# Patient Record
Sex: Male | Born: 1935 | Race: White | Hispanic: No | Smoking: Former smoker
Health system: Southern US, Community
[De-identification: ages and names within clinical notes are randomized; demographics above are authoritative.]

## PROBLEM LIST (undated history)

## (undated) DIAGNOSIS — E782 Mixed hyperlipidemia: Secondary | ICD-10-CM

## (undated) DIAGNOSIS — I10 Essential (primary) hypertension: Secondary | ICD-10-CM

## (undated) DIAGNOSIS — E119 Type 2 diabetes mellitus without complications: Secondary | ICD-10-CM

## (undated) DIAGNOSIS — N184 Chronic kidney disease, stage 4 (severe): Secondary | ICD-10-CM

## (undated) DIAGNOSIS — I350 Nonrheumatic aortic (valve) stenosis: Secondary | ICD-10-CM

## (undated) HISTORY — PX: NO PAST SURGERIES: SHX2092

---

## 2013-01-17 ENCOUNTER — Telehealth: Payer: Self-pay | Admitting: Endocrinology

## 2013-01-17 ENCOUNTER — Other Ambulatory Visit: Payer: Self-pay | Admitting: *Deleted

## 2013-01-17 MED ORDER — LORAZEPAM 1 MG PO TABS: ORAL_TABLET | ORAL | Status: DC

## 2013-01-17 NOTE — Telephone Encounter (Signed)
Dr. Dwyane Dee, okay to fill?

## 2013-01-17 NOTE — Telephone Encounter (Signed)
[facility] pt. Has been out of Lorazepam for several weeks. Wants refilled ASAP, really needs med. Takes 1mg  4 times per day. Uses [facility]. / Venida Jarvis

## 2013-01-17 NOTE — Telephone Encounter (Signed)
We can only give him a 30 day supply with a note to have followup visit since he has not been seen since 2/14

## 2013-01-23 ENCOUNTER — Other Ambulatory Visit: Payer: Self-pay | Admitting: Endocrinology

## 2013-07-07 ENCOUNTER — Ambulatory Visit: Payer: Self-pay | Admitting: Endocrinology

## 2013-07-22 ENCOUNTER — Ambulatory Visit (INDEPENDENT_AMBULATORY_CARE_PROVIDER_SITE_OTHER): Payer: 59 | Admitting: Endocrinology

## 2013-07-22 ENCOUNTER — Encounter: Payer: Self-pay | Admitting: Endocrinology

## 2013-07-22 VITALS — BP 168/66 | HR 65 | Temp 98.6°F | Resp 16 | Ht 71.0 in | Wt 261.8 lb

## 2013-07-22 DIAGNOSIS — E119 Type 2 diabetes mellitus without complications: Secondary | ICD-10-CM

## 2013-07-22 DIAGNOSIS — F332 Major depressive disorder, recurrent severe without psychotic features: Secondary | ICD-10-CM

## 2013-07-22 DIAGNOSIS — E785 Hyperlipidemia, unspecified: Secondary | ICD-10-CM

## 2013-07-22 DIAGNOSIS — N3943 Post-void dribbling: Secondary | ICD-10-CM

## 2013-07-22 DIAGNOSIS — E042 Nontoxic multinodular goiter: Secondary | ICD-10-CM

## 2013-07-22 DIAGNOSIS — I10 Essential (primary) hypertension: Secondary | ICD-10-CM

## 2013-07-22 DIAGNOSIS — F329 Major depressive disorder, single episode, unspecified: Secondary | ICD-10-CM

## 2013-07-22 LAB — URINALYSIS, ROUTINE W REFLEX MICROSCOPIC
Bilirubin Urine: NEGATIVE
KETONES UR: NEGATIVE
Leukocytes, UA: NEGATIVE
Nitrite: NEGATIVE
PH: 5.5 (ref 5.0–8.0)
Specific Gravity, Urine: >=1.03 — AB (ref 1.000–1.030)
TOTAL PROTEIN, URINE-UPE24: 100 — AB
Urine Glucose: NEGATIVE
Urobilinogen, UA: 0.2 (ref 0.0–1.0)
WBC, UA: NONE SEEN (ref 0–?)

## 2013-07-22 LAB — MICROALBUMIN / CREATININE URINE RATIO
Creatinine,U: 77.3 mg/dL
Microalb Creat Ratio: 227.1 mg/g — ABNORMAL HIGH (ref 0.0–30.0)
Microalb, Ur: 175.6 mg/dL — ABNORMAL HIGH (ref 0.0–1.9)

## 2013-07-22 MED ORDER — ESCITALOPRAM OXALATE 10 MG PO TABS: 10.0000 mg | ORAL_TABLET | Freq: Every day | ORAL | Status: DC

## 2013-07-22 MED ORDER — DOXAZOSIN MESYLATE 2 MG PO TABS: 2.0000 mg | ORAL_TABLET | Freq: Every day | ORAL | Status: DC

## 2013-07-22 NOTE — Progress Notes (Signed)
Patient ID: Jacorion Suma, male   DOB: 1935-01-02, 79 y.o.   MRN: [id number]   Reason for Appointment: Diabetes follow-up   History of Present Illness   Diagnosis: Type 2 DIABETES MELITUS, date of diagnosis: 1980      Previous history: In the past he has been on various regimens of oral hypoglycemic drugs including Amaryl, Januvia and Glucophage. Had stopped Januvia because of cost. His blood sugar control has usually been good with A1c usually below 6.5, detailed records are not available  Recent history: Not clear when he was switched from Amaryl to glipizide. Records from last year are not available except from PCP who has seen him in 12/14. He is now returning here for further management He is supposed to be taking metformin twice a day but is taking it only once a day in the morning Does not keep a record of his blood sugars but he thinks they are quite normal without symptoms of hypoglycemia.     Oral hypoglycemic drugs: Glipizide 5 mg in a.m., metformin 500 mg daily         Side effects from medications: None                Monitors blood glucose: Once a day.    Glucometer:  Accu-Chek           Blood Glucose readings from meter download: readings before breakfast: Mostly 70-110, did have one reading in the 60s without symptoms Hypoglycemia: Rare         Meals: 3 meals per day. Eating small portions           Physical activity: exercise: Occasional walking, limited by knee joint pain           Dietician visit: Most recent: Several years ago   Weight control: His weight in 2012 was 297  Wt Readings from Last 3 Encounters:  07/22/13 261 lb 12.8 oz (118.752 kg)         Complications: Neuropathy, proteinuria  Diabetes labs:  No results found for this basename: HGBA1C   No results found for this basename: GLUF,  MICROALBUR,  LDLCALC,  CREATININE   PROBLEM 2: Dribbling of urine     Does have more regular symptoms of dribbling of urine after emptying his bladder at all times,  probably has been going on for several months. Has not mentioned this previously and has not discussed with his PCP  Urinary stream: normal.  Nocturia: 2-3 times.  No hesitancy, frequency during the day or urgency.  Previously had been taking doxazosin 4 mg daily but not clear when he stopped it. Also in 2012 he was prescribed Proscar, not clear if he took it for any length of time  PROBLEM 3: Anxiety. He has had chronic anxiety which has been treated with BuSpar and subsequently lorazepam. He continues to take lorazepam 1 mg 4 times a day but still may get anxious. His sister says that he is significantly depressed. He says he does have low motivation and depressed mood but is sleeping fairly well No suicidal ideation. Previously had taken Lexapro      Medication List       This list is accurate as of: 07/22/13 11:07 AM.  Always use your most recent med list.               amLODipine 10 MG tablet  Commonly known as:  NORVASC     atorvastatin 10 MG tablet  Commonly known as:  LIPITOR  bisoprolol-hydrochlorothiazide 5-6.25 MG per tablet  Commonly known as:  ZIAC     Fish Oil 1000 MG Caps  Take by mouth.     Garlic [id number] MG Tabs  Take by mouth.     glipiZIDE 5 MG tablet  Commonly known as:  GLUCOTROL     lisinopril 40 MG tablet  Commonly known as:  PRINIVIL,ZESTRIL     LORazepam 1 MG tablet  Commonly known as:  ATIVAN  TAKE 1 TABLET BY MOUTH 4 TIMES DAILY.     metFORMIN 500 MG tablet  Commonly known as:  GLUCOPHAGE  Take 500 mg by mouth 2 (two) times daily with a meal.     multivitamin with minerals Tabs tablet  Take 1 tablet by mouth daily.        Allergies: No Known Allergies  No past medical history on file.  No past surgical history on file.  No family history on file.  Social History:  reports that he has never smoked. His smokeless tobacco use includes Chew. His alcohol and drug histories are not on file.  Review of Systems:     Eyes: No visual  problems. Generally has regular exams     ENT: no problems.  Skin: Over the last 2 weeks has had some itching in his lower legs and some in his lower back. Asking about medications causing this. Itching is getting better     Toxic multinodular goiter treated with I131 in 1995.  Goiter has persisted but smaller.  TSH persistently mildly low with previously normal free T4 levels.  No difficulty swallowing or choking     No chest pain on exertion.                 No palpitations.     No swelling of feet.     No recent shortness of breath on exertion. Previous chest x-rays have shown right pleural thickening     Bowel habits: No change         Erectile dysfunction since about 1999.  Currently single and not active.      Has had knee joint pains, less prominent now. Previously had taken hydrocodone for this      No numbness or tingling in hands; occasionally mild tingling in feet; no weakness in legs.       Lipids: LDL is 152 with PCP and 12/14, apparently not taking his atorvastatin at that time     LABS:  No results found for any previous visit.   Examination:  Physical Exam  Vitals reviewed. Constitutional: He appears well-developed and well-nourished.  Neck: Thyromegaly present.  Left lateral lobe is enlarged about 3 times normal, relatively soft and smooth. Right lobe and isthmus about 2 times normal, slightly nodular. Trachea not deviated  Cardiovascular: Regular rhythm.  Exam reveals no gallop.   Murmur heard.  Systolic murmur is present with a grade of 3/6  Murmur present on the upper sternal area bilaterally  Pulmonary/Chest: Breath sounds normal. He has no wheezes. He has no rales.  Abdominal: Soft. Bowel sounds are normal. There is no splenomegaly or hepatomegaly.  Genitourinary: Rectum normal. Prostate is enlarged.  Prostate 2+, smooth, no nodule present  Neurological:  Reflex Scores:      Achilles reflexes are 0 on the right side and 0 on the left  side. Psychiatric: His speech is normal and behavior is normal.  Has a flat affect     BP 128/72  Pulse 65  Temp(Src) 98.6 F (  37 C)  Resp 16  Ht 5\' 11"  (1.803 m)  Wt 261 lb 12.8 oz (118.752 kg)  BMI 36.53 kg/m2  SpO2 97%  Body mass index is 36.53 kg/(m^2).    ASSESSMENT/ PLAN::   1.  Diabetes type 2 with recently good control on glipizide and Glucophage. A1 C. recently upper normal at 5.8. However because of reported relatively low sugars at home and his age we will stop glipizide. He had not been compliant with metformin twice a day and advised him to do so. Also stressed balanced meals and walking as much as possible. Also reminded him to check more readings after meals and bring his monitor  or blood sugar diary for review 2.  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 Not suicidal. He will start back on Lexapro 10 mg daily which apparently had helped him previously 3.  History of peripheral neuropathy, no symptoms currently 4.  Hypertension with history of white coat increase in the office. However since his blood pressure is higher than usual will restart his doxazosin at 2 mg daily. This should help his symptoms of prostatism also.  Advised him to check blood pressure periodically at home, his sister will help him do that. 5.  Long-standing anxiety. Currently taking large doses of lorazepam for this. 6.  Multinodular goiter with low TSH which is reflecting an autonomous thyroid previously had toxic nodular goiter; hyperthyroidism treated  with radioactive iodine. will check free T4 and free T3 levels on his next visit. Currently asymptomatic. Still has significant goiter on the left side but smaller than before  7.  Hypercholesterolemia with recent high LDL from noncompliance with his atorvastatin. He did this regularly and emphasized the importance of doing this. 8.  Osteoarthritis knees with chronic pain, he is reasonably  comfortable now and not requiring narcotic analgesics  9. Increasing symptoms of benign prostatic hypertrophy. His postvoid urinary incontinence may be symptom of BPH and will see if he benefits from doxazosin. Consider neurology consultation if not better 10. Hypoproteinemia. Not clear if he may have some degree of nephrotic syndrome or whether his low albumin is nutritional. Advised him to start increasing protein at meals 11. Diabetic nephropathy, mild with history of high urine microalbumin, to check again today. Also needs more optimal blood pressure control 12. Pruritus on legs, etiology unclear. Since symptomatically he is doing better will not evaluate further. Consider dermatology consultation he has a rash 13. Obesity, especially abdominal.  Has had metabolic syndrome with low HDL and hypertension, has lost weight over the last year. 14. History of  first degree AV block   Yesli Vanderhoff 07/22/2013, 11:07 AM

## 2013-07-22 NOTE — Patient Instructions (Addendum)
Increase protein in diet, have a low fat protein from meter or low fat dairy product at each meal  Please check blood sugars at least half the time about 2 hours after any meal and as directed on waking up. Please bring blood sugar monitor to each visit  Stop glipizide Increase metformin to 500 mg twice a day  Start doxazosin 2 mg in the evening daily for blood pressure and prostate Baby aspirin 81 mg daily  Start Lexapro 10 mg daily for depression

## 2013-07-23 DIAGNOSIS — E785 Hyperlipidemia, unspecified: Secondary | ICD-10-CM | POA: Insufficient documentation

## 2013-07-23 DIAGNOSIS — E119 Type 2 diabetes mellitus without complications: Secondary | ICD-10-CM | POA: Insufficient documentation

## 2013-07-23 DIAGNOSIS — F332 Major depressive disorder, recurrent severe without psychotic features: Secondary | ICD-10-CM | POA: Insufficient documentation

## 2013-07-23 DIAGNOSIS — N3943 Post-void dribbling: Secondary | ICD-10-CM | POA: Insufficient documentation

## 2013-07-23 DIAGNOSIS — I10 Essential (primary) hypertension: Secondary | ICD-10-CM | POA: Insufficient documentation

## 2013-07-23 DIAGNOSIS — E042 Nontoxic multinodular goiter: Secondary | ICD-10-CM | POA: Insufficient documentation

## 2013-08-20 ENCOUNTER — Other Ambulatory Visit: Payer: 59

## 2013-08-20 ENCOUNTER — Ambulatory Visit: Payer: 59 | Admitting: Endocrinology

## 2013-09-09 ENCOUNTER — Encounter: Payer: Self-pay | Admitting: Endocrinology

## 2013-09-09 ENCOUNTER — Ambulatory Visit (INDEPENDENT_AMBULATORY_CARE_PROVIDER_SITE_OTHER): Payer: 59 | Admitting: Endocrinology

## 2013-09-09 ENCOUNTER — Other Ambulatory Visit: Payer: Self-pay | Admitting: Endocrinology

## 2013-09-09 ENCOUNTER — Other Ambulatory Visit: Payer: 59

## 2013-09-09 VITALS — BP 142/68 | HR 51 | Temp 97.9°F | Resp 16 | Ht 71.0 in | Wt 263.2 lb

## 2013-09-09 DIAGNOSIS — I10 Essential (primary) hypertension: Secondary | ICD-10-CM

## 2013-09-09 DIAGNOSIS — E1165 Type 2 diabetes mellitus with hyperglycemia: Secondary | ICD-10-CM

## 2013-09-09 DIAGNOSIS — IMO0001 Reserved for inherently not codable concepts without codable children: Secondary | ICD-10-CM

## 2013-09-09 DIAGNOSIS — F332 Major depressive disorder, recurrent severe without psychotic features: Secondary | ICD-10-CM

## 2013-09-09 DIAGNOSIS — F329 Major depressive disorder, single episode, unspecified: Secondary | ICD-10-CM

## 2013-09-09 DIAGNOSIS — N3943 Post-void dribbling: Secondary | ICD-10-CM

## 2013-09-09 LAB — COMPREHENSIVE METABOLIC PANEL
ALT: 13 U/L (ref 0–53)
AST: 18 U/L (ref 0–37)
Albumin: 3.2 g/dL — ABNORMAL LOW (ref 3.5–5.2)
Alkaline Phosphatase: 61 U/L (ref 39–117)
BILIRUBIN TOTAL: 0.4 mg/dL (ref 0.2–1.2)
BUN: 18 mg/dL (ref 6–23)
CO2: 29 mEq/L (ref 19–32)
CREATININE: 1.08 mg/dL (ref 0.50–1.35)
Calcium: 8.2 mg/dL — ABNORMAL LOW (ref 8.4–10.5)
Chloride: 105 mEq/L (ref 96–112)
GLUCOSE: 112 mg/dL — AB (ref 70–99)
Potassium: 4 mEq/L (ref 3.5–5.3)
Sodium: 140 mEq/L (ref 135–145)
Total Protein: 5.5 g/dL — ABNORMAL LOW (ref 6.0–8.3)

## 2013-09-09 LAB — LIPID PANEL
CHOLESTEROL: 160 mg/dL (ref 0–200)
HDL: 43 mg/dL (ref >39)
LDL Cholesterol: 95 mg/dL (ref 0–99)
TRIGLYCERIDES: 112 mg/dL (ref <150)
Total CHOL/HDL Ratio: 3.7 Ratio
VLDL: 22 mg/dL (ref 0–40)

## 2013-09-09 LAB — T4, FREE: Free T4: 1.17 ng/dL (ref 0.80–1.80)

## 2013-09-09 LAB — TSH: TSH: 0.111 u[IU]/mL — ABNORMAL LOW (ref 0.350–4.500)

## 2013-09-09 LAB — T3, FREE: T3, Free: 3.2 pg/mL (ref 2.3–4.2)

## 2013-09-09 NOTE — Patient Instructions (Signed)
Bladder training  Lexapro 20mg  daily  Walk daily

## 2013-09-09 NOTE — Progress Notes (Signed)
Patient ID: Billy [name], male   DOB: 1934-09-24, 79 y.o.   MRN: [id number]   Reason for Appointment: Diabetes follow-up   History of Present Illness   Diagnosis: Type 2 DIABETES MELITUS, date of diagnosis: 1980      Previous history: In the past he has been on various regimens of oral hypoglycemic drugs including Amaryl, Januvia and Glucophage. Had stopped Januvia because of cost. His blood sugar control has usually been good with A1c usually below 6.5, detailed records are not available He previously was weighing about 300 pounds and has lost weight over the last couple of years  Recent history: On his last visit his glipizide was stopped because of low normal blood sugars Also his metformin was increased to twice a 500 mg once a day this his blood sugars are reasonably well-controlled Occasionally will have higher readings in the afternoons which he thinks is from eating sweets like oatmeal cookies Currently not active     Oral hypoglycemic drugs: metformin 500 mg twice a day         Side effects from medications: None                Monitors blood glucose: Once a day.    Glucometer:  Accu-Chek           Blood Glucose readings from meter download:   PREMEAL Breakfast Lunch  afternoon   7 PM  Overall  Glucose range:  87-149   137-165   140-191   92, 134    Mean/median:    183    124    Hypoglycemia: None recently         Meals: 3 meals per day. Eating small portions           Physical activity: exercise: Occasional walking, limited by knee joint pain           Dietician visit: Most recent: Several years ago   Weight control: His weight in 2012 was 297  Wt Readings from Last 3 Encounters:  09/09/13 263 lb 3.2 oz (119.387 kg)  07/22/13 261 lb 12.8 oz (118.752 kg)         Complications: Neuropathy, proteinuria  Diabetes labs:  From PCP:A1 C. was upper normal at 5.8 in 12/14.   No results found for this basename: HGBA1C   No results found for this basename: GLUF,   MICROALBUR,  LDLCALC,  CREATININE    PROBLEM 2: Urinary difficulty    On his last visit he was complaining about  symptoms of dribbling of urine after emptying his bladder at all times, probably has been going on for several months. Has not mentioned this previously and has not discussed with his PCP  Urinary stream: normal.  Nocturia: 2-3 times.  No hesitancy, frequency during the day or urgency.  Does not think he is significantly better with restarting doxazosin which had been also taking Also in 2012 he was prescribed Proscar, not clear if he took it for any length of time   PROBLEM 3: Depression and Anxiety. He has had chronic anxiety which has been treated with BuSpar and subsequently lorazepam.  He continues to take lorazepam 1 mg 4 times a day but still may get anxious. Because of depression she was restarted on Lexapro which she had taken previously His sister thinks that he is slightly better but still gets depressed Has low level of motivation and also some late insomnia       Medication List  This list is accurate as of: 09/09/13  9:24 PM.  Always use your most recent med list.               amLODipine 10 MG tablet  Commonly known as:  NORVASC     atorvastatin 10 MG tablet  Commonly known as:  LIPITOR     bisoprolol-hydrochlorothiazide 5-6.25 MG per tablet  Commonly known as:  ZIAC     doxazosin 2 MG tablet  Commonly known as:  CARDURA  Take 1 tablet (2 mg total) by mouth at bedtime.     escitalopram 10 MG tablet  Commonly known as:  LEXAPRO  Take 1 tablet (10 mg total) by mouth daily.     lisinopril 40 MG tablet  Commonly known as:  PRINIVIL,ZESTRIL     LORazepam 1 MG tablet  Commonly known as:  ATIVAN  TAKE 1 TABLET BY MOUTH 4 TIMES DAILY.     metFORMIN 500 MG tablet  Commonly known as:  GLUCOPHAGE  Take 500 mg by mouth 2 (two) times daily with a meal.     multivitamin with minerals Tabs tablet  Take 1 tablet by mouth daily.         Allergies: No Known Allergies  No past medical history on file.  No past surgical history on file.  No family history on file.  Social History:  reports that he has never smoked. His smokeless tobacco use includes Chew. His alcohol and drug histories are not on file.  Review of Systems:  Hypertension: He had previously required multiple medications. On his last visit his blood pressure was high with regimen of lisinopril, amlodipine and Ziac he was given doxazosin 2 mg also He thinks blood pressure is somewhat better at home        Toxic multinodular goiter treated with I131 in 1995.  Goiter has persisted but smaller.  TSH persistently mildly low with previously normal free T4 levels.  No difficulty swallowing or choking      Has had knee joint pains, less prominent now. Previously had taken hydrocodone for this       Lipids: LDL is 152 with PCP in 12/14, apparently not taking his atorvastatin at that time     LABS:  No visits with results within 1 Week(s) from this visit. Latest known visit with results is:  Office Visit on 07/22/2013  Component Date Value Ref Range Status  . Microalb, Ur 07/22/2013 175.6 Verified by manual dilution.* 0.0 - 1.9 mg/dL Final  . Creatinine,U 07/22/2013 77.3   Final  . Microalb Creat Ratio 07/22/2013 227.1* 0.0 - 30.0 mg/g Final  . Color, Urine 07/22/2013 YELLOW  Yellow;Lt. Yellow Final  . APPearance 07/22/2013 CLEAR  Clear Final  . Specific Gravity, Urine 07/22/2013 >=1.030* 1.000 - 1.030 Final  . pH 07/22/2013 5.5  5.0 - 8.0 Final  . Total Protein, Urine 07/22/2013 100* Negative Final  . Urine Glucose 07/22/2013 NEGATIVE  Negative Final  . Ketones, ur 07/22/2013 NEGATIVE  Negative Final  . Bilirubin Urine 07/22/2013 NEGATIVE  Negative Final  . Hgb urine dipstick 07/22/2013 SMALL* Negative Final  . Urobilinogen, UA 07/22/2013 0.2  0.0 - 1.0 Final  . Leukocytes, UA 07/22/2013 NEGATIVE  Negative Final  . Nitrite 07/22/2013 NEGATIVE   Negative Final  . WBC, UA 07/22/2013 none seen  0-2/hpf Final  . RBC / HPF 07/22/2013 0-2/hpf  0-2/hpf Final  . Granular Casts, UA 07/22/2013 Presence of* None Final  . Hyaline Casts, UA 07/22/2013 Presence of*  None Final     Examination:  Physical Exam   BP 142/68  Pulse 51  Temp(Src) 97.9 F (36.6 C)  Resp 16  Ht 5\' 11"  (1.803 m)  Wt 263 lb 3.2 oz (119.387 kg)  BMI 36.73 kg/m2  SpO2 97%  Body mass index is 36.73 kg/(m^2).   No ankle edema seen  ASSESSMENT/ PLAN:   1.  Diabetes type 2 with fairly good control on metformin alone.  He is having a few high readings in the afternoons only which is related to inconsistent diet Otherwise his overall blood sugar averaging 124 Will check his A1c on his next visit Encouraged him to start walking more regularly  2.  Endogenous depression. He has less symptoms recently with starting Lexapro 10 mg daily  However his family thinks he still has some symptoms and because of late insomnia will try to increase it to 20 mg daily  3.  Hypertension requiring multiple medications. Today blood pressure is better with adding doxazosin at 2 mg daily.  Encouraged him to check blood pressure at home regularly  4.  He is still having post-void urinary incontinence  despite starting doxazosin. He does not want to try any other medication at this time or urology consultation. Does not have urinary obstructive symptoms. Given him brochure on urinary incontinence and encouraged him to start doing  pelvic  Exercises and regular voiding    5.  Long-standing anxiety. Currently taking  Lexapro and  lorazepam  with some improvement  6.  Multinodular goiter with low TSH which is reflecting an autonomous thyroid previously had toxic nodular goiter; hyperthyroidism treated  with radioactive iodine. Will check free T4 and free T3 levels on his next visit. Currently asymptomatic, no tachycardia.   7.  Hypercholesterolemia, he was told to restart his atorvastatin.  Will need followup levels   total visit time including counseling = 25 minutes   Vernestine Brodhead 09/09/2013, 9:24 PM

## 2013-10-22 ENCOUNTER — Telehealth: Payer: Self-pay | Admitting: Endocrinology

## 2013-10-22 ENCOUNTER — Other Ambulatory Visit: Payer: Self-pay | Admitting: *Deleted

## 2013-10-22 MED ORDER — GLUCOSE BLOOD VI STRP: ORAL_STRIP | Status: DC

## 2013-10-22 NOTE — Telephone Encounter (Signed)
rx sent

## 2013-10-22 NOTE — Telephone Encounter (Signed)
Call in rx for test strips he is completely out

## 2013-10-28 ENCOUNTER — Telehealth: Payer: Self-pay | Admitting: Endocrinology

## 2013-10-28 NOTE — Telephone Encounter (Signed)
Patients sister called per our conversation Suanne Marker  Regarding pharmacy to fax over approval from a mail order pharmacy for free meter and test strips  Please Advise patient   Call back: [phone] or [phone]  Thank you :)

## 2013-11-17 ENCOUNTER — Other Ambulatory Visit: Payer: Self-pay | Admitting: Endocrinology

## 2013-11-24 ENCOUNTER — Other Ambulatory Visit: Payer: Self-pay | Admitting: *Deleted

## 2013-11-24 ENCOUNTER — Other Ambulatory Visit: Payer: Self-pay | Admitting: Endocrinology

## 2013-11-24 MED ORDER — ATORVASTATIN CALCIUM 10 MG PO TABS: 10.0000 mg | ORAL_TABLET | Freq: Every day | ORAL | Status: DC

## 2013-11-24 MED ORDER — LISINOPRIL 40 MG PO TABS: 40.0000 mg | ORAL_TABLET | Freq: Every day | ORAL | Status: DC

## 2013-11-24 MED ORDER — BISOPROLOL-HYDROCHLOROTHIAZIDE 5-6.25 MG PO TABS: 1.0000 | ORAL_TABLET | Freq: Every day | ORAL | Status: DC

## 2013-11-24 MED ORDER — LORAZEPAM 1 MG PO TABS: ORAL_TABLET | ORAL | Status: DC

## 2013-11-28 ENCOUNTER — Other Ambulatory Visit: Payer: Self-pay | Admitting: Endocrinology

## 2013-12-05 ENCOUNTER — Other Ambulatory Visit: Payer: Self-pay | Admitting: *Deleted

## 2013-12-05 MED ORDER — AMLODIPINE BESYLATE 10 MG PO TABS: 10.0000 mg | ORAL_TABLET | Freq: Every day | ORAL | Status: DC

## 2013-12-11 ENCOUNTER — Encounter: Payer: Self-pay | Admitting: Endocrinology

## 2013-12-11 ENCOUNTER — Ambulatory Visit (INDEPENDENT_AMBULATORY_CARE_PROVIDER_SITE_OTHER): Payer: 59 | Admitting: Endocrinology

## 2013-12-11 ENCOUNTER — Other Ambulatory Visit: Payer: Self-pay | Admitting: *Deleted

## 2013-12-11 VITALS — BP 122/68 | HR 62 | Temp 98.1°F | Resp 16 | Ht 71.0 in | Wt 276.6 lb

## 2013-12-11 DIAGNOSIS — IMO0002 Reserved for concepts with insufficient information to code with codable children: Secondary | ICD-10-CM

## 2013-12-11 DIAGNOSIS — R609 Edema, unspecified: Secondary | ICD-10-CM

## 2013-12-11 DIAGNOSIS — E785 Hyperlipidemia, unspecified: Secondary | ICD-10-CM

## 2013-12-11 DIAGNOSIS — M170 Bilateral primary osteoarthritis of knee: Secondary | ICD-10-CM

## 2013-12-11 DIAGNOSIS — M1710 Unilateral primary osteoarthritis, unspecified knee: Secondary | ICD-10-CM

## 2013-12-11 DIAGNOSIS — E1165 Type 2 diabetes mellitus with hyperglycemia: Secondary | ICD-10-CM

## 2013-12-11 DIAGNOSIS — E042 Nontoxic multinodular goiter: Secondary | ICD-10-CM

## 2013-12-11 DIAGNOSIS — I10 Essential (primary) hypertension: Secondary | ICD-10-CM

## 2013-12-11 DIAGNOSIS — M1712 Unilateral primary osteoarthritis, left knee: Secondary | ICD-10-CM

## 2013-12-11 DIAGNOSIS — E119 Type 2 diabetes mellitus without complications: Secondary | ICD-10-CM

## 2013-12-11 DIAGNOSIS — IMO0001 Reserved for inherently not codable concepts without codable children: Secondary | ICD-10-CM

## 2013-12-11 DIAGNOSIS — M1711 Unilateral primary osteoarthritis, right knee: Secondary | ICD-10-CM

## 2013-12-11 LAB — T4, FREE: FREE T4: 0.98 ng/dL (ref 0.60–1.60)

## 2013-12-11 LAB — COMPREHENSIVE METABOLIC PANEL
ALBUMIN: 3.1 g/dL — AB (ref 3.5–5.2)
ALT: 14 U/L (ref 0–53)
AST: 23 U/L (ref 0–37)
Alkaline Phosphatase: 49 U/L (ref 39–117)
BUN: 20 mg/dL (ref 6–23)
CO2: 29 meq/L (ref 19–32)
Calcium: 8.4 mg/dL (ref 8.4–10.5)
Chloride: 105 mEq/L (ref 96–112)
Creatinine, Ser: 1.2 mg/dL (ref 0.4–1.5)
GFR: 64.24 mL/min (ref >60.00)
GLUCOSE: 127 mg/dL — AB (ref 70–99)
POTASSIUM: 3.9 meq/L (ref 3.5–5.1)
Sodium: 140 mEq/L (ref 135–145)
TOTAL PROTEIN: 5.8 g/dL — AB (ref 6.0–8.3)
Total Bilirubin: 0.5 mg/dL (ref 0.2–1.2)

## 2013-12-11 LAB — HEMOGLOBIN A1C: Hgb A1c MFr Bld: 6.5 % (ref 4.6–6.5)

## 2013-12-11 LAB — LIPID PANEL
CHOLESTEROL: 168 mg/dL (ref 0–200)
HDL: 34.6 mg/dL — ABNORMAL LOW (ref >39.00)
LDL Cholesterol: 102 mg/dL — ABNORMAL HIGH (ref 0–99)
NonHDL: 133.4
TRIGLYCERIDES: 155 mg/dL — AB (ref 0.0–149.0)
Total CHOL/HDL Ratio: 5
VLDL: 31 mg/dL (ref 0.0–40.0)

## 2013-12-11 LAB — TSH: TSH: 0.05 u[IU]/mL — AB (ref 0.35–4.50)

## 2013-12-11 LAB — T3, FREE: T3 FREE: 2.4 pg/mL (ref 2.3–4.2)

## 2013-12-11 MED ORDER — GLUCOSE BLOOD VI STRP: ORAL_STRIP | Status: DC

## 2013-12-11 MED ORDER — LISINOPRIL 40 MG PO TABS: 40.0000 mg | ORAL_TABLET | Freq: Every day | ORAL | Status: DC

## 2013-12-11 MED ORDER — AMLODIPINE BESYLATE 5 MG PO TABS: 5.0000 mg | ORAL_TABLET | Freq: Every day | ORAL | Status: DC

## 2013-12-11 MED ORDER — ATORVASTATIN CALCIUM 10 MG PO TABS: 10.0000 mg | ORAL_TABLET | Freq: Every day | ORAL | Status: DC

## 2013-12-11 MED ORDER — LORAZEPAM 1 MG PO TABS: ORAL_TABLET | ORAL | Status: DC

## 2013-12-11 MED ORDER — ESCITALOPRAM OXALATE 10 MG PO TABS: ORAL_TABLET | ORAL | Status: DC

## 2013-12-11 MED ORDER — DOXAZOSIN MESYLATE 2 MG PO TABS: ORAL_TABLET | ORAL | Status: DC

## 2013-12-11 MED ORDER — METFORMIN HCL 500 MG PO TABS: 500.0000 mg | ORAL_TABLET | Freq: Two times a day (BID) | ORAL | Status: DC

## 2013-12-11 MED ORDER — BUPROPION HCL ER (XL) 150 MG PO TB24: 150.0000 mg | ORAL_TABLET | Freq: Every day | ORAL | Status: DC

## 2013-12-11 MED ORDER — BISOPROLOL-HYDROCHLOROTHIAZIDE 5-6.25 MG PO TABS: 1.0000 | ORAL_TABLET | Freq: Every day | ORAL | Status: DC

## 2013-12-11 NOTE — Patient Instructions (Addendum)
Reduce amlodipine to 1/2 daily  Wals daily

## 2013-12-11 NOTE — Progress Notes (Addendum)
Patient ID: Billy [name], male   DOB: Jan 04, 1935, 79 y.o.   MRN: [id number]   Reason for Appointment: Diabetes follow-up   History of Present Illness   Diagnosis: Type 2 DIABETES MELITUS, date of diagnosis: 1980      Previous history: In the past he has been on various regimens of oral hypoglycemic drugs including Amaryl, Januvia and Glucophage. Had stopped Januvia because of cost. His blood sugar control has usually been good with A1c usually below 6.5, detailed records are not available He previously was weighing about 300 pounds and has lost weight over the last couple of years  Recent history: He was previously on glipizide which was stopped because of low normal blood sugars Also his metformin was increased to twice a 500 mg once a day this his blood sugars are reasonably well-controlled Occasionally will have higher readings after meals which he thinks is from eating sweets like oatmeal cookies Currently not active, no motivation     Oral hypoglycemic drugs: metformin 500 mg twice a day         Side effects from medications: None                Monitors blood glucose: Once a day.    Glucometer:  Accu-Chek           Blood Glucose readings from meter download:   Overall average 137 Fasting readings 89-157 and nonfasting 104-186  Hypoglycemia: None recently         Meals: 3 meals per day. Eating small portions           Physical activity: exercise: Occasional walking, limited by knee joint pain           Dietician visit: Most recent: Several years ago   Weight control: His weight in 2012 was 297  Wt Readings from Last 3 Encounters:  12/11/13 276 lb 9.6 oz (125.465 kg)  09/09/13 263 lb 3.2 oz (119.387 kg)  07/22/13 261 lb 12.8 oz (118.752 kg)         Complications: Neuropathy, proteinuria  Diabetes labs:  From PCP:A1 C. was upper normal at 5.8 in 12/14.   No results found for this basename: HGBA1C   No results found for this basename: GLUF,  MICROALBUR,  LDLCALC,   CREATININE     PROBLEM #2: Depression and Anxiety. He has had chronic anxiety which has been treated with BuSpar and subsequently lorazepam.  He continues to take lorazepam 1 mg 4 times a day  Because of depression he has been on Lexapro for several months His sister thinks that he is slightly better but still gets depressed and does not do anything during the day, usually not getting out at all even though he can walk Has low level of motivation and also some late insomnia       Medication List       This list is accurate as of: 12/11/13  8:44 AM.  Always use your most recent med list.               amLODipine 10 MG tablet  Commonly known as:  NORVASC  Take 1 tablet (10 mg total) by mouth daily.     atorvastatin 10 MG tablet  Commonly known as:  LIPITOR  Take 1 tablet (10 mg total) by mouth daily.     bisoprolol-hydrochlorothiazide 5-6.25 MG per tablet  Commonly known as:  ZIAC  Take 1 tablet by mouth daily.     doxazosin 2 MG tablet  Commonly known as:  CARDURA  TAKE ONE TABLET BY MOUTH AT BEDTIME     escitalopram 10 MG tablet  Commonly known as:  LEXAPRO  TAKE ONE TABLET BY MOUTH ONCE DAILY     glucose blood test strip  Commonly known as:  ACCU-CHEK AVIVA  Use as instructed to check blood sugar 1 time per day dx code 250.00     lisinopril 40 MG tablet  Commonly known as:  PRINIVIL,ZESTRIL  Take 1 tablet (40 mg total) by mouth daily.     LORazepam 1 MG tablet  Commonly known as:  ATIVAN  TAKE 1 TABLET BY MOUTH 4 TIMES DAILY.     metFORMIN 500 MG tablet  Commonly known as:  GLUCOPHAGE  Take 500 mg by mouth 2 (two) times daily with a meal.     multivitamin with minerals Tabs tablet  Take 1 tablet by mouth daily.        Allergies: No Known Allergies  No past medical history on file.  No past surgical history on file.  No family history on file.  Social History:  reports that he has never smoked. His smokeless tobacco use includes Chew. His  alcohol and drug histories are not on file.  Review of Systems:  Hypertension: He had previously required multiple medications. On a previous visit  his blood pressure was high with regimen of lisinopril, amlodipine and Ziac he was given doxazosin 2 mg also He has not monitored his blood pressure at home although his sister can do this. No lightheadedness  He has gained some weight        Toxic multinodular goiter treated with I131 in 1995.  Goiter has persisted but smaller.  TSH persistently mildly low with previously normal free T4 levels.  No difficulty swallowing or choking      Has had knee joint pains, still asking about the pain. Previously had taken hydrocodone for this       Lipids: This is being treated with Lipitor     LABS:  No visits with results within 1 Week(s) from this visit. Latest known visit with results is:  Orders Only on 09/09/2013  Component Date Value Ref Range Status  . Sodium 09/09/2013 140  135 - 145 mEq/L Final  . Potassium 09/09/2013 4.0  3.5 - 5.3 mEq/L Final  . Chloride 09/09/2013 105  96 - 112 mEq/L Final  . CO2 09/09/2013 29  19 - 32 mEq/L Final  . Glucose, Bld 09/09/2013 112* 70 - 99 mg/dL Final  . BUN 09/09/2013 18  6 - 23 mg/dL Final  . Creat 09/09/2013 1.08  0.50 - 1.35 mg/dL Final  . Total Bilirubin 09/09/2013 0.4  0.2 - 1.2 mg/dL Final  . Alkaline Phosphatase 09/09/2013 61  39 - 117 U/L Final  . AST 09/09/2013 18  0 - 37 U/L Final  . ALT 09/09/2013 13  0 - 53 U/L Final  . Total Protein 09/09/2013 5.5* 6.0 - 8.3 g/dL Final  . Albumin 09/09/2013 3.2* 3.5 - 5.2 g/dL Final  . Calcium 09/09/2013 8.2* 8.4 - 10.5 mg/dL Final  . Cholesterol 09/09/2013 160  0 - 200 mg/dL Final   Comment: ATP III Classification:                                < 200        mg/dL        Desirable  200 - 239     mg/dL        Borderline High                               >= 240        mg/dL        High                             .  Triglycerides 09/09/2013 112  <150 mg/dL Final  . HDL 09/09/2013 43  >39 mg/dL Final  . Total CHOL/HDL Ratio 09/09/2013 3.7   Final  . VLDL 09/09/2013 22  0 - 40 mg/dL Final  . LDL Cholesterol 09/09/2013 95  0 - 99 mg/dL Final   Comment:                            Total Cholesterol/HDL Ratio:CHD Risk                                                 Coronary Heart Disease Risk Table                                                                 Men       Women                                   1/2 Average Risk              3.4        3.3                                       Average Risk              5.0        4.4                                    2X Average Risk              9.6        7.1                                    3X Average Risk             23.4       11.0                          Use the calculated Patient Ratio above and the CHD Risk table  to determine the patient's CHD Risk.                          ATP III Classification (LDL):                                < 100        mg/dL         Optimal                               100 - 129     mg/dL         Near or Above Optimal                               130 - 159     mg/dL         Borderline High                               160 - 189     mg/dL         High                                > 190        mg/dL         Very High                             . TSH 09/09/2013 0.111* 0.350 - 4.500 uIU/mL Final  . Free T4 09/09/2013 1.17  0.80 - 1.80 ng/dL Final  . T3, Free 09/09/2013 3.2  2.3 - 4.2 pg/mL Final     Examination:  Physical Exam   BP 122/68  Pulse 62  Temp(Src) 98.1 F (36.7 C)  Resp 16  Ht 5\' 11"  (1.803 m)  Wt 276 lb 9.6 oz (125.465 kg)  BMI 38.60 kg/m2  SpO2 95%  Body mass index is 38.6 kg/(m^2).   He has 2+ ankle edema He has a flat affect  ASSESSMENT/ PLAN:   1.  Diabetes type 2 with fairly good control on metformin alone.  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 Will start with 150 mg daily and continue Lexapro unchanged  3.  Hypertension requiring multiple medications. Overall blood pressure is better with adding doxazosin  However it may be relatively low for him and also he seems to be getting edema, likely to be from amlodipine 10 mg dose His sister will try to check his blood pressure periodically Meanwhile will reduce his amlodipine to half a  tablet  4.   Hypercholesterolemia: We'll need to check his lipid levels and also will make sure he is compliant with his Lipitor  5. Osteoarthritis of knees and pain. Not clear if he is not vomiting because of the pain or lack of motivation. His sister is requesting physical therapy consultation and will arrange this    total visit time including counseling = 25 minutes   KUMAR,AJAY 12/11/2013, 8:44 AM

## 2013-12-22 ENCOUNTER — Telehealth: Payer: Self-pay | Admitting: *Deleted

## 2013-12-22 NOTE — Telephone Encounter (Signed)
Patients sister called, she said you reduced his amlodipine at last visit and his blood pressure has been running in the 200's, sister says she started him back on the 10 mg and wants to know if that's ok?

## 2013-12-22 NOTE — Telephone Encounter (Signed)
Ok, need to see him back for BP in about 3 weeks

## 2013-12-29 ENCOUNTER — Ambulatory Visit: Payer: Medicare Other | Attending: Endocrinology | Admitting: Physical Therapy

## 2013-12-29 DIAGNOSIS — R269 Unspecified abnormalities of gait and mobility: Secondary | ICD-10-CM | POA: Diagnosis not present

## 2013-12-29 DIAGNOSIS — M1710 Unilateral primary osteoarthritis, unspecified knee: Secondary | ICD-10-CM | POA: Diagnosis not present

## 2013-12-29 DIAGNOSIS — Z5189 Encounter for other specified aftercare: Secondary | ICD-10-CM | POA: Insufficient documentation

## 2014-01-08 ENCOUNTER — Other Ambulatory Visit: Payer: Self-pay | Admitting: *Deleted

## 2014-01-08 ENCOUNTER — Telehealth: Payer: Self-pay | Admitting: Endocrinology

## 2014-01-08 MED ORDER — ESCITALOPRAM OXALATE 10 MG PO TABS
ORAL_TABLET | ORAL | Status: DC
Start: 2014-01-08 — End: 2018-10-17

## 2014-01-08 MED ORDER — LISINOPRIL 40 MG PO TABS: 40.0000 mg | ORAL_TABLET | Freq: Every day | ORAL | Status: DC

## 2014-01-08 MED ORDER — AMLODIPINE BESYLATE 10 MG PO TABS: 10.0000 mg | ORAL_TABLET | Freq: Every day | ORAL | Status: DC

## 2014-01-08 MED ORDER — ATORVASTATIN CALCIUM 10 MG PO TABS: 10.0000 mg | ORAL_TABLET | Freq: Every day | ORAL | Status: DC

## 2014-01-08 MED ORDER — METFORMIN HCL 500 MG PO TABS: 500.0000 mg | ORAL_TABLET | Freq: Two times a day (BID) | ORAL | Status: DC

## 2014-01-08 MED ORDER — GLUCOSE BLOOD VI STRP: ORAL_STRIP | Status: DC

## 2014-01-08 MED ORDER — BUPROPION HCL ER (XL) 150 MG PO TB24: 150.0000 mg | ORAL_TABLET | Freq: Every day | ORAL | Status: DC

## 2014-01-08 MED ORDER — BISOPROLOL-HYDROCHLOROTHIAZIDE 5-6.25 MG PO TABS: 1.0000 | ORAL_TABLET | Freq: Every day | ORAL | Status: DC

## 2014-01-08 MED ORDER — LORAZEPAM 1 MG PO TABS: ORAL_TABLET | ORAL | Status: DC

## 2014-01-08 MED ORDER — DOXAZOSIN MESYLATE 2 MG PO TABS: ORAL_TABLET | ORAL | Status: DC

## 2014-01-08 NOTE — Telephone Encounter (Signed)
rx's sent for 90 day supply

## 2014-01-08 NOTE — Telephone Encounter (Signed)
Pt needs 90 day supply on ALL RXs  Amlodipine needs rx for 10 mg and the Atorvastation needs 10 mg [facility] is giving a hard time

## 2014-01-09 ENCOUNTER — Ambulatory Visit: Payer: Medicare Other | Admitting: Physical Therapy

## 2014-01-09 DIAGNOSIS — Z5189 Encounter for other specified aftercare: Secondary | ICD-10-CM | POA: Diagnosis not present

## 2014-01-12 ENCOUNTER — Other Ambulatory Visit: Payer: Self-pay | Admitting: *Deleted

## 2014-01-12 ENCOUNTER — Telehealth: Payer: Self-pay | Admitting: Endocrinology

## 2014-01-12 MED ORDER — ATORVASTATIN CALCIUM 20 MG PO TABS: 20.0000 mg | ORAL_TABLET | Freq: Every day | ORAL | Status: DC

## 2014-01-12 NOTE — Telephone Encounter (Signed)
Please advise, I did not find in your notes that you had increased it.

## 2014-01-12 NOTE — Telephone Encounter (Signed)
Patients sister called stating that the pharmacy told her that his Cholesterol medication atorvastatin 10mg  can not be filled till 01/14/14 Dr. Dwyane Dee increased to 2x daily   Please advise patient  Thank You

## 2014-01-12 NOTE — Telephone Encounter (Signed)
Change to 20 mg

## 2014-01-12 NOTE — Telephone Encounter (Signed)
rx sent

## 2014-01-15 ENCOUNTER — Ambulatory Visit: Payer: Medicare Other | Admitting: Physical Therapy

## 2014-01-15 DIAGNOSIS — Z5189 Encounter for other specified aftercare: Secondary | ICD-10-CM | POA: Diagnosis not present

## 2014-01-30 ENCOUNTER — Ambulatory Visit (INDEPENDENT_AMBULATORY_CARE_PROVIDER_SITE_OTHER): Payer: 59 | Admitting: Endocrinology

## 2014-01-30 ENCOUNTER — Encounter: Payer: Self-pay | Admitting: Endocrinology

## 2014-01-30 VITALS — BP 162/78 | HR 69 | Temp 98.5°F | Resp 16 | Ht 71.0 in | Wt 278.8 lb

## 2014-01-30 DIAGNOSIS — I10 Essential (primary) hypertension: Secondary | ICD-10-CM

## 2014-01-30 DIAGNOSIS — E785 Hyperlipidemia, unspecified: Secondary | ICD-10-CM

## 2014-01-30 DIAGNOSIS — E119 Type 2 diabetes mellitus without complications: Secondary | ICD-10-CM

## 2014-01-30 LAB — BASIC METABOLIC PANEL
BUN: 19 mg/dL (ref 6–23)
CALCIUM: 8.4 mg/dL (ref 8.4–10.5)
CO2: 28 mEq/L (ref 19–32)
Chloride: 103 mEq/L (ref 96–112)
Creatinine, Ser: 1.8 mg/dL — ABNORMAL HIGH (ref 0.4–1.5)
GFR: 40.35 mL/min — ABNORMAL LOW (ref >60.00)
Glucose, Bld: 145 mg/dL — ABNORMAL HIGH (ref 70–99)
Potassium: 4.1 mEq/L (ref 3.5–5.1)
Sodium: 138 mEq/L (ref 135–145)

## 2014-01-30 LAB — LDL CHOLESTEROL, DIRECT: LDL DIRECT: 97.2 mg/dL

## 2014-01-30 NOTE — Patient Instructions (Signed)
Call BP reading in 2 weeks if over 150

## 2014-01-30 NOTE — Progress Notes (Signed)
Patient ID: Billy [name], male   DOB: 06-Aug-1934, 78 y.o.   MRN: [id number]   Reason for Appointment: Diabetes follow-up   History of Present Illness   Diagnosis: Type 2 DIABETES MELITUS, date of diagnosis: 1980      Previous history: In the past he has been on various regimens of oral hypoglycemic drugs including Amaryl, Januvia and Glucophage. Had stopped Januvia because of cost. His blood sugar control has usually been good with A1c usually below 6.5, detailed records are not available He previously was weighing about 300 pounds and has lost weight over the last couple of years  Recent history: He was previously on glipizide which was stopped because of low normal blood sugars Also his metformin was increased to 500 mg 2 times a day when his blood sugars are relatively higher. With this his blood sugars are reasonably well-controlled considering his age and duration of diabetes He will have higher readings after meals which he thinks is from eating sweets like oatmeal cookies , has only 2 readings in the evening recently on his download Currently not active, has no motivation     Oral hypoglycemic drugs: metformin 500 mg twice a day         Side effects from medications: None                Monitors blood glucose: Once a day.    Glucometer:  Accu-Chek           Blood Glucose readings from meter download:   Fasting readings 109-125, evening 188, 168. Has only 4 readings in the morning and 2 in the evening  Hypoglycemia: None          Meals: 3 meals per day.            Physical activity: exercise: Occasional walking, limited by knee joint pain           Dietician visit: Most recent: Several years ago    Weight control: His weight in 2012 was 297  Wt Readings from Last 3 Encounters:  01/30/14 278 lb 12.8 oz (126.463 kg)  12/11/13 276 lb 9.6 oz (125.465 kg)  09/09/13 263 lb 3.2 oz (119.387 kg)         Complications: Neuropathy, proteinuria  Diabetes labs:  From PCP:A1 C. was  upper normal at 5.8 in 12/14.   Lab Results  Component Value Date   HGBA1C 6.5 12/11/2013   No results found for this basename: GLUF,  MICROALBUR,  LDLCALC,  CREATININE     PROBLEM #2: Depression and Anxiety. He has had chronic anxiety which has been treated with BuSpar and subsequently lorazepam.  He continues to take lorazepam 1 mg 4 times a day  Because of depression he has been on Lexapro with relatively good control Has low level of motivation and also some negative thinking      Medication List       This list is accurate as of: 01/30/14  3:52 PM.  Always use your most recent med list.               amLODipine 10 MG tablet  Commonly known as:  NORVASC  Take 1 tablet (10 mg total) by mouth daily.     atorvastatin 20 MG tablet  Commonly known as:  LIPITOR  Take 1 tablet (20 mg total) by mouth daily.     bisoprolol-hydrochlorothiazide 5-6.25 MG per tablet  Commonly known as:  ZIAC  Take 1 tablet by mouth daily.  buPROPion 150 MG 24 hr tablet  Commonly known as:  WELLBUTRIN XL  Take 1 tablet (150 mg total) by mouth daily.     doxazosin 2 MG tablet  Commonly known as:  CARDURA  TAKE ONE TABLET BY MOUTH AT BEDTIME     escitalopram 10 MG tablet  Commonly known as:  LEXAPRO  TAKE ONE TABLET BY MOUTH ONCE DAILY     glucose blood test strip  Commonly known as:  ACCU-CHEK AVIVA  Use as instructed to check blood sugar 1 time per day dx code 250.00     lisinopril 40 MG tablet  Commonly known as:  PRINIVIL,ZESTRIL  Take 1 tablet (40 mg total) by mouth daily.     LORazepam 1 MG tablet  Commonly known as:  ATIVAN  TAKE 1 TABLET BY MOUTH 4 TIMES DAILY.     metFORMIN 500 MG tablet  Commonly known as:  GLUCOPHAGE  Take 1 tablet (500 mg total) by mouth 2 (two) times daily with a meal.     multivitamin with minerals Tabs tablet  Take 1 tablet by mouth daily.        Allergies: No Known Allergies  No past medical history on file.  No past surgical history  on file.  No family history on file.  Social History:  reports that he has never smoked. His smokeless tobacco use includes Chew. His alcohol and drug histories are not on file.  Review of Systems:  Hypertension: He had previously required multiple medications. On a previous visit  his blood pressure was high with regimen of lisinopril, amlodipine and Ziac he was given doxazosin 2 mg also He has not monitored his blood pressure at home although his sister can do this. No lightheadedness  He has gained some weight        Toxic multinodular goiter treated with I131 in 1995.  Goiter has persisted but smaller.  TSH persistently mildly low with previously normal free T4 levels.  No difficulty swallowing or choking      Has had knee joint pains, still asking about the pain. Previously had taken hydrocodone for this       Lipids: This is being treated with Lipitor     LABS:  No visits with results within 1 Week(s) from this visit. Latest known visit with results is:  Office Visit on 12/11/2013  Component Date Value Ref Range Status  . Sodium 12/11/2013 140  135 - 145 mEq/L Final  . Potassium 12/11/2013 3.9  3.5 - 5.1 mEq/L Final  . Chloride 12/11/2013 105  96 - 112 mEq/L Final  . CO2 12/11/2013 29  19 - 32 mEq/L Final  . Glucose, Bld 12/11/2013 127* 70 - 99 mg/dL Final  . BUN 12/11/2013 20  6 - 23 mg/dL Final  . Creatinine, Ser 12/11/2013 1.2  0.4 - 1.5 mg/dL Final  . Total Bilirubin 12/11/2013 0.5  0.2 - 1.2 mg/dL Final  . Alkaline Phosphatase 12/11/2013 49  39 - 117 U/L Final  . AST 12/11/2013 23  0 - 37 U/L Final  . ALT 12/11/2013 14  0 - 53 U/L Final  . Total Protein 12/11/2013 5.8* 6.0 - 8.3 g/dL Final  . Albumin 12/11/2013 3.1* 3.5 - 5.2 g/dL Final  . Calcium 12/11/2013 8.4  8.4 - 10.5 mg/dL Final  . GFR 12/11/2013 64.24  >60.00 mL/min Final  . Cholesterol 12/11/2013 168  0 - 200 mg/dL Final   ATP III Classification       Desirable:  <  200 mg/dL               Borderline  High:  200 - 239 mg/dL          High:  > = 240 mg/dL  . Triglycerides 12/11/2013 155.0* 0.0 - 149.0 mg/dL Final   Normal:  <150 mg/dLBorderline High:  150 - 199 mg/dL  . HDL 12/11/2013 34.60* >39.00 mg/dL Final  . VLDL 12/11/2013 31.0  0.0 - 40.0 mg/dL Final  . LDL Cholesterol 12/11/2013 102* 0 - 99 mg/dL Final  . Total CHOL/HDL Ratio 12/11/2013 5   Final                  Men          Women1/2 Average Risk     3.4          3.3Average Risk          5.0          4.42X Average Risk          9.6          7.13X Average Risk          15.0          11.0                      . NonHDL 12/11/2013 133.40   Final  . Free T4 12/11/2013 0.98  0.60 - 1.60 ng/dL Final  . T3, Free 12/11/2013 2.4  2.3 - 4.2 pg/mL Final  . TSH 12/11/2013 0.05* 0.35 - 4.50 uIU/mL Final  . Hemoglobin A1C 12/11/2013 6.5  4.6 - 6.5 % Final   Glycemic Control Guidelines for People with Diabetes:Non Diabetic:  <6%Goal of Therapy: <7%Additional Action Suggested:  >8%      Examination:  Physical Exam   BP 162/78  Pulse 69  Temp(Src) 98.5 F (36.9 C)  Resp 16  Ht 5\' 11"  (1.803 m)  Wt 278 lb 12.8 oz (126.463 kg)  BMI 38.90 kg/m2  SpO2 95%  Body mass index is 38.9 kg/(m^2).   Repeat blood pressure 160/70 No ankle edema present  ASSESSMENT/ PLAN:   1.  Diabetes type 2 with fairly good control on metformin alone.  He is having a few high readings in the evenings only which is related to inconsistent diet Will check his A1c in 2 months Encouraged him to start being more active He will continue on metformin alone as well as continue to check his blood glucose periodically including after meals and call if unusually high  2.  Endogenous depression. Better controlled with continuing Lexapro 10 mg daily and adding Wellbutrin He appears to be still somewhat irritable and negative today  3.  Hypertension requiring multiple medications. Overall blood pressure was better with adding doxazosin  Blood pressure is higher today  after her dosing his amlodipine the last visit However he may be anxious and will need to check his blood pressure at home His sister will call regarding his blood pressure readings if they are still high  4.   Hypercholesterolemia: We'll need to check his lipid levels again after increasing his Lipitor to 20 mg    Fitz Matsuo 01/30/2014, 3:52 PM   Addendum: Creatinine 1.8, will reduce his lisinopril to 10 mg and increase doxazosin to 4 mg Also to avoid any ibuprofen or Aleve. Needs to followup in 3 weeks for reassessment

## 2014-02-01 NOTE — Progress Notes (Signed)
Quick Note:  Kidney test is significantly worse, will reduce his lisinopril to 10 mg with new prescription and increase doxazosin to 4 mg Also to avoid any ibuprofen or Aleve. Needs to followup office visit with CMP in 3 weeks for reassessment ______

## 2014-02-05 ENCOUNTER — Encounter: Payer: Self-pay | Admitting: *Deleted

## 2014-02-05 ENCOUNTER — Other Ambulatory Visit: Payer: Self-pay | Admitting: *Deleted

## 2014-02-05 MED ORDER — DOXAZOSIN MESYLATE 4 MG PO TABS: 4.0000 mg | ORAL_TABLET | Freq: Every day | ORAL | Status: DC

## 2014-02-05 MED ORDER — LISINOPRIL 10 MG PO TABS: 10.0000 mg | ORAL_TABLET | Freq: Every day | ORAL | Status: DC

## 2014-03-09 ENCOUNTER — Ambulatory Visit: Payer: 59 | Admitting: Endocrinology

## 2014-04-14 ENCOUNTER — Other Ambulatory Visit: Payer: Self-pay | Admitting: Endocrinology

## 2014-04-24 ENCOUNTER — Other Ambulatory Visit: Payer: Self-pay | Admitting: Endocrinology

## 2014-05-04 ENCOUNTER — Ambulatory Visit: Payer: 59 | Admitting: Endocrinology

## 2014-08-06 ENCOUNTER — Other Ambulatory Visit: Payer: Self-pay | Admitting: Endocrinology

## 2014-08-13 ENCOUNTER — Other Ambulatory Visit: Payer: Self-pay | Admitting: Endocrinology

## 2014-08-20 ENCOUNTER — Other Ambulatory Visit: Payer: Self-pay | Admitting: Endocrinology

## 2018-06-25 DIAGNOSIS — R600 Localized edema: Secondary | ICD-10-CM

## 2018-06-25 DIAGNOSIS — I361 Nonrheumatic tricuspid (valve) insufficiency: Secondary | ICD-10-CM | POA: Diagnosis not present

## 2018-06-25 DIAGNOSIS — N179 Acute kidney failure, unspecified: Secondary | ICD-10-CM

## 2018-06-25 DIAGNOSIS — I160 Hypertensive urgency: Secondary | ICD-10-CM

## 2018-06-25 DIAGNOSIS — R7989 Other specified abnormal findings of blood chemistry: Secondary | ICD-10-CM

## 2018-06-25 DIAGNOSIS — E1169 Type 2 diabetes mellitus with other specified complication: Secondary | ICD-10-CM

## 2018-06-25 DIAGNOSIS — I350 Nonrheumatic aortic (valve) stenosis: Secondary | ICD-10-CM

## 2018-06-25 DIAGNOSIS — I340 Nonrheumatic mitral (valve) insufficiency: Secondary | ICD-10-CM

## 2018-06-26 DIAGNOSIS — N179 Acute kidney failure, unspecified: Secondary | ICD-10-CM | POA: Diagnosis not present

## 2018-06-26 DIAGNOSIS — E1169 Type 2 diabetes mellitus with other specified complication: Secondary | ICD-10-CM | POA: Diagnosis not present

## 2018-06-26 DIAGNOSIS — I110 Hypertensive heart disease with heart failure: Secondary | ICD-10-CM

## 2018-06-26 DIAGNOSIS — R600 Localized edema: Secondary | ICD-10-CM | POA: Diagnosis not present

## 2018-06-27 DIAGNOSIS — R600 Localized edema: Secondary | ICD-10-CM

## 2018-06-27 DIAGNOSIS — I110 Hypertensive heart disease with heart failure: Secondary | ICD-10-CM

## 2018-06-27 DIAGNOSIS — N179 Acute kidney failure, unspecified: Secondary | ICD-10-CM

## 2018-06-27 DIAGNOSIS — E1169 Type 2 diabetes mellitus with other specified complication: Secondary | ICD-10-CM

## 2018-06-27 DIAGNOSIS — I160 Hypertensive urgency: Secondary | ICD-10-CM

## 2018-09-09 DIAGNOSIS — I272 Pulmonary hypertension, unspecified: Secondary | ICD-10-CM

## 2018-09-09 DIAGNOSIS — R0602 Shortness of breath: Secondary | ICD-10-CM

## 2018-09-09 DIAGNOSIS — I5031 Acute diastolic (congestive) heart failure: Secondary | ICD-10-CM

## 2018-09-09 DIAGNOSIS — N179 Acute kidney failure, unspecified: Secondary | ICD-10-CM

## 2018-09-09 DIAGNOSIS — N184 Chronic kidney disease, stage 4 (severe): Secondary | ICD-10-CM

## 2018-09-09 DIAGNOSIS — I130 Hypertensive heart and chronic kidney disease with heart failure and stage 1 through stage 4 chronic kidney disease, or unspecified chronic kidney disease: Secondary | ICD-10-CM

## 2018-09-09 DIAGNOSIS — I509 Heart failure, unspecified: Secondary | ICD-10-CM | POA: Diagnosis not present

## 2018-09-09 DIAGNOSIS — I350 Nonrheumatic aortic (valve) stenosis: Secondary | ICD-10-CM

## 2018-09-09 DIAGNOSIS — E1122 Type 2 diabetes mellitus with diabetic chronic kidney disease: Secondary | ICD-10-CM

## 2018-09-10 DIAGNOSIS — N184 Chronic kidney disease, stage 4 (severe): Secondary | ICD-10-CM | POA: Diagnosis not present

## 2018-09-10 DIAGNOSIS — I5031 Acute diastolic (congestive) heart failure: Secondary | ICD-10-CM | POA: Diagnosis not present

## 2018-09-10 DIAGNOSIS — R0602 Shortness of breath: Secondary | ICD-10-CM | POA: Diagnosis not present

## 2018-09-10 DIAGNOSIS — I130 Hypertensive heart and chronic kidney disease with heart failure and stage 1 through stage 4 chronic kidney disease, or unspecified chronic kidney disease: Secondary | ICD-10-CM | POA: Diagnosis not present

## 2018-09-11 ENCOUNTER — Encounter (HOSPITAL_COMMUNITY): Payer: Self-pay | Admitting: General Practice

## 2018-09-11 ENCOUNTER — Other Ambulatory Visit: Payer: Self-pay

## 2018-09-11 ENCOUNTER — Inpatient Hospital Stay (HOSPITAL_COMMUNITY)
Admission: AD | Admit: 2018-09-11 | Discharge: 2018-09-17 | DRG: 306 | Disposition: A | Discharge status: Home Health | Payer: Medicare HMO | Source: Other Acute Inpatient Hospital | Attending: Cardiovascular Disease | Admitting: Cardiovascular Disease

## 2018-09-11 DIAGNOSIS — Z720 Tobacco use: Secondary | ICD-10-CM

## 2018-09-11 DIAGNOSIS — E876 Hypokalemia: Secondary | ICD-10-CM | POA: Diagnosis present

## 2018-09-11 DIAGNOSIS — F0390 Unspecified dementia without behavioral disturbance: Secondary | ICD-10-CM | POA: Diagnosis present

## 2018-09-11 DIAGNOSIS — Z515 Encounter for palliative care: Secondary | ICD-10-CM | POA: Diagnosis not present

## 2018-09-11 DIAGNOSIS — Z7189 Other specified counseling: Secondary | ICD-10-CM | POA: Diagnosis not present

## 2018-09-11 DIAGNOSIS — E782 Mixed hyperlipidemia: Secondary | ICD-10-CM | POA: Diagnosis present

## 2018-09-11 DIAGNOSIS — I350 Nonrheumatic aortic (valve) stenosis: Secondary | ICD-10-CM | POA: Diagnosis present

## 2018-09-11 DIAGNOSIS — N184 Chronic kidney disease, stage 4 (severe): Secondary | ICD-10-CM | POA: Diagnosis present

## 2018-09-11 DIAGNOSIS — R0902 Hypoxemia: Secondary | ICD-10-CM | POA: Diagnosis present

## 2018-09-11 DIAGNOSIS — I130 Hypertensive heart and chronic kidney disease with heart failure and stage 1 through stage 4 chronic kidney disease, or unspecified chronic kidney disease: Secondary | ICD-10-CM | POA: Diagnosis present

## 2018-09-11 DIAGNOSIS — B372 Candidiasis of skin and nail: Secondary | ICD-10-CM | POA: Diagnosis present

## 2018-09-11 DIAGNOSIS — N179 Acute kidney failure, unspecified: Secondary | ICD-10-CM | POA: Diagnosis present

## 2018-09-11 DIAGNOSIS — Z66 Do not resuscitate: Secondary | ICD-10-CM | POA: Diagnosis present

## 2018-09-11 DIAGNOSIS — D631 Anemia in chronic kidney disease: Secondary | ICD-10-CM | POA: Diagnosis present

## 2018-09-11 DIAGNOSIS — E1122 Type 2 diabetes mellitus with diabetic chronic kidney disease: Secondary | ICD-10-CM | POA: Diagnosis present

## 2018-09-11 DIAGNOSIS — Z79899 Other long term (current) drug therapy: Secondary | ICD-10-CM

## 2018-09-11 DIAGNOSIS — I5033 Acute on chronic diastolic (congestive) heart failure: Secondary | ICD-10-CM | POA: Diagnosis present

## 2018-09-11 DIAGNOSIS — E785 Hyperlipidemia, unspecified: Secondary | ICD-10-CM

## 2018-09-11 DIAGNOSIS — I5031 Acute diastolic (congestive) heart failure: Secondary | ICD-10-CM | POA: Diagnosis not present

## 2018-09-11 DIAGNOSIS — R0602 Shortness of breath: Secondary | ICD-10-CM | POA: Diagnosis not present

## 2018-09-11 DIAGNOSIS — I10 Essential (primary) hypertension: Secondary | ICD-10-CM

## 2018-09-11 DIAGNOSIS — E119 Type 2 diabetes mellitus without complications: Secondary | ICD-10-CM

## 2018-09-11 HISTORY — DX: Morbid (severe) obesity due to excess calories: E66.01

## 2018-09-11 HISTORY — DX: Mixed hyperlipidemia: E78.2

## 2018-09-11 HISTORY — DX: Essential (primary) hypertension: I10

## 2018-09-11 HISTORY — DX: Chronic kidney disease, stage 4 (severe): N18.4

## 2018-09-11 HISTORY — DX: Type 2 diabetes mellitus without complications: E11.9

## 2018-09-11 HISTORY — DX: Nonrheumatic aortic (valve) stenosis: I35.0

## 2018-09-11 LAB — CREATININE, SERUM
Creatinine, Ser: 2.95 mg/dL — ABNORMAL HIGH (ref 0.61–1.24)
GFR calc Af Amer: 22 mL/min — ABNORMAL LOW (ref >60)
GFR calc non Af Amer: 19 mL/min — ABNORMAL LOW (ref >60)

## 2018-09-11 LAB — CBC
HEMATOCRIT: 31.4 % — AB (ref 39.0–52.0)
Hemoglobin: 9 g/dL — ABNORMAL LOW (ref 13.0–17.0)
MCH: 25.4 pg — ABNORMAL LOW (ref 26.0–34.0)
MCHC: 28.7 g/dL — ABNORMAL LOW (ref 30.0–36.0)
MCV: 88.7 fL (ref 80.0–100.0)
Platelets: 187 10*3/uL (ref 150–400)
RBC: 3.54 MIL/uL — ABNORMAL LOW (ref 4.22–5.81)
RDW: 17.8 % — ABNORMAL HIGH (ref 11.5–15.5)
WBC: 9.5 10*3/uL (ref 4.0–10.5)
nRBC: 0 % (ref 0.0–0.2)

## 2018-09-11 LAB — GLUCOSE, CAPILLARY
Glucose-Capillary: 138 mg/dL — ABNORMAL HIGH (ref 70–99)
Glucose-Capillary: 148 mg/dL — ABNORMAL HIGH (ref 70–99)

## 2018-09-11 MED ORDER — HEPARIN SODIUM (PORCINE) 5000 UNIT/ML IJ SOLN
5000.0000 [IU] | Freq: Three times a day (TID) | INTRAMUSCULAR | Status: DC
  Administered 2018-09-11 – 2018-09-17 (×16): 5000 [IU] via SUBCUTANEOUS
  Filled 2018-09-11 (×17): qty 1

## 2018-09-11 MED ORDER — SODIUM CHLORIDE 0.9% FLUSH: 3.0000 mL | INTRAVENOUS | Status: DC | PRN

## 2018-09-11 MED ORDER — INSULIN ASPART 100 UNIT/ML ~~LOC~~ SOLN
0.0000 [IU] | Freq: Three times a day (TID) | SUBCUTANEOUS | Status: DC
  Administered 2018-09-12 – 2018-09-13 (×2): 3 [IU] via SUBCUTANEOUS
  Administered 2018-09-14 – 2018-09-17 (×2): 2 [IU] via SUBCUTANEOUS

## 2018-09-11 MED ORDER — SODIUM CHLORIDE 0.9% FLUSH
3.0000 mL | Freq: Two times a day (BID) | INTRAVENOUS | Status: DC
  Administered 2018-09-11 – 2018-09-17 (×12): 3 mL via INTRAVENOUS

## 2018-09-11 MED ORDER — NYSTATIN 100000 UNIT/GM EX CREA
TOPICAL_CREAM | Freq: Two times a day (BID) | CUTANEOUS | Status: DC
  Administered 2018-09-11 – 2018-09-13 (×5): via TOPICAL
  Administered 2018-09-14: 1 via TOPICAL
  Administered 2018-09-14 – 2018-09-17 (×6): via TOPICAL
  Filled 2018-09-11: qty 15

## 2018-09-11 MED ORDER — ONDANSETRON HCL 4 MG/2ML IJ SOLN: 4.0000 mg | Freq: Four times a day (QID) | INTRAMUSCULAR | Status: DC | PRN

## 2018-09-11 MED ORDER — ESCITALOPRAM OXALATE 10 MG PO TABS
10.0000 mg | ORAL_TABLET | Freq: Every day | ORAL | Status: DC
  Administered 2018-09-12 – 2018-09-17 (×6): 10 mg via ORAL
  Filled 2018-09-11 (×6): qty 1

## 2018-09-11 MED ORDER — SODIUM CHLORIDE 0.9 % IV SOLN
INTRAVENOUS | Status: DC
  Administered 2018-09-12: 07:00:00 via INTRAVENOUS

## 2018-09-11 MED ORDER — LORAZEPAM 1 MG PO TABS: 1.0000 mg | ORAL_TABLET | Freq: Four times a day (QID) | ORAL | Status: DC | PRN

## 2018-09-11 MED ORDER — SODIUM CHLORIDE 0.9% FLUSH
3.0000 mL | Freq: Two times a day (BID) | INTRAVENOUS | Status: DC
  Administered 2018-09-11: 3 mL via INTRAVENOUS

## 2018-09-11 MED ORDER — ACETAMINOPHEN 325 MG PO TABS
650.0000 mg | ORAL_TABLET | ORAL | Status: DC | PRN
  Filled 2018-09-11: qty 2

## 2018-09-11 MED ORDER — BISOPROLOL FUMARATE 5 MG PO TABS
5.0000 mg | ORAL_TABLET | Freq: Every day | ORAL | Status: DC
  Administered 2018-09-12 – 2018-09-17 (×6): 5 mg via ORAL
  Filled 2018-09-11 (×6): qty 1

## 2018-09-11 MED ORDER — AMLODIPINE BESYLATE 10 MG PO TABS
10.0000 mg | ORAL_TABLET | Freq: Every day | ORAL | Status: DC
  Administered 2018-09-12 – 2018-09-14 (×3): 10 mg via ORAL
  Filled 2018-09-11 (×4): qty 1

## 2018-09-11 MED ORDER — ASPIRIN EC 81 MG PO TBEC
81.0000 mg | DELAYED_RELEASE_TABLET | Freq: Every day | ORAL | Status: DC
  Administered 2018-09-11 – 2018-09-17 (×7): 81 mg via ORAL
  Filled 2018-09-11 (×8): qty 1

## 2018-09-11 MED ORDER — ATORVASTATIN CALCIUM 10 MG PO TABS
20.0000 mg | ORAL_TABLET | Freq: Every day | ORAL | Status: DC
  Administered 2018-09-12 – 2018-09-17 (×6): 20 mg via ORAL
  Filled 2018-09-11 (×7): qty 2

## 2018-09-11 MED ORDER — SODIUM CHLORIDE 0.9 % IV SOLN: 250.0000 mL | INTRAVENOUS | Status: DC | PRN

## 2018-09-11 MED ORDER — BUPROPION HCL ER (XL) 150 MG PO TB24
150.0000 mg | ORAL_TABLET | Freq: Every day | ORAL | Status: DC
  Administered 2018-09-12 – 2018-09-17 (×6): 150 mg via ORAL
  Filled 2018-09-11 (×6): qty 1

## 2018-09-11 MED ORDER — DOXAZOSIN MESYLATE 8 MG PO TABS
4.0000 mg | ORAL_TABLET | Freq: Every day | ORAL | Status: DC
  Administered 2018-09-12 – 2018-09-17 (×6): 4 mg via ORAL
  Filled 2018-09-11 (×6): qty 1

## 2018-09-11 NOTE — H&P (Addendum)
History & Physical    Patient ID: Billy [name] MRN: [id number], DOB/AGE: 01/23/1935   Admit date: 09/11/2018   Primary Physician: Billy [name]: Pt will f/u in [facility]  Patient Profile    84 y/o ? with a h/o HTN, DM, obesity, CKD IV, and HL, who presents on tx from [facility] following admission for volume overload, acute on chronic renal failure, and new finding of severe Ao stenosis.  Past Medical History    Past Medical History:  Diagnosis Date   CKD (chronic kidney disease), stage IV (HCC)    Diabetes mellitus without complication ([place])    Hypertension    Mixed hyperlipidemia    Morbid obesity ([place])    Severe aortic stenosis    a. 09/09/2018 Echo [name]): EF 55-60%, mod conc LVH. Nl RV fxn. Sev dil RA w/ elevated LA pressure. Mod RAE. Sev AS w/ peak grad of 59mmHg, mean of 61mmHg. AoV area 0.7cm. Mod TR. RVSP 86mmHg.    Past Surgical History:  Procedure Laterality Date   NO PAST SURGERIES       Allergies  No Known Allergies  History of Present Illness    84 y/o ? with the above complex PMH including HTN, HL, DMII, CKD IV, obesity, and murmur.  He says that he was evaluated @ the [facility] many years ago related to his murmur, but never required additional cardiology f/u.  He lives by himself in [place] with family nearby.  He does not routinely exercise but still drives.  He has no prior h/o chest pain and does not think he's ever had a stress test.  He was in his usoh until approx 3 wks ago, when he began to note progressive DOE w/o associated chest pain, edema, change in abd girth, orthopnea, or palpitations.  He had labs on 3/9 and was found to have a creat of 3.11 (baseline reportedly 2.5-2.6).  F/u labs on 3/11 returned w/ a creatinine of 3.42.  He was apparently seen by his PCP on 3/13 and his lasix dose was reduced.  He was seen back in clinic on March 15 and was noted to be more dyspneic with increasing lower  extremity edema.  In the setting of complex cardiorenal syndrome, he was referred to cardiology.  Nephrology consult was also placed.  Unfortunately, symptoms worsened and his daughter decided to bring him to the emergency department at [facility] on March 16.  There, he was noted to be volume overloaded and hypoxic with a saturation of 87%.  He was hypertensive and creatinine remained elevated at 3.2.  proBNP was elevated at 17,000.  Chest x-ray showed low lung volumes with hazy lung densities and prominent central vascular markings.  He was given IV Lasix in the ED and subsequently admitted.  Over the span of his admission, he appears to have had a net positive balance though weight is recorded on March 18 at 123.74 kg, down approximately 1-1/2 kg from admission.  Patient noted improvement in lower extremity swelling and dyspnea while using oxygen on nasal cannula.  An echocardiogram was performed March 16, which showed normal LV function with severe aortic stenosis as outlined above in the past medical history.  In that setting, recommendation was made for transfer to [facility] for further consideration for TAVR.  Patient currently is resting in bed with and in no distress.  He has no complaints at this time.  Home Medications    Prior to Admission medications  Medication Sig Start Date End Date Taking? Authorizing Provider  amLODipine (NORVASC) 10 MG tablet TAKE ONE TABLET BY MOUTH ONCE DAILY 08/20/14   Elayne Snare, MD  atorvastatin (LIPITOR) 20 MG tablet Take 1 tablet (20 mg total) by mouth daily. 01/12/14   Elayne Snare, MD  bisoprolol-hydrochlorothiazide [facility]) 5-6.25 MG per tablet Take 1 tablet by mouth daily. 01/08/14   Elayne Snare, MD  buPROPion (WELLBUTRIN XL) 150 MG 24 hr tablet Take 1 tablet (150 mg total) by mouth daily. 01/08/14   Elayne Snare, MD  doxazosin (CARDURA) 4 MG tablet TAKE ONE TABLET BY MOUTH ONCE DAILY 04/15/14   Elayne Snare, MD  escitalopram (LEXAPRO) 10 MG tablet TAKE ONE  TABLET BY MOUTH ONCE DAILY 01/08/14   Elayne Snare, MD  escitalopram (LEXAPRO) 10 MG tablet TAKE ONE TABLET BY MOUTH ONCE DAILY 04/27/14   Elayne Snare, MD  glucose blood (ACCU-CHEK AVIVA) test strip Use as instructed to check blood sugar 1 time per day dx code 250.00 01/08/14   Elayne Snare, MD  lisinopril (PRINIVIL,ZESTRIL) 10 MG tablet Take 1 tablet (10 mg total) by mouth daily. 02/05/14   Elayne Snare, MD  LORazepam (ATIVAN) 1 MG tablet TAKE ONE TABLET BY MOUTH 4 TIMES DAILY 04/27/14   Elayne Snare, MD  metFORMIN (GLUCOPHAGE) 500 MG tablet TAKE ONE TABLET BY MOUTH TWICE DAILY WITH MEALS 04/27/14   Elayne Snare, MD  Multiple Vitamin (MULTIVITAMIN WITH MINERALS) TABS tablet Take 1 tablet by mouth daily.    [provider]    Family History    No premature coronary artery disease. History reviewed. No pertinent family history. He indicated that his mother is deceased. He indicated that his father is deceased.  Social History    Social History   Socioeconomic History   Marital status: Divorced    Spouse name: Not on file   Number of children: Not on file   Years of education: Not on file   Highest education level: Not on file  Occupational History   Occupation: Retired    Comment: Retired from [employer] and then from [place] resource strain: Not on file   Food insecurity:    Worry: Not on file    Inability: Not on file   Transportation needs:    Medical: Not on file    Non-medical: Not on file  Tobacco Use   Smoking status: Former Smoker   Smokeless tobacco: Current User    Types: Chew   Tobacco comment: Light smoker ~ 50 yrs ago.  Substance and Sexual Activity   Alcohol use: Not Currently   Drug use: Never   Sexual activity: Not on file  Lifestyle   Physical activity:    Days per week: Not on file    Minutes per session: Not on file   Stress: Not on file  Relationships   Social connections:    Talks on phone: Not on file     Gets together: Not on file    Attends religious service: Not on file    Active member of club or organization: Not on file    Attends meetings of clubs or organizations: Not on file    Relationship status: Not on file   Intimate partner violence:    Fear of current or ex partner: Not on file    Emotionally abused: Not on file    Physically abused: Not on file    Forced sexual activity: Not on file  Other Topics  Concern   Not on file  Social History Narrative   Lives in [place] by himself.  Dtrs and sister nearby.  He does not routinely exercise.     Review of Systems    General:  No chills, fever, night sweats or weight changes.  Cardiovascular:  No chest pain, +++ dyspnea on exertion, +++ edema, no orthopnea, palpitations, paroxysmal nocturnal dyspnea. Dermatological: No rash, lesions/masses Respiratory: No cough, +++ dyspnea Urologic: No hematuria, dysuria Abdominal:   No nausea, vomiting, diarrhea, bright red blood per rectum, melena, or hematemesis Neurologic:  No visual changes, wkns, changes in mental status. All other systems reviewed and are otherwise negative except as noted above.  Physical Exam    Blood pressure (!) 133/53, pulse 64, temperature 97.8 F (36.6 C), temperature source Oral, resp. rate 18, height 6' (1.829 m), weight 124 kg, SpO2 95 %.  General: Pleasant, NAD Psych: Normal affect. Neuro: Alert and oriented X 3. Moves all extremities spontaneously. HEENT: Normal  Neck: Supple with soft radiated aortic murmur bilaterally.  Obese and difficult to gauge JVP secondary to body habitus. Lungs:  Resp regular and unlabored, crackles two thirds of the way up in the left but clear on the right. Heart: RRR no s3, s4, 3/6 systolic ejection murmur heard throughout and radiating to the bilateral neck.  Audible S2. Abdomen: Obese, soft, non-tender, non-distended, BS + x 4.  Extremities: No clubbing, cyanosis or trace bilateral pretibial edema. DP/PT/Radials 1+ and  equal bilaterally. Groin is erythematous and moist bilat.  Labs    From [place]-September 11, 2018 Hemoglobin 8.6, hematocrit 28.0, WBC 9.9, platelets 198 Sodium 140, potassium 4.2, chloride 106, CO2 23, BUN 82, creatinine 2.7, glucose 87 From March 16: Troponin I 0.033.  proBNP 17,200.   Radiology Studies    No results found.  ECG & Cardiac Imaging    Regular sinus rhythm, 61, first-degree AV block, left axis deviation, left anterior fascicular block, IVCD- personally reviewed.  Assessment & Plan    1.  Acute heart failure in the setting of severe aortic stenosis: Patient presented to [facility] with a 3-week history of progressive dyspnea and subsequent finding of lower extremity edema by primary care.  Unfortunately, in the setting of stage IV chronic kidney disease, symptoms were recalcitrant to oral Lasix therapy prompting admission.  Based on notes, it appears that he received 1 dose of IV Lasix in the emergency department with good response.  Outside echo showed normal LV function with severe aortic stenosis and as result, he was transferred to [facility] for further evaluation.  Follow-up echo here.  Volume status reasonable though body habitus makes evaluation somewhat difficult.  In the setting of aortic stenosis, will hold his Lasix and ACE inhibitor therapy and follow-up lab work in the a.m.  Plan on diagnostic catheterization pending renal function. The patient understands that risks include but are not limited to stroke (1 in 1000), death (1 in 33), kidney failure [usually temporary] (1 in 500), bleeding (1 in 200), allergic reaction [possibly serious] (1 in 200), and agrees to proceed. Ultimately will require additional imaging related to TAVR work-up.  We will also need to discuss more completely with family once they are available.  2.  Essential hypertension: Stable.  Continue home meds but holding ACE inhibitor and diuretic.  3.  Hyperlipidemia: Continue statin  therapy.  4.  Type 2 diabetes mellitus: We will add sliding scale insulin.  He was on metformin at home and I will hold this.  5.  Stage IV chronic kidney disease: Holding ACE inhibitor and diuretic as above.  May require nephrology consultation.  He does not currently have an outpatient nephrologist but was referred recently.  6.  Anemia: H&H this morning 8.6 and 28.  Follow-up in a.m.  Likely secondary to chronic disease.  7.  Candida infection: Groin is red and moist.  We will add nystatin.  Signed, Murray Hodgkins, NP 09/11/2018, 6:32 PM  Patient seen, examined. Available data reviewed. Agree with findings, assessment, and plan as outlined by Ignacia Bayley, NP. The patient is an alert, oriented, elderly male in NAD. JVP mildly elevated, carotids 2+ with bilateral bruits, lungs diminished left base, CV: RRR with 3/6 harsh late peaking systolic murmur with no diastolic murmur. A2 present. Abdomen: soft, obese, NT. Extremities with trace bilateral pretibial edema. Skin with petechial rash noted left leg and foot.  Echo demonstrates normal LV systolic function with LVEF 65%, severe AS with peak and mean transvalvular gradients of 71 and 43 mmHg. The patient has NYHA functional class 3B acute on chronic diastolic heart failure with underlying severe aortic stenosis. His symptoms have progressed over 2 months and include exertional dyspnea with minimal activity and lightheadedness. I have reviewed the natural history of aortic stenosis with the patient today. We have discussed the limitations of medical therapy and the poor prognosis associated with symptomatic aortic stenosis. We have reviewed potential treatment options, including palliative medical therapy, conventional surgical aortic valve replacement, and transcatheter aortic valve replacement. We discussed treatment options in the context of the patient's specific comorbid medical conditions. His STS Risk is below:  RISK SCORES Procedure:  Isolated AVR CALCULATE  Risk of Mortality:  3.049%   Renal Failure:  19.665%   Permanent Stroke:  1.583%   Prolonged Ventilation:  15.941%   DSW Infection:  0.280%   Reoperation:  4.983%   Morbidity or Mortality:  36.855%   Short Length of Stay:  14.637%   Long Length of Stay:  16.628%   He will require further evaluation with R/L heart catheterization as next step. Once his cath is completed, he will likely require staged CTA studies to evaluate anatomic feasibility of transfemoral TAVR. These studies with have to be separated from his heart catheterization in order to reduce the risk of contrast nephropathy. Will discuss all of this further with his family when they are available (the patient is alone tonight at the time of my evaluation). Considering his advanced age, mild dementia, heart failure, Stage IV CKD, and diabetes, TAVR will likely be a preferred treatment strategy for this patient's aortic stenosis.    Sherren Mocha, M.D. 09/11/2018 8:52 PM

## 2018-09-12 ENCOUNTER — Inpatient Hospital Stay (HOSPITAL_COMMUNITY): Payer: Medicare HMO

## 2018-09-12 DIAGNOSIS — I350 Nonrheumatic aortic (valve) stenosis: Secondary | ICD-10-CM

## 2018-09-12 DIAGNOSIS — I5033 Acute on chronic diastolic (congestive) heart failure: Secondary | ICD-10-CM

## 2018-09-12 LAB — GLUCOSE, CAPILLARY
GLUCOSE-CAPILLARY: 109 mg/dL — AB (ref 70–99)
Glucose-Capillary: 102 mg/dL — ABNORMAL HIGH (ref 70–99)
Glucose-Capillary: 171 mg/dL — ABNORMAL HIGH (ref 70–99)
Glucose-Capillary: 102 mg/dL — ABNORMAL HIGH (ref 70–99)

## 2018-09-12 LAB — CBC WITH DIFFERENTIAL/PLATELET
Abs Immature Granulocytes: 0.04 10*3/uL (ref 0.00–0.07)
Basophils Absolute: 0 10*3/uL (ref 0.0–0.1)
Basophils Relative: 0 %
Eosinophils Absolute: 0.1 10*3/uL (ref 0.0–0.5)
Eosinophils Relative: 2 %
HCT: 26.5 % — ABNORMAL LOW (ref 39.0–52.0)
Hemoglobin: 8 g/dL — ABNORMAL LOW (ref 13.0–17.0)
Immature Granulocytes: 1 %
Lymphocytes Relative: 9 %
Lymphs Abs: 0.8 10*3/uL (ref 0.7–4.0)
MCH: 26.1 pg (ref 26.0–34.0)
MCHC: 30.2 g/dL (ref 30.0–36.0)
MCV: 86.6 fL (ref 80.0–100.0)
Monocytes Absolute: 0.8 10*3/uL (ref 0.1–1.0)
Monocytes Relative: 9 %
NRBC: 0 % (ref 0.0–0.2)
Neutro Abs: 7.1 10*3/uL (ref 1.7–7.7)
Neutrophils Relative %: 79 %
Platelets: 166 10*3/uL (ref 150–400)
RBC: 3.06 MIL/uL — ABNORMAL LOW (ref 4.22–5.81)
RDW: 17.9 % — AB (ref 11.5–15.5)
WBC: 8.8 10*3/uL (ref 4.0–10.5)

## 2018-09-12 LAB — CREATININE, URINE, RANDOM: CREATININE, URINE: 93.58 mg/dL

## 2018-09-12 LAB — PROTEIN / CREATININE RATIO, URINE
Creatinine, Urine: 93.34 mg/dL
Protein Creatinine Ratio: 0.11 mg/mg{Cre} (ref 0.00–0.15)
Total Protein, Urine: 10 mg/dL

## 2018-09-12 LAB — BASIC METABOLIC PANEL
Anion gap: 12 (ref 5–15)
BUN: 75 mg/dL — ABNORMAL HIGH (ref 8–23)
CO2: 23 mmol/L (ref 22–32)
Calcium: 8.6 mg/dL — ABNORMAL LOW (ref 8.9–10.3)
Chloride: 106 mmol/L (ref 98–111)
Creatinine, Ser: 2.91 mg/dL — ABNORMAL HIGH (ref 0.61–1.24)
GFR calc non Af Amer: 19 mL/min — ABNORMAL LOW (ref >60)
GFR, EST AFRICAN AMERICAN: 22 mL/min — AB (ref >60)
Glucose, Bld: 107 mg/dL — ABNORMAL HIGH (ref 70–99)
Potassium: 3.5 mmol/L (ref 3.5–5.1)
Sodium: 141 mmol/L (ref 135–145)

## 2018-09-12 LAB — IRON AND TIBC
Iron: 22 ug/dL — ABNORMAL LOW (ref 45–182)
Saturation Ratios: 7 % — ABNORMAL LOW (ref 17.9–39.5)
TIBC: 323 ug/dL (ref 250–450)
UIBC: 301 ug/dL

## 2018-09-12 LAB — URINALYSIS, COMPLETE (UACMP) WITH MICROSCOPIC
BACTERIA UA: NONE SEEN
Bilirubin Urine: NEGATIVE
Glucose, UA: NEGATIVE mg/dL
Hgb urine dipstick: NEGATIVE
Ketones, ur: NEGATIVE mg/dL
Nitrite: NEGATIVE
Protein, ur: NEGATIVE mg/dL
Specific Gravity, Urine: 1.011 (ref 1.005–1.030)
pH: 5 (ref 5.0–8.0)

## 2018-09-12 LAB — SODIUM, URINE, RANDOM: Sodium, Ur: 44 mmol/L

## 2018-09-12 LAB — VITAMIN B12: Vitamin B-12: 267 pg/mL (ref 180–914)

## 2018-09-12 LAB — ECHOCARDIOGRAM COMPLETE
Height: 72 in
Weight: 4352 oz

## 2018-09-12 LAB — FERRITIN: Ferritin: 65 ng/mL (ref 24–336)

## 2018-09-12 MED ORDER — FUROSEMIDE 10 MG/ML IJ SOLN
40.0000 mg | Freq: Once | INTRAMUSCULAR | Status: AC
  Administered 2018-09-12: 40 mg via INTRAVENOUS
  Filled 2018-09-12: qty 4

## 2018-09-12 NOTE — Consult Note (Signed)
Reason for Consult: AKI/CKD stage IV Referring Physician:  Burt Knack, MD  Billy [name] is an 84 y.o. male.  HPI: Billy [name] has a PMH significant for DM, HTN, obesity, hyperlipidemia, and CKD stage 4 (baseline creatinine reported to be 2.5-2.6) who presented to [facility] with worsening lower extremity edema and SOB.  Workup revealed severe aortic stenosis with EF of 55-60%, mod concentric LVH, sever dilated right atria and elevated left atrial pressures with aortic valve area of 0.7cm.  He was diuresed but developed worsening renal function and was transferred to [facility] for possible TAVR evaluation and management.  We were consulted due to worsening renal function in the setting of decompensated CHF, diuresis, and concomitant ACE inhibition.  His lisinopril was held upon arrival yesterday.  The trend in Scr is seen below.  His left heart cath and CT scan have been appropriately put on hold due to his AKI/CKD.   Trend in Creatinine: Creatinine, Ser  Date/Time Value Ref Range Status  09/12/2018 04:45 AM 2.91 (H) 0.61 - 1.24 mg/dL Final  09/11/2018 08:15 PM 2.95 (H) 0.61 - 1.24 mg/dL Final  09/09/2018 3.2 (H)    09/04/2018 3.42 (H)    09/02/2018 3.11 (H)    01/30/2014 04:09 PM 1.8 (H) 0.4 - 1.5 mg/dL Final  12/11/2013 09:22 AM 1.2 0.4 - 1.5 mg/dL Final  09/09/2013 10:40 AM 1.08 0.50 - 1.35 mg/dL Final    PMH:   Past Medical History:  Diagnosis Date  . CKD (chronic kidney disease), stage IV ([place])   . Diabetes mellitus without complication ([place])   . Hypertension   . Mixed hyperlipidemia   . Morbid obesity ([place])   . Severe aortic stenosis    a. 09/09/2018 Echo [facility]): EF 55-60%, mod conc LVH. Nl RV fxn. Sev dil RA w/ elevated LA pressure. Mod RAE. Sev AS w/ peak grad of 32mmHg, mean of 26mmHg. AoV area 0.7cm. Mod TR. RVSP 33mmHg.    PSH:   Past Surgical History:  Procedure Laterality Date  . NO PAST SURGERIES      Allergies: No Known Allergies  Medications:   Prior to  Admission medications   Medication Sig Start Date End Date Taking? Authorizing Provider  amLODipine (NORVASC) 10 MG tablet TAKE ONE TABLET BY MOUTH ONCE DAILY 08/20/14  Yes Elayne Snare, MD  aspirin EC 81 MG tablet Take 81 mg by mouth daily.   Yes [provider]  atorvastatin (LIPITOR) 20 MG tablet Take 1 tablet (20 mg total) by mouth daily. 01/12/14  Yes Elayne Snare, MD  bisoprolol (ZEBETA) 10 MG tablet Take 10 mg by mouth daily.   Yes [provider]  buPROPion (WELLBUTRIN SR) 150 MG 12 hr tablet Take 150 mg by mouth daily.   Yes [provider]  escitalopram (LEXAPRO) 10 MG tablet TAKE ONE TABLET BY MOUTH ONCE DAILY 01/08/14  Yes Elayne Snare, MD  Multiple Vitamin (MULTIVITAMIN) capsule Take 1 capsule by mouth daily.   Yes [provider]  bisoprolol-hydrochlorothiazide (ZIAC) 5-6.25 MG per tablet Take 1 tablet by mouth daily. Patient not taking: Reported on 09/12/2018 01/08/14   Elayne Snare, MD  buPROPion (WELLBUTRIN XL) 150 MG 24 hr tablet Take 1 tablet (150 mg total) by mouth daily. 01/08/14   Elayne Snare, MD  doxazosin (CARDURA) 4 MG tablet TAKE ONE TABLET BY MOUTH ONCE DAILY 04/15/14   Elayne Snare, MD  escitalopram (LEXAPRO) 10 MG tablet TAKE ONE TABLET BY MOUTH ONCE DAILY 04/27/14   Elayne Snare, MD  furosemide (  LASIX) 40 MG tablet Take 40 mg by mouth 2 (two) times daily. 07/18/18   [provider]  glucose blood (ACCU-CHEK AVIVA) test strip Use as instructed to check blood sugar 1 time per day dx code 250.00 01/08/14   Elayne Snare, MD  hydrALAZINE (APRESOLINE) 50 MG tablet Take 50 mg by mouth 2 (two) times daily. 08/30/18   [provider]  lisinopril (PRINIVIL,ZESTRIL) 10 MG tablet Take 1 tablet (10 mg total) by mouth daily. 02/05/14   Elayne Snare, MD  LORazepam (ATIVAN) 1 MG tablet TAKE ONE TABLET BY MOUTH 4 TIMES DAILY 04/27/14   Elayne Snare, MD  metFORMIN (GLUCOPHAGE) 500 MG tablet TAKE ONE TABLET BY MOUTH TWICE DAILY WITH MEALS Patient not  taking: Reported on 09/12/2018 04/27/14   Elayne Snare, MD  Multiple Vitamin (MULTIVITAMIN WITH MINERALS) TABS tablet Take 1 tablet by mouth daily.    [provider]  tamsulosin (FLOMAX) 0.4 MG CAPS capsule Take 0.4 mg by mouth at bedtime. 08/14/18   [provider]    Inpatient medications: . amLODipine  10 mg Oral Daily  . aspirin EC  81 mg Oral Daily  . atorvastatin  20 mg Oral Daily  . bisoprolol  5 mg Oral Daily  . buPROPion  150 mg Oral Daily  . doxazosin  4 mg Oral Daily  . escitalopram  10 mg Oral Daily  . heparin  5,000 Units Subcutaneous Q8H  . insulin aspart  0-15 Units Subcutaneous TID WC  . nystatin cream   Topical BID  . sodium chloride flush  3 mL Intravenous Q12H    Discontinued Meds:   Medications Discontinued During This Encounter  Medication Reason  . sodium chloride flush (NS) 0.9 % injection 3 mL   . sodium chloride flush (NS) 0.9 % injection 3 mL   . 0.9 %  sodium chloride infusion   . 0.9 %  sodium chloride infusion   . hydrochlorothiazide (HYDRODIURIL) 50 MG tablet Error    Social History:  reports that he has quit smoking. His smokeless tobacco use includes chew. He reports previous alcohol use. He reports that he does not use drugs.  Family History:  History reviewed. No pertinent family history.  Pertinent items are noted in HPI. Weight change:   Intake/Output Summary (Last 24 hours) at 09/12/2018 1423 Last data filed at 09/12/2018 1126 Gross per 24 hour  Intake 480 ml  Output 950 ml  Net -470 ml   BP (!) 110/47 (BP Location: Left Arm)   Pulse (!) 57   Temp 98 F (36.7 C) (Oral)   Resp 18   Ht 6' (1.829 m)   Wt 123.4 kg Comment: scale b  SpO2 95%   BMI 36.89 kg/m  Vitals:   09/12/18 0425 09/12/18 0500 09/12/18 0844 09/12/18 1203  BP: (!) 148/48  (!) 140/52 (!) 110/47  Pulse: 67  67 (!) 57  Resp: 17   18  Temp: 98.4 F (36.9 C)  98.1 F (36.7 C) 98 F (36.7 C)  TempSrc: Oral  Oral Oral  SpO2: 95%  94% 95%  Weight:   123.4 kg    Height:         General appearance: alert, cooperative, no distress and sitting upright in chair wearing oxygen Head: Normocephalic, without obvious abnormality, atraumatic Resp: rales bibasilar Cardio: regular rate and rhythm, systolic murmur: systolic ejection 3/6, harsh throughout the precordium, radiates to carotids and no rub GI: soft, non-tender; bowel sounds normal; no masses,  no organomegaly  Extremities: edema 1+ lower extremity edema  Labs: Basic Metabolic Panel: Recent Labs  Lab 09/11/18 2015 09/12/18 0445  NA  --  141  K  --  3.5  CL  --  106  CO2  --  23  GLUCOSE  --  107*  BUN  --  75*  CREATININE 2.95* 2.91*  CALCIUM  --  8.6*   Liver Function Tests: No results for input(s): AST, ALT, ALKPHOS, BILITOT, PROT, ALBUMIN in the last 168 hours. No results for input(s): LIPASE, AMYLASE in the last 168 hours. No results for input(s): AMMONIA in the last 168 hours. CBC: Recent Labs  Lab 09/11/18 2015 09/12/18 0445  WBC 9.5 8.8  NEUTROABS  --  7.1  HGB 9.0* 8.0*  HCT 31.4* 26.5*  MCV 88.7 86.6  PLT 187 166   PT/INR: @LABRCNTIP (inr:5) Cardiac Enzymes: )No results for input(s): CKTOTAL, CKMB, CKMBINDEX, TROPONINI in the last 168 hours. CBG: Recent Labs  Lab 09/11/18 1644 09/11/18 2151 09/12/18 0605 09/12/18 1125  GLUCAP 138* 148* 102* 171*    Iron Studies: No results for input(s): IRON, TIBC, TRANSFERRIN, FERRITIN in the last 168 hours.  Xrays/Other Studies: No results found.   Assessment/Plan: 1.  AKI/CKD stage 4- in setting of decompensated CHF due to severe aortic stenosis with diuresis and concomitant ACE inhibition.  Agree with holding ace and resuming lasix given his worsening dyspnea off of diuretics.  Difficult situation as he is a poor candidate for longterm dialysis given his multiple co-morbidities and poor functional status.  I am not sure the possible renal failure from contrast exposure would be temporary given his advanced  CKD at baseline.  No indication for dialysis at this time, however palliative care consult to help set goals/limits of care would be helpful before proceeding further. 2. Acute heart failure due to severe aortic stenosis- Cardiology evaluating patient and agree with IV lasix and follow his response.   3. HTN- stable off of lisinopril. 4. Hyperlipidemia- on statin therapy 5. DM type 2- hold metformin due to CKD stage 4 and follow 6. Anemia of CKD- will check iron stores and may require ESA therapy 7. SHPTH- will check vit D levels and iPTH   Broadus John A Makynzee Tigges 09/12/2018, 2:23 PM

## 2018-09-12 NOTE — Progress Notes (Signed)
  Echocardiogram 2D Echocardiogram has been performed.  Jannett Celestine 09/12/2018, 10:36 AM

## 2018-09-12 NOTE — Progress Notes (Addendum)
Progress Note  Patient Name: Billy [name] Date of Encounter: 09/12/2018  Primary Cardiologist: No primary care provider on file.   Subjective   Sitting up in bed, breathing is slightly labored while at rest.   Inpatient Medications    Scheduled Meds: . amLODipine  10 mg Oral Daily  . aspirin EC  81 mg Oral Daily  . atorvastatin  20 mg Oral Daily  . bisoprolol  5 mg Oral Daily  . buPROPion  150 mg Oral Daily  . doxazosin  4 mg Oral Daily  . escitalopram  10 mg Oral Daily  . heparin  5,000 Units Subcutaneous Q8H  . insulin aspart  0-15 Units Subcutaneous TID WC  . nystatin cream   Topical BID  . sodium chloride flush  3 mL Intravenous Q12H  . sodium chloride flush  3 mL Intravenous Q12H   Continuous Infusions: . sodium chloride    . sodium chloride    . sodium chloride 10 mL/hr at 09/12/18 0630   PRN Meds: sodium chloride, sodium chloride, acetaminophen, LORazepam, ondansetron (ZOFRAN) IV, sodium chloride flush, sodium chloride flush   Vital Signs    Vitals:   09/12/18 0006 09/12/18 0425 09/12/18 0500 09/12/18 0844  BP: (!) 143/51 (!) 148/48  (!) 140/52  Pulse: 68 67  67  Resp: 20 17    Temp: 98.6 F (37 C) 98.4 F (36.9 C)  98.1 F (36.7 C)  TempSrc: Oral Oral  Oral  SpO2: 94% 95%  94%  Weight:   123.4 kg   Height:        Intake/Output Summary (Last 24 hours) at 09/12/2018 0908 Last data filed at 09/12/2018 9702 Gross per 24 hour  Intake 240 ml  Output 950 ml  Net -710 ml   Last 3 Weights 09/12/2018 09/11/2018 01/30/2014  Weight (lbs) 272 lb 273 lb 6.4 oz 278 lb 12.8 oz  Weight (kg) 123.378 kg 124.013 kg 126.463 kg      Telemetry    SR - Personally Reviewed  ECG    N/a - Personally Reviewed  Physical Exam   GEN: Wearing [place], short of breath with conversation and simple movement. Neck: + JVD Cardiac: RRR, 3/6  Systolic murmurs, rubs, or gallops.  Respiratory: Diminished in bases, wheezing noted. GI: Soft, nontender, non-distended  MS: 1+ LE  edema; No deformity. Neuro:  Nonfocal  Psych: Normal affect   Labs    Chemistry Recent Labs  Lab 09/11/18 2015 09/12/18 0445  NA  --  141  K  --  3.5  CL  --  106  CO2  --  23  GLUCOSE  --  107*  BUN  --  75*  CREATININE 2.95* 2.91*  CALCIUM  --  8.6*  GFRNONAA 19* 19*  GFRAA 22* 22*  ANIONGAP  --  12     Hematology Recent Labs  Lab 09/11/18 2015 09/12/18 0445  WBC 9.5 8.8  RBC 3.54* 3.06*  HGB 9.0* 8.0*  HCT 31.4* 26.5*  MCV 88.7 86.6  MCH 25.4* 26.1  MCHC 28.7* 30.2  RDW 17.8* 17.9*  PLT 187 166    Cardiac EnzymesNo results for input(s): TROPONINI in the last 168 hours. No results for input(s): TROPIPOC in the last 168 hours.   BNPNo results for input(s): BNP, PROBNP in the last 168 hours.   DDimer No results for input(s): DDIMER in the last 168 hours.   Radiology    No results found.  Cardiac Studies   N/a  Patient Profile     84 y.o. male PMH including HTN, HL, DMII, CKD IV, obesity, and murmur. Found to have severe AS and transferred to [facility] for further work up regarding TAVR.   Assessment & Plan    1.  Acute heart failure in the setting of severe aortic stenosis: Patient presented to [facility] with a 3-week history of progressive dyspnea and subsequent finding of lower extremity edema by primary care.  Unfortunately, in the setting of stage IV chronic kidney disease, symptoms were recalcitrant to oral Lasix therapy prompting admission.  Received IV lasix prior to admission. Attempted to hold diuretic and ACEi to improve renal function. More dyspneic while at rest this morning.  -- defer cath until tomorrow -- Lasix 40mg  IV x1 now -- echo pending  2.  Essential hypertension: Stable.  Continue home meds but holding ACE inhibitor.  3.  Hyperlipidemia: Continue statin therapy.  4.  Type 2 diabetes mellitus: We will continue sliding scale insulin.  He was on metformin at home which is held  5.  Stage IV chronic kidney disease:  Holding ACE inhibitor as above. Cr still 2.9 today. -- recheck in the am. May require nephrology consultation.  He does not currently have an outpatient nephrologist but was referred recently.  6.  Anemia: H&H 8.6 and 28 on admission. Hgb 8 this morning. No bleeding noted.  -- Follow-up in a.m.  Likely secondary to chronic disease.  7.  Candida infection: Groin is red and moist. Nystatin ordered.  For questions or updates, please contact [place] Please consult [email or web address] for contact info under   Signed, Reino Bellis, NP  09/12/2018, 9:08 AM    Patient seen, examined. Available data reviewed. Agree with findings, assessment, and plan as outlined by Reino Bellis, NP.  The patient is independently interviewed and examined.  He is an alert, oriented, elderly male in no distress.  He is sitting up at the bedside chair.  Lung fields are diminished in the bases but otherwise clear.  Heart is regular rate and rhythm with a grade 3/6 harsh late peaking systolic murmur at the right upper sternal border with no diastolic murmur.  Extremities have mild edema.  Abdomen is soft and nontender with no rebound, guarding, or masses.  JVP is mildly elevated.  Complex patient with severe aortic stenosis, heart failure, and acute on chronic kidney injury.  He needs right and left heart catheterization to evaluate hemodynamics and define coronary anatomy.  His baseline creatinine appears to be around 2.5 mg/dL.  His creatinine is mildly elevated above that at 2.9 this morning.  In addition, he has clinical exam evidence of heart failure and he received an ACE inhibitor yesterday.  This is now being held and we are treating him with IV furosemide today.  I think it is best to repeat his labs in the morning and tentatively plan on limited contrast diagnostic cardiac catheterization tomorrow with just coronary angiography.  We will reassess him in the morning.  Considering his age, severe aortic stenosis,  congestive heart failure, and baseline renal disease, I am going to request a renal consultation.  This patient is going to need further diagnostic testing that involves administration of iodinated contrast.  We plan to only perform limited contrast catheterization while here in the hospital and then will stage CTA studies in anticipation of TAVR.  Suspect it will be best to indefinitely hold his ACE inhibitor through this process.  Sherren Mocha, M.D. 09/12/2018 1:25 PM

## 2018-09-12 NOTE — Progress Notes (Signed)
Unit nurse will give paperwork to patient with instructions on how to proceed.  Chaplain dept. Not currently doing AD until further notice.  Jaclynn Major, [place], [facility], Pager [phone]

## 2018-09-13 ENCOUNTER — Encounter (HOSPITAL_COMMUNITY)
Admission: AD | Disposition: A | Discharge status: Home Health | Payer: Self-pay | Source: Other Acute Inpatient Hospital | Attending: Cardiovascular Disease

## 2018-09-13 DIAGNOSIS — Z66 Do not resuscitate: Secondary | ICD-10-CM

## 2018-09-13 DIAGNOSIS — Z515 Encounter for palliative care: Secondary | ICD-10-CM

## 2018-09-13 DIAGNOSIS — Z7189 Other specified counseling: Secondary | ICD-10-CM

## 2018-09-13 DIAGNOSIS — N179 Acute kidney failure, unspecified: Secondary | ICD-10-CM

## 2018-09-13 LAB — RENAL FUNCTION PANEL
Albumin: 2.8 g/dL — ABNORMAL LOW (ref 3.5–5.0)
Anion gap: 5 (ref 5–15)
BUN: 73 mg/dL — ABNORMAL HIGH (ref 8–23)
CO2: 29 mmol/L (ref 22–32)
Calcium: 8.5 mg/dL — ABNORMAL LOW (ref 8.9–10.3)
Chloride: 109 mmol/L (ref 98–111)
Creatinine, Ser: 2.85 mg/dL — ABNORMAL HIGH (ref 0.61–1.24)
GFR calc Af Amer: 23 mL/min — ABNORMAL LOW (ref >60)
GFR calc non Af Amer: 20 mL/min — ABNORMAL LOW (ref >60)
Glucose, Bld: 125 mg/dL — ABNORMAL HIGH (ref 70–99)
PHOSPHORUS: 4.4 mg/dL (ref 2.5–4.6)
Potassium: 3.6 mmol/L (ref 3.5–5.1)
Sodium: 143 mmol/L (ref 135–145)

## 2018-09-13 LAB — KAPPA/LAMBDA LIGHT CHAINS
Kappa free light chain: 75 mg/L — ABNORMAL HIGH (ref 3.3–19.4)
Kappa, lambda light chain ratio: 1.32 (ref 0.26–1.65)
Lambda free light chains: 56.8 mg/L — ABNORMAL HIGH (ref 5.7–26.3)

## 2018-09-13 LAB — IMMUNOFIXATION ELECTROPHORESIS
IgA: 295 mg/dL (ref 61–437)
IgG (Immunoglobin G), Serum: 668 mg/dL — ABNORMAL LOW (ref 700–1600)
IgM (Immunoglobulin M), Srm: 107 mg/dL (ref 15–143)
Total Protein ELP: 5.7 g/dL — ABNORMAL LOW (ref 6.0–8.5)

## 2018-09-13 LAB — IMMUNOFIXATION, URINE

## 2018-09-13 LAB — CBC
HCT: 27.1 % — ABNORMAL LOW (ref 39.0–52.0)
Hemoglobin: 7.9 g/dL — ABNORMAL LOW (ref 13.0–17.0)
MCH: 25.4 pg — ABNORMAL LOW (ref 26.0–34.0)
MCHC: 29.2 g/dL — ABNORMAL LOW (ref 30.0–36.0)
MCV: 87.1 fL (ref 80.0–100.0)
PLATELETS: 145 10*3/uL — AB (ref 150–400)
RBC: 3.11 MIL/uL — ABNORMAL LOW (ref 4.22–5.81)
RDW: 17.8 % — ABNORMAL HIGH (ref 11.5–15.5)
WBC: 7.3 10*3/uL (ref 4.0–10.5)
nRBC: 0 % (ref 0.0–0.2)

## 2018-09-13 LAB — GLUCOSE, CAPILLARY
Glucose-Capillary: 117 mg/dL — ABNORMAL HIGH (ref 70–99)
Glucose-Capillary: 166 mg/dL — ABNORMAL HIGH (ref 70–99)
Glucose-Capillary: 111 mg/dL — ABNORMAL HIGH (ref 70–99)
Glucose-Capillary: 149 mg/dL — ABNORMAL HIGH (ref 70–99)

## 2018-09-13 LAB — FOLATE RBC
Folate, Hemolysate: >620 ng/mL
Folate, RBC: >2279 ng/mL (ref >498)
Hematocrit: 27.2 % — ABNORMAL LOW (ref 37.5–51.0)

## 2018-09-13 LAB — VITAMIN D 25 HYDROXY (VIT D DEFICIENCY, FRACTURES): Vit D, 25-Hydroxy: 42.1 ng/mL (ref 30.0–100.0)

## 2018-09-13 LAB — PARATHYROID HORMONE, INTACT (NO CA): PTH: 31 pg/mL (ref 15–65)

## 2018-09-13 SURGERY — RIGHT/LEFT HEART CATH AND CORONARY ANGIOGRAPHY
Anesthesia: LOCAL

## 2018-09-13 MED ORDER — SODIUM CHLORIDE 0.9 % IV SOLN
510.0000 mg | INTRAVENOUS | Status: DC
  Administered 2018-09-13: 510 mg via INTRAVENOUS
  Filled 2018-09-13: qty 17

## 2018-09-13 MED ORDER — DARBEPOETIN ALFA 100 MCG/0.5ML IJ SOSY
100.0000 ug | PREFILLED_SYRINGE | INTRAMUSCULAR | Status: DC
  Administered 2018-09-13: 100 ug via SUBCUTANEOUS
  Filled 2018-09-13: qty 0.5

## 2018-09-13 MED ORDER — POTASSIUM CHLORIDE CRYS ER 20 MEQ PO TBCR
20.0000 meq | EXTENDED_RELEASE_TABLET | Freq: Two times a day (BID) | ORAL | Status: DC
  Administered 2018-09-13 – 2018-09-17 (×9): 20 meq via ORAL
  Filled 2018-09-13 (×6): qty 1
  Filled 2018-09-13: qty 2
  Filled 2018-09-13 (×2): qty 1

## 2018-09-13 MED ORDER — FUROSEMIDE 10 MG/ML IJ SOLN
40.0000 mg | Freq: Three times a day (TID) | INTRAMUSCULAR | Status: DC
  Administered 2018-09-13 – 2018-09-14 (×4): 40 mg via INTRAVENOUS
  Filled 2018-09-13 (×4): qty 4

## 2018-09-13 MED ORDER — FERROUS SULFATE 325 (65 FE) MG PO TABS
325.0000 mg | ORAL_TABLET | Freq: Two times a day (BID) | ORAL | Status: DC
  Administered 2018-09-13 (×2): 325 mg via ORAL
  Filled 2018-09-13 (×2): qty 1

## 2018-09-13 MED ORDER — SODIUM CHLORIDE 0.9 % IV SOLN
INTRAVENOUS | Status: DC
  Administered 2018-09-13: 06:00:00 via INTRAVENOUS

## 2018-09-13 MED ORDER — SENNOSIDES-DOCUSATE SODIUM 8.6-50 MG PO TABS: 1.0000 | ORAL_TABLET | Freq: Every evening | ORAL | Status: DC | PRN
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
## 2018-09-13 NOTE — Care Management (Addendum)
1650:  CM unable to reach daughters - pt was unsure how to reach daughters.  Bedside nurse will obtain contact information if family calls or comes to visit pt. Pt is a resident of [place] so CM reached out to the Palliative program with [facility] [place] and made tentative referral.   Will request  weekend CM to follow up    CM acknowledges consult for home oxygen however CM will need the following prior to home oxygen being set up; pulm ambulatory note and oxygen order needs to be modified (communicated with bedside nurse) - Please consult with CM tomorrow to find out specific needs with standing home oxygen order  1300:  CM attempted to speak with pt regarding community palliative choice however pt was incoherent with responses.

## 2018-09-13 NOTE — Progress Notes (Addendum)
Progress Note  Patient Name: Billy [name] Date of Encounter: 09/13/2018  Primary Cardiologist: No primary care provider on file.   Subjective   No complaints this morning.   Inpatient Medications    Scheduled Meds: . amLODipine  10 mg Oral Daily  . aspirin EC  81 mg Oral Daily  . atorvastatin  20 mg Oral Daily  . bisoprolol  5 mg Oral Daily  . buPROPion  150 mg Oral Daily  . doxazosin  4 mg Oral Daily  . escitalopram  10 mg Oral Daily  . heparin  5,000 Units Subcutaneous Q8H  . insulin aspart  0-15 Units Subcutaneous TID WC  . nystatin cream   Topical BID  . sodium chloride flush  3 mL Intravenous Q12H   Continuous Infusions: . sodium chloride     PRN Meds: sodium chloride, acetaminophen, LORazepam, ondansetron (ZOFRAN) IV, sodium chloride flush   Vital Signs    Vitals:   09/13/18 0100 09/13/18 0315 09/13/18 0411 09/13/18 0805  BP: (!) 137/58  (!) 150/54 140/66  Pulse:   69 69  Resp: 20  20 20   Temp: 98.4 F (36.9 C)  98.4 F (36.9 C) 98.1 F (36.7 C)  TempSrc: Oral  Oral Oral  SpO2: 90%  96% 97%  Weight:  123.4 kg    Height:        Intake/Output Summary (Last 24 hours) at 09/13/2018 0818 Last data filed at 09/13/2018 0800 Gross per 24 hour  Intake 799.63 ml  Output 1025 ml  Net -225.37 ml   Last 3 Weights 09/13/2018 09/12/2018 09/11/2018  Weight (lbs) 272 lb 272 lb 273 lb 6.4 oz  Weight (kg) 123.378 kg 123.378 kg 124.013 kg      Telemetry    SR - Personally Reviewed  ECG    N/a - Personally Reviewed  Physical Exam   GEN: No acute distress.   Neck: No JVD Cardiac: RRR, 3/6 systolic murmur, rubs, or gallops.  Respiratory: Clear to auscultation bilaterally. GI: Soft, nontender, non-distended  MS: No edema; No deformity. Neuro:  Nonfocal  Psych: Normal affect   Labs    Chemistry Recent Labs  Lab 09/11/18 2015 09/12/18 0445 09/13/18 0508  NA  --  141 143  K  --  3.5 3.6  CL  --  106 109  CO2  --  23 29  GLUCOSE  --  107* 125*   BUN  --  75* 73*  CREATININE 2.95* 2.91* 2.85*  CALCIUM  --  8.6* 8.5*  ALBUMIN  --   --  2.8*  GFRNONAA 19* 19* 20*  GFRAA 22* 22* 23*  ANIONGAP  --  12 5     Hematology Recent Labs  Lab 09/11/18 2015 09/12/18 0445 09/13/18 0508  WBC 9.5 8.8 7.3  RBC 3.54* 3.06* 3.11*  HGB 9.0* 8.0* 7.9*  HCT 31.4* 26.5* 27.1*  MCV 88.7 86.6 87.1  MCH 25.4* 26.1 25.4*  MCHC 28.7* 30.2 29.2*  RDW 17.8* 17.9* 17.8*  PLT 187 166 145*    Cardiac EnzymesNo results for input(s): TROPONINI in the last 168 hours. No results for input(s): TROPIPOC in the last 168 hours.   BNPNo results for input(s): BNP, PROBNP in the last 168 hours.   DDimer No results for input(s): DDIMER in the last 168 hours.   Radiology    US Renal  Result Date: 09/12/2018 CLINICAL DATA:  Acute renal failure EXAM: RENAL / URINARY TRACT ULTRASOUND COMPLETE COMPARISON:  None. FINDINGS: Right Kidney: Renal  measurements: 11.9 x 4.9 x 5.5 cm = volume: 168 mL. Inc negative for obstruction. 13 mm upper pole lesion most likely a cyst. Left Kidney: Renal measurements: 12.3 x 5.8 x 7.1 cm = volume: 269 mL. Hypoechoic mass in the renal hila measuring 5.4 x 4.5 cm. This is not well evaluated by ultrasound due to body habitus. This could represent a complex cyst or mass. This was present in 2017 and appears similar in appearance. Bladder: Appears normal for degree of bladder distention. IMPRESSION: Negative for hydronephrosis.  Normal renal size. 5.4 x 4.5 hypoechoic mass left renal hilum not ideally evaluated by ultrasound. The lesion is stable since prior ultrasound of 08/31/2015. Electronically Signed   By: Franchot Gallo M.D.   On: 09/12/2018 18:25    Cardiac Studies   TTE: 09/12/2018  IMPRESSIONS    1. The left ventricle has normal systolic function with an ejection fraction of 60-65%. The cavity size was normal. There is moderately increased left ventricular wall thickness. indeterminate due to moderate MAC.  2. The aortic  valve has an indeterminate number of cusps Moderate calcification of the aortic valve. Aortic valve regurgitation is trivial by color flow Doppler. severe stenosis of the aortic valve.  3. The right ventricle has normal systolic function. The cavity was mildly enlarged. There is no increase in right ventricular wall thickness.  4. Left atrial size was mildly dilated.  5. Right atrial size was mildly dilated.  6. The mitral valve is degenerative. There is moderate mitral annular calcification present. Mild mitral valve stenosis.  7. The inferior vena cava was dilated in size with <50% respiratory variability.  SUMMARY   LVEF 60-65%, moderate LVH, normal wall motion, calcified aortic valve with severe stenosis - mean gradient only 20 mmHg (suspect undersampled) -AVA around 1.0 cm2 (based on LVOT diameter of 2.2 cm), mild mitral stenosis (HR 68) with trivial MR, mild biatrial enlargment, no significant TR, dilated IVC that does not collapse  FINDINGS  Left Ventricle: The left ventricle has normal systolic function, with an ejection fraction of 60-65%. The cavity size was normal. There is moderately increased left ventricular wall thickness. indeterminate due to moderate MAC. Right Ventricle: The right ventricle has normal systolic function. The cavity was mildly enlarged. There is no increase in right ventricular wall thickness. Left Atrium: left atrial size was mildly dilated Right Atrium: right atrial size was mildly dilated. Right atrial pressure is estimated at 15 mmHg. Interatrial Septum: No atrial level shunt detected by color flow Doppler. Pericardium: There is no evidence of pericardial effusion. Mitral Valve: The mitral valve is degenerative in appearance. There is moderate mitral annular calcification present. Mitral valve regurgitation is trivial by color flow Doppler. Mild mitral valve stenosis. Tricuspid Valve: The tricuspid valve is normal in structure. Tricuspid valve regurgitation  was not visualized by color flow Doppler. Aortic Valve: The aortic valve has an indeterminate number of cusps Moderate calcification of the aortic valve. Aortic valve regurgitation is trivial by color flow Doppler. There is severe stenosis of the aortic valve, with a calculated valve area of  0.99 cm. Pulmonic Valve: The pulmonic valve was not well visualized. Pulmonic valve regurgitation is not visualized by color flow Doppler. Venous: The inferior vena cava measures 2.60 cm, is dilated in size with less than 50% respiratory variability.   Patient Profile     84 y.o. male with PMH including HTN, HL, DMII, CKD IV, obesity, and murmur. Found to have severe AS and transferred to [facility] for further  work up regarding TAVR.   Assessment & Plan    1. Acute heart failure in the setting of severe aortic stenosis: Patient presented to [facility] with a 3-week history of progressive dyspnea and subsequent finding of lower extremity edema by primary care. Unfortunately, in the setting of stage IV chronic kidney disease, symptoms were recalcitrant to oral Lasix therapy prompting admission. Received IV lasix prior to admission. Attempted to hold diuretic and ACEi to improve renal function. Needed cath cancelled yesterday, needed diuretic. Cr still elevated. Will hold on cath today, and allow him to eat.  -- echo with severe AS, valve are of 0.99  2. Essential hypertension: Stable. Continue home meds but holding ACE inhibitor.  3. Hyperlipidemia: Continue statin therapy.  4. Type 2 diabetes mellitus: We will continue sliding scale insulin. He was on metformin at home which is held  5. Stage IV chronic kidney disease: Holding ACE inhibitor as above. Cr 2.9>>2.85. -- seen by nephrology yesterday and recommendation for palliative care consult given co-morbities.   6. Anemia: 8.6>>8.0>>7.9. -- Iron 22, sat ratios 7. Will add Iron today --Follow H/H.  -Have requested a palliative care  consult today to further establish GOC with the patient and family.   For questions or updates, please contact [place] Please consult [email or web address] for contact info under   Signed, Reino Bellis, NP  09/13/2018, 8:18 AM    Patient seen, examined. Available data reviewed. Agree with findings, assessment, and plan as outlined by Reino Bellis, NP.  The patient is sitting up in the chair at the bedside.  He is an elderly male in no distress.  JVP is moderately elevated, heart is regular rate and rhythm with a grade 3/6 harsh late peaking systolic murmur at the right upper sternal border, lungs are clear, abdomen is obese and nontender, extremities have 1+ pedal edema.  The patient's creatinine is unchanged at 2.9.  Nephrology and palliative care notes have been reviewed.  I had a frank discussion with the patient today about the prospects of causing contrast nephropathy if we proceed with cardiac catheterization and CTA studies that would be required as part of a pre-TAVR work-up.  The patient seems very clear that he would not want to be on dialysis and would favor a palliative approach to his treatment.  With that being the case, I do not think we should work him up any further.  I will start him on torsemide for oral diuretic therapy and would anticipate treating him medically in a palliative fashion.  He is eager to go back home.  I think we should set him up with home oxygen as his oxygen saturations are under 90% without supplemental O2.  I tried to call his daughters today but I could not get through to either 1 of them.  I briefly talked to his sister over the telephone.  We will work on getting him discharged as soon as arrangements can be made.  Sherren Mocha, M.D. 09/13/2018 3:29 PM

## 2018-09-13 NOTE — Progress Notes (Signed)
SATURATION QUALIFICATIONS: (This note is used to comply with regulatory documentation for home oxygen)  Patient Saturations on Room Air at Rest = 93%  Patient Saturations on Room Air while Ambulating = 88%  Patient Saturations on 2 Liters of oxygen while Ambulating = 96%  Please briefly explain why patient needs home oxygen: 

## 2018-09-13 NOTE — Plan of Care (Signed)
Discussed with patient plan of care for the evening, pain management and sitting up in the chair with some teach back displayed

## 2018-09-13 NOTE — Evaluation (Addendum)
Occupational Therapy Evaluation Patient Details Name: Billy [name] MRN: [id number] DOB: 01-Mar-1935 Today's Date: 09/13/2018    History of Present Illness 84 y.o. male with PMH including HTN, HL, DMII, CKD IV, obesity, and murmur.Found to have severe AS, Cardiac cath on hold due to elevated creatinine 2.85.    Clinical Impression   PTA Pt living at home alone with assist from daughters (live close by) and aide (see Palliative note about trouble with Aide) for assist with IADL/medicine management. Today pt was able to perform transfers at min guard, peri care with set up, and sink level grooming at min guard. Pt presents with decreased safety awareness, cognition (found in BM and unaware) and at this time Pt will require continued skilled OT in the acute setting. I feel very strongly that he should have 24 hour supervision at discharge. SNF is the BEST option for this patient as he can improve his balance, activity tolerance, and maximize independence and safety - however I feel like he is very likely to refuse this. Once again, I feel like he will need 24 hour supervision for safety. Next session, plan on cognitive assessment.   Of note: Pt on RA (had taken of [place]) and was at 84%, with 1 LO2 returned to 93%. Pt was complaining that the tubing was hurting his ears.- redness noted at the top of his ears where the tubing rests. Gauze/padding placed around tubing and Pt reports it relieved the pressure. RN notified of saturations and also of redness around ears for pressure awareness.     Follow Up Recommendations  SNF;Supervision/Assistance - 24 hour    Equipment Recommendations  None recommended by OT(Pt has appropriate DME)    Recommendations for Other Services PT consult     Precautions / Restrictions Precautions Precautions: Fall Precaution Comments: watch O2 Restrictions Weight Bearing Restrictions: No      Mobility Bed Mobility               General bed mobility comments:  OOB in recliner at beginning and end of session  Transfers Overall transfer level: Needs assistance Equipment used: Rolling walker (2 wheeled)                  Balance Overall balance assessment: Needs assistance Sitting-balance support: No upper extremity supported;Feet supported Sitting balance-Leahy Scale: Good     Standing balance support: Bilateral upper extremity supported;During functional activity Standing balance-Leahy Scale: Poor Standing balance comment: dependent on UE support                           ADL either performed or assessed with clinical judgement   ADL Overall ADL's : Needs assistance/impaired Eating/Feeding: Set up;Sitting   Grooming: Min guard;Standing;Wash/dry hands   Upper Body Bathing: Sitting;Supervision/ safety   Lower Body Bathing: Min guard;Sitting/lateral leans Lower Body Bathing Details (indicate cue type and reason): able to reach down to ankles Upper Body Dressing : Set up;Sitting   Lower Body Dressing: Min guard;Sit to/from stand   Toilet Transfer: Min guard;RW;BSC;Ambulation;Cueing for Office manager Details (indicate cue type and reason): vc for safety with RW, use of grab bars and requires higher toilet (BSC over toilet used Toileting- Clothing Manipulation and Hygiene: Set up [place] Manipulation Details (indicate cue type and reason): Pt provided with warm wash clothes and he was able to perform rear peri care     Functional mobility during ADLs: Min guard;Rolling walker;Cueing for safety  Vision Patient Visual Report: No change from baseline       Perception     Praxis      Pertinent Vitals/Pain Pain Assessment: No/denies pain     Hand Dominance Right   Extremity/Trunk Assessment Upper Extremity Assessment Upper Extremity Assessment: Generalized weakness   Lower Extremity Assessment Lower Extremity Assessment: Generalized weakness       Communication  Communication Communication: No difficulties   Cognition Arousal/Alertness: Awake/alert Behavior During Therapy: WFL for tasks assessed/performed Overall Cognitive Status: Impaired/Different from baseline Area of Impairment: Safety/judgement;Problem solving;Awareness                         Safety/Judgement: Decreased awareness of deficits Awareness: Emergent Problem Solving: Requires verbal cues General Comments: Pt incontinent of BM while sitting in chair and unaware, question his safety cognitively going home as he lives alone   General Comments  Pt sitting with [place] out of nose when OT entered, his O2 levels were 84%. I asked him why and he explained that his ears were hurting him from the tubing. He did have red marks that looked like pressure marks from tubing on ears. I wrapped gauze around the tubing to provide more cushioning and he stated "That feels much better" RN/NT also notified. After O2 had been on his saturations returned to 93% on 1 LO2    Exercises     Shoulder Instructions      Home Living Family/patient expects to be discharged to:: Private residence Living Arrangements: Alone Available Help at Discharge: Family;Available PRN/intermittently Type of Home: Mobile home Home Access: Ramped entrance     Home Layout: One level     Bathroom Shower/Tub: Occupational psychologist: Handicapped height Bathroom Accessibility: No   Home Equipment: Environmental consultant - 2 wheels;Walker - 4 wheels;Cane - single point;Shower seat - built in;Grab bars - tub/shower;Grab bars - toilet          Prior Functioning/Environment Level of Independence: Needs assistance  Gait / Transfers Assistance Needed: uses SPC for mobility in the home ADL's / Homemaking Assistance Needed: family and aide were helping with cleaning and food   Comments: Pt in unreliable historian        OT Problem List: Decreased activity tolerance;Impaired balance (sitting and/or  standing);Decreased safety awareness;Decreased cognition;Obesity;Increased edema      OT Treatment/Interventions: Self-care/ADL training;DME and/or AE instruction;Therapeutic activities;Cognitive remediation/compensation;Patient/family education;Balance training    OT Goals(Current goals can be found in the care plan section) Acute Rehab OT Goals Patient Stated Goal: "I just want to go home" OT Goal Formulation: With patient Time For Goal Achievement: 09/27/18 Potential to Achieve Goals: Good ADL Goals Pt Will Perform Grooming: with modified independence;standing Pt Will Perform Upper Body Dressing: with modified independence;sitting Pt Will Perform Lower Body Dressing: with modified independence;sit to/from stand Pt Will Transfer to Toilet: with modified independence;ambulating;grab bars Pt Will Perform Toileting - Clothing Manipulation and hygiene: with modified independence;sitting/lateral leans  OT Frequency: Min 3X/week   Barriers to D/C: Decreased caregiver support  Pt lives alone and is inbetween caregivers/aides       Co-evaluation              AM-PAC OT "6 Clicks" Daily Activity     Outcome Measure Help from another person eating meals?: None Help from another person taking care of personal grooming?: A Little Help from another person toileting, which includes using toliet, bedpan, or urinal?: A Little Help from another person bathing (including  washing, rinsing, drying)?: A Little Help from another person to put on and taking off regular upper body clothing?: A Little Help from another person to put on and taking off regular lower body clothing?: A Little 6 Click Score: 19   End of Session Equipment Utilized During Treatment: Gait belt;Rolling walker;Oxygen(1L) Nurse Communication: Mobility status;Other (comment)(pain/marks on ears from tubing)  Activity Tolerance: Patient tolerated treatment well Patient left: in chair;with call bell/phone within reach;with  chair alarm set  OT Visit Diagnosis: Unsteadiness on feet (R26.81);Other abnormalities of gait and mobility (R26.89);Muscle weakness (generalized) (M62.81);Other symptoms and signs involving cognitive function                Time: 2902-1115 OT Time Calculation (min): 44 min Charges:  OT General Charges $OT Visit: 1 Visit OT Evaluation $OT Eval Moderate Complexity: 1 Mod OT Treatments $Self Care/Home Management : 8-22 mins  Hulda Humphrey OTR/L Acute Rehabilitation Services Pager: [phone] Office: [place] 09/13/2018, 5:16 PM

## 2018-09-13 NOTE — Progress Notes (Signed)
Subjective:  1200 UOP - crt stable to slightly improved but still poor.  He is still SOB but he thinks better.  Spoke more about his baseline functional status- mainly stays in the house- someone else does his shopping, doesn't think he could walk a block   Objective Vital signs in last 24 hours: Vitals:   09/13/18 0315 09/13/18 0411 09/13/18 0805 09/13/18 0909  BP:  (!) 150/54 140/66 (!) 144/53  Pulse:  69 69 66  Resp:  20 20   Temp:  98.4 F (36.9 C) 98.1 F (36.7 C)   TempSrc:  Oral Oral   SpO2:  96% 97% 94%  Weight: 123.4 kg     Height:       Weight change: -0.635 kg  Intake/Output Summary (Last 24 hours) at 09/13/2018 0916 Last data filed at 09/13/2018 0800 Gross per 24 hour  Intake 799.63 ml  Output 1025 ml  Net -225.37 ml    Assessment/Plan: 1.  AKI/CKD stage 4- in setting of decompensated CHF due to severe aortic stenosis with diuresis and concomitant ACE inhibition.  Agree with holding ace and resuming lasix given his worsening dyspnea off of diuretics- have started lasix 40 IV q 8.  Difficult situation as he is a poor candidate for longterm dialysis given his multiple co-morbidities and poor functional status.  I am not sure the possible renal failure from contrast exposure would be temporary given his advanced CKD at baseline.  I am very nervous for him at the prospect of attempting to fix this valve and the cost of that for him, meaning worsening renal function.   Palliative care consult to help set goals/limits of care would be helpful before proceeding further and just try to treat medically for now 2. Acute heart failure due to severe aortic stenosis- Cardiology evaluating patient and agree with IV lasix and follow his response.   3. HTN- stable off of lisinopril. Would not resume at all , BP should improve as volume status improves  4. Hyperlipidemia- on statin therapy 5. DM type 2- hold metformin due to CKD stage 4 and follow 6. Anemia of CKD-  iron stores low, will  give feraheme  and start ESA therapy 7. SHPTH-  vit D level good and iPTH pending  8. Hypokalemia- start a little K supp    Louis Meckel    Labs: Basic Metabolic Panel: Recent Labs  Lab 09/11/18 2015 09/12/18 0445 09/13/18 0508  NA  --  141 143  K  --  3.5 3.6  CL  --  106 109  CO2  --  23 29  GLUCOSE  --  107* 125*  BUN  --  75* 73*  CREATININE 2.95* 2.91* 2.85*  CALCIUM  --  8.6* 8.5*  PHOS  --   --  4.4   Liver Function Tests: Recent Labs  Lab 09/13/18 0508  ALBUMIN 2.8*   No results for input(s): LIPASE, AMYLASE in the last 168 hours. No results for input(s): AMMONIA in the last 168 hours. CBC: Recent Labs  Lab 09/11/18 2015 09/12/18 0445 09/13/18 0508  WBC 9.5 8.8 7.3  NEUTROABS  --  7.1  --   HGB 9.0* 8.0* 7.9*  HCT 31.4* 26.5* 27.1*  MCV 88.7 86.6 87.1  PLT 187 166 145*   Cardiac Enzymes: No results for input(s): CKTOTAL, CKMB, CKMBINDEX, TROPONINI in the last 168 hours. CBG: Recent Labs  Lab 09/12/18 0605 09/12/18 1125 09/12/18 1703 09/12/18 2103 09/13/18 0554  GLUCAP 102*  171* 109* 102* 117*    Iron Studies:  Recent Labs    09/12/18 1651  IRON 22*  TIBC 323  FERRITIN 65   Studies/Results: US Renal  Result Date: 09/12/2018 CLINICAL DATA:  Acute renal failure EXAM: RENAL / URINARY TRACT ULTRASOUND COMPLETE COMPARISON:  None. FINDINGS: Right Kidney: Renal measurements: 11.9 x 4.9 x 5.5 cm = volume: 168 mL. Inc negative for obstruction. 13 mm upper pole lesion most likely a cyst. Left Kidney: Renal measurements: 12.3 x 5.8 x 7.1 cm = volume: 269 mL. Hypoechoic mass in the renal hila measuring 5.4 x 4.5 cm. This is not well evaluated by ultrasound due to body habitus. This could represent a complex cyst or mass. This was present in 2017 and appears similar in appearance. Bladder: Appears normal for degree of bladder distention. IMPRESSION: Negative for hydronephrosis.  Normal renal size. 5.4 x 4.5 hypoechoic mass left renal hilum not  ideally evaluated by ultrasound. The lesion is stable since prior ultrasound of 08/31/2015. Electronically Signed   By: Franchot Gallo M.D.   On: 09/12/2018 18:25   Medications: Infusions: . sodium chloride      Scheduled Medications: . amLODipine  10 mg Oral Daily  . aspirin EC  81 mg Oral Daily  . atorvastatin  20 mg Oral Daily  . bisoprolol  5 mg Oral Daily  . buPROPion  150 mg Oral Daily  . doxazosin  4 mg Oral Daily  . escitalopram  10 mg Oral Daily  . ferrous sulfate  325 mg Oral BID WC  . heparin  5,000 Units Subcutaneous Q8H  . insulin aspart  0-15 Units Subcutaneous TID WC  . nystatin cream   Topical BID  . sodium chloride flush  3 mL Intravenous Q12H    have reviewed scheduled and prn medications.  Physical Exam: General: obese, some SOB at rest  Heart: RRR Lungs: dec BS ar bases, shallow breaths Abdomen: obese Extremities: pitting edema-  Condom cath    09/13/2018,9:16 AM  LOS: 2 days

## 2018-09-14 LAB — BASIC METABOLIC PANEL
Anion gap: 9 (ref 5–15)
BUN: 76 mg/dL — ABNORMAL HIGH (ref 8–23)
CO2: 24 mmol/L (ref 22–32)
Calcium: 8.3 mg/dL — ABNORMAL LOW (ref 8.9–10.3)
Chloride: 109 mmol/L (ref 98–111)
Creatinine, Ser: 2.92 mg/dL — ABNORMAL HIGH (ref 0.61–1.24)
GFR calc Af Amer: 22 mL/min — ABNORMAL LOW (ref >60)
GFR calc non Af Amer: 19 mL/min — ABNORMAL LOW (ref >60)
GLUCOSE: 121 mg/dL — AB (ref 70–99)
Potassium: 3.9 mmol/L (ref 3.5–5.1)
Sodium: 142 mmol/L (ref 135–145)

## 2018-09-14 LAB — HEPATIC FUNCTION PANEL
ALT: 15 U/L (ref 0–44)
AST: 19 U/L (ref 15–41)
Albumin: 2.8 g/dL — ABNORMAL LOW (ref 3.5–5.0)
Alkaline Phosphatase: 49 U/L (ref 38–126)
BILIRUBIN INDIRECT: 0.5 mg/dL (ref 0.3–0.9)
Bilirubin, Direct: 0.2 mg/dL (ref 0.0–0.2)
Total Bilirubin: 0.7 mg/dL (ref 0.3–1.2)
Total Protein: 5.7 g/dL — ABNORMAL LOW (ref 6.5–8.1)

## 2018-09-14 LAB — CBC
HEMATOCRIT: 27 % — AB (ref 39.0–52.0)
Hemoglobin: 7.9 g/dL — ABNORMAL LOW (ref 13.0–17.0)
MCH: 25.3 pg — ABNORMAL LOW (ref 26.0–34.0)
MCHC: 29.3 g/dL — ABNORMAL LOW (ref 30.0–36.0)
MCV: 86.5 fL (ref 80.0–100.0)
Platelets: 139 10*3/uL — ABNORMAL LOW (ref 150–400)
RBC: 3.12 MIL/uL — ABNORMAL LOW (ref 4.22–5.81)
RDW: 17.5 % — ABNORMAL HIGH (ref 11.5–15.5)
WBC: 6.4 10*3/uL (ref 4.0–10.5)
nRBC: 0 % (ref 0.0–0.2)

## 2018-09-14 LAB — HEMOGLOBIN A1C
Hgb A1c MFr Bld: 6.1 % — ABNORMAL HIGH (ref 4.8–5.6)
Mean Plasma Glucose: 128.37 mg/dL

## 2018-09-14 LAB — LIPID PANEL
CHOL/HDL RATIO: 3.4 ratio
Cholesterol: 91 mg/dL (ref 0–200)
HDL: 27 mg/dL — ABNORMAL LOW (ref >40)
LDL Cholesterol: 46 mg/dL (ref 0–99)
TRIGLYCERIDES: 91 mg/dL (ref <150)
VLDL: 18 mg/dL (ref 0–40)

## 2018-09-14 LAB — GLUCOSE, CAPILLARY
GLUCOSE-CAPILLARY: 106 mg/dL — AB (ref 70–99)
Glucose-Capillary: 134 mg/dL — ABNORMAL HIGH (ref 70–99)
Glucose-Capillary: 108 mg/dL — ABNORMAL HIGH (ref 70–99)
Glucose-Capillary: 143 mg/dL — ABNORMAL HIGH (ref 70–99)

## 2018-09-14 MED ORDER — FERROUS SULFATE 325 (65 FE) MG PO TABS
325.0000 mg | ORAL_TABLET | Freq: Two times a day (BID) | ORAL | Status: DC
  Administered 2018-09-14 – 2018-09-17 (×7): 325 mg via ORAL
  Filled 2018-09-14 (×8): qty 1

## 2018-09-14 MED ORDER — FUROSEMIDE 10 MG/ML IJ SOLN
80.0000 mg | Freq: Three times a day (TID) | INTRAMUSCULAR | Status: DC
  Administered 2018-09-14 – 2018-09-15 (×2): 80 mg via INTRAVENOUS
  Filled 2018-09-14 (×3): qty 8

## 2018-09-14 NOTE — Progress Notes (Signed)
Family contact information:  Daughters: [name] [phone] [facility]) and [phone] (cell) she prefers to be called on home phone first.   Almyra Free [phone]  I will also place in demographics.

## 2018-09-14 NOTE — Progress Notes (Signed)
Spoke to Palliative nurse-  Reported that patient's mobile home is being moved to a new location on Monday.  May not be ready to move into until Tuesday.

## 2018-09-14 NOTE — Progress Notes (Signed)
Daily Progress Note   Patient Name: Billy [name]       Date: 09/14/2018 DOB: February 15, 1935  Age: 84 y.o. MRN#: [id number] Attending Physician: Sherren Mocha, MD Primary Care Physician: Patient, No Pcp Per Admit Date: 09/11/2018  Reason for Consultation/Follow-up: Establishing goals of care  Subjective: Patient sitting up in recliner watching tv. No family present. Denied pain, but does endorse shortness of breath at times. Reports he had a good breakfast and slept well during the night. He remembers me from yesterday and can relay some of the information discussed yesterday. Continues to confirm DNR/DNI. Expressing he is hopeful he can get home soon. We called his daughter, Billy [name] via phone chat so that he could see and speak with her and other family members. Daughter expressed she was saddened she was unable to visit at this time due to her own illnesses and precautions from virus (Covid-19). She reports receiving a call from Palliative/Hospice RN this morning which made her and her siblings tearful. She state they are in agreement with plans and she is awaiting to hear back from the nurse regarding setting up home visit appointment once patient is discharged.  Billy [name] states "I know they are doing everything they can for me and they can only do so much. I just want to get home to family and enjoy time outside of here!" Support given. Billy [name], expressed a company has come to place their mobile home on wheels today with plans of moving to their 7 acre land on Monday around noon. She expressed hopes patient would remain hospitalized until then as all of his belongings etc. Have been secured in order to relocate his home. Patient verbalized understanding of changes at the home.   Again, discussed with  daughter current treatments per providers will continue to be a more palliative approach. Billy [name] has made it clear to myself and his family he is not interested in long-term dialysis or heroic measures. Billy [name], verbalized understanding and support.   Length of Stay: 3  Current Medications: Scheduled Meds:  . amLODipine  10 mg Oral Daily  . aspirin EC  81 mg Oral Daily  . atorvastatin  20 mg Oral Daily  . bisoprolol  5 mg Oral Daily  . buPROPion  150 mg Oral Daily  . darbepoetin (ARANESP) injection - NON-DIALYSIS  100 mcg Subcutaneous Q Fri-1800  . doxazosin  4 mg Oral Daily  . escitalopram  10 mg Oral Daily  . ferrous sulfate  325 mg Oral BID WC  . furosemide  80 mg Intravenous Q8H  . heparin  5,000 Units Subcutaneous Q8H  . insulin aspart  0-15 Units Subcutaneous TID WC  . nystatin cream   Topical BID  . potassium chloride  20 mEq Oral BID  . sodium chloride flush  3 mL Intravenous Q12H    Continuous Infusions: . sodium chloride    . ferumoxytol Stopped (09/13/18 1235)    PRN Meds: sodium chloride, acetaminophen, LORazepam, ondansetron (ZOFRAN) IV, senna-docusate, sodium chloride flush  Physical Exam Vitals signs and nursing note reviewed.  Constitutional:      General: He is awake.     Appearance: He is obese.  Cardiovascular:     Rate and Rhythm: Regular rhythm. Bradycardia present.     Pulses: Normal pulses.     Heart sounds: Murmur present.  Pulmonary:     Breath sounds: Decreased breath sounds present.     Comments: Shortness of breath at times, 2L/Optima  Abdominal:     General: Bowel sounds are normal.     Palpations: Abdomen is soft.  Skin:    General: Skin is warm and dry.  Neurological:     Mental Status: He is alert and oriented to person, place, and time.     Motor: Weakness present.  Psychiatric:        Attention and Perception: Attention normal.        Mood and Affect: Mood normal.        Speech: Speech normal.        Behavior: Behavior is  cooperative.        Cognition and Memory: Cognition normal.        Judgment: Judgment normal.     Comments: forgetful              Vital Signs: BP 104/65 (BP Location: Right Arm)   Pulse (!) 57   Temp 97.9 F (36.6 C) (Oral)   Resp 19   Ht 6' (1.829 m)   Wt 123.8 kg Comment: scale B  SpO2 96%   BMI 37.03 kg/m  SpO2: SpO2: 96 % O2 Device: O2 Device: Nasal Cannula O2 Flow Rate: O2 Flow Rate (L/min): 1 L/min  Intake/output summary:   Intake/Output Summary (Last 24 hours) at 09/14/2018 1328 Last data filed at 09/14/2018 0913 Gross per 24 hour  Intake 724.44 ml  Output 1625 ml  Net -900.56 ml   LBM: Last BM Date: 09/12/18 Baseline Weight: Weight: 124 kg Most recent weight: Weight: 123.8 kg(scale B)       Palliative Assessment/Data: PPS 50%   Flowsheet Rows     Most Recent Value  Intake Tab  Referral Department  Cardiology  Unit at Time of Referral  Cardiac/Telemetry Unit  Palliative Care Primary Diagnosis  Cardiac  Date Notified  09/13/18  Palliative Care Type  New Palliative care  Reason for referral  Clarify Goals of Care  Date of Admission  09/11/18  Date first seen by Palliative Care  09/13/18  # of days Palliative referral response time  0 Day(s)  # of days IP prior to Palliative referral  2  Clinical Assessment  Palliative Performance Scale Score  40%  Psychosocial & Spiritual Assessment  Social Work Plan of Care  Clarified patient/family wishes with healthcare team  Palliative Care Outcomes  Patient/Family meeting held?  Yes  Who was at the meeting?  patient, spoke with daughter Billy [name] via phone   Palliative Care Outcomes  Clarified goals of care, Provided psychosocial or spiritual support, Changed CPR status, Linked to palliative care logitudinal support  Palliative Care follow-up planned  Yes, Home  Palliative Care Follow-up Reason  Other (comment)  Other Treatment Preference Instructions  support, symptom management, goals of care      Patient Active  Problem List   Diagnosis Date Noted  . Severe aortic stenosis 09/11/2018  . Diabetes ([place]) 07/23/2013  . Multinodular goiter 07/23/2013  . Other and unspecified hyperlipidemia 07/23/2013  . Essential hypertension, benign 07/23/2013  . 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 Urinary incontinence, post-void dribbling 07/23/2013    Palliative Care Assessment & Plan   Patient Profile: 84 y.o. male admitted on 09/11/2018 from [facility] due to complaints of shortness of breath, volume overload and a new finding of severe aortic stenosis. He was transferred for further cardiology work-up and evaluation for TAVR. Patient reports he does not regularly follow up with a cardiologist. He presented [facility] to [facility] ED with complaints of shortness of breath. He denied chest pain. He was seen by his PCP on 3/11 and creatinine was 3.42. He later followed up on 3/13 and lasix was reduced and he was referred to cardiology for follow up. During his ED course at [facility] he was found hypertensive, CR 3.2, BNP 1700, Chest x-ray showed low lung volumes with hazy lung densities. He was given IV lasix. Since admission lower extremity swelling and dyspnea is resolving, however patient continues to have worsening renal function. He is being seen by Cardiology and Nephrology. Cardiac cath on hold due to elevated creatinine 2.85. Palliative Medicine team consulted for goals of care.   Recommendations/Plan:  DNR/DNI-signed out of facility DNR form on chart  Continue to treat   Home with outpatient palliative with awareness of opportunity to transition to hospice once patient/family feels appropriate.   PMT will continue to support and follow.   Goals of Care and Additional Recommendations:  Limitations on Scope of Treatment: No Artificial Feeding, No Hemodialysis and No Tracheostomy, continue to treat without escalation of care.   Code Status:    Code Status Orders  (From admission,  onward)         Start     Ordered   09/13/18 1215  Do not attempt resuscitation (DNR)  Continuous    Question Answer Comment  In the event of cardiac or respiratory ARREST Do not call a "code blue"   In the event of cardiac or respiratory ARREST Do not perform Intubation, CPR, defibrillation or ACLS   In the event of cardiac or respiratory ARREST Use medication by any route, position, wound care, and other measures to relive pain and suffering. May use oxygen, suction and manual treatment of airway obstruction as needed for comfort.      09/13/18 1214        Code Status History    Date Active Date Inactive Code Status Order ID Comments User Context   09/11/2018 1904 09/13/2018 1214 Full Code [id number]  Theora Gianotti, NP Inpatient      Prognosis:   Guarded to Poor   Discharge Planning:  Home with Palliative Services  Care plan was discussed with patient's daughter, Billy [name] and bedside RN.   Thank you for allowing the Palliative Medicine Team to assist in the care of this patient.   Total Time 35 min.  Prolonged Time  Billed NO       Greater than 50%  of this time was spent counseling and coordinating care related to the above assessment and plan.  Alda Lea, AGPCNP-BC Palliative Medicine Team  Pager: [phone] Amion: Bjorn Pippin   Please contact Palliative Medicine Team phone at [phone] for questions and concerns.

## 2018-09-14 NOTE — Progress Notes (Signed)
Subjective:  1000 UOP - crt stable to slightly worse and still poor.  He is still SOB "this is all greek to me"  But is appreciative that we are all trying the best for him    Objective Vital signs in last 24 hours: Vitals:   09/13/18 2125 09/14/18 0430 09/14/18 0652 09/14/18 0852  BP: (!) 135/57  140/62 (!) 147/84  Pulse: 64  67 72  Resp: 20  20   Temp: 98.9 F (37.2 C)  98.1 F (36.7 C)   TempSrc: Oral     SpO2: 97%  96% 97%  Weight:  123.8 kg    Height:       Weight change: 0.454 kg  Intake/Output Summary (Last 24 hours) at 09/14/2018 1034 Last data filed at 09/14/2018 0913 Gross per 24 hour  Intake 964.44 ml  Output 1625 ml  Net -660.56 ml    Assessment/Plan: 1.  AKI/CKD stage 4- in setting of decompensated CHF due to severe aortic stenosis with diuresis and concomitant ACE inhibition.  Agree with holding ace and resuming diuresis given his worsening dyspnea off of diuretics- have started lasix 40 IV q 8- only even with this.  Difficult situation as he is a poor candidate for longterm dialysis given his multiple co-morbidities and poor functional status.  I am very nervous for him at the prospect of attempting to fix this valve and the cost of that for him, meaning worsening renal function- seems like plan now is for medical management and is also DNR.    2. Acute heart failure due to severe aortic stenosis- now thinking medical management. Needs more diuretics- will inc IV while here but agree with torsemide at discharge  3. HTN- stable off of lisinopril. Would not resume at all , BP should improve as volume status improves  4. Hyperlipidemia- on statin therapy 5. DM type 2- hold metformin due to CKD stage 4 and follow 6. Anemia of CKD-  iron stores low, giving feraheme  and started ESA- would not necessarily need as OP  7. SHPTH-  vit D level good and iPTH 31 8. Hypokalemia- start a little K supp- to continue 9. Dispo- if is discharged will need someone to follow labs for K  and renal function - possibly his PCP vs hospice ?   could manage as is not a candidate for dialysis- if renal function worsens- hospice will play a bigger role     Billy [name]    Labs: Basic Metabolic Panel: Recent Labs  Lab 09/12/18 0445 09/13/18 0508 09/14/18 0302  NA 141 143 142  K 3.5 3.6 3.9  CL 106 109 109  CO2 23 29 24   GLUCOSE 107* 125* 121*  BUN 75* 73* 76*  CREATININE 2.91* 2.85* 2.92*  CALCIUM 8.6* 8.5* 8.3*  PHOS  --  4.4  --    Liver Function Tests: Recent Labs  Lab 09/13/18 0508 09/14/18 0302  AST  --  19  ALT  --  15  ALKPHOS  --  49  BILITOT  --  0.7  PROT  --  5.7*  ALBUMIN 2.8* 2.8*   No results for input(s): LIPASE, AMYLASE in the last 168 hours. No results for input(s): AMMONIA in the last 168 hours. CBC: Recent Labs  Lab 09/11/18 2015 09/12/18 0445 09/12/18 1651 09/13/18 0508 09/14/18 0302  WBC 9.5 8.8  --  7.3 6.4  NEUTROABS  --  7.1  --   --   --   HGB  9.0* 8.0*  --  7.9* 7.9*  HCT 31.4* 26.5* 27.2* 27.1* 27.0*  MCV 88.7 86.6  --  87.1 86.5  PLT 187 166  --  145* 139*   Cardiac Enzymes: No results for input(s): CKTOTAL, CKMB, CKMBINDEX, TROPONINI in the last 168 hours. CBG: Recent Labs  Lab 09/13/18 0554 09/13/18 1201 09/13/18 1644 09/13/18 2126 09/14/18 0554  GLUCAP 117* 166* 111* 149* 106*    Iron Studies:  Recent Labs    09/12/18 1651  IRON 22*  TIBC 323  FERRITIN 65   Studies/Results: US Renal  Result Date: 09/12/2018 CLINICAL DATA:  Acute renal failure EXAM: RENAL / URINARY TRACT ULTRASOUND COMPLETE COMPARISON:  None. FINDINGS: Right Kidney: Renal measurements: 11.9 x 4.9 x 5.5 cm = volume: 168 mL. Inc negative for obstruction. 13 mm upper pole lesion most likely a cyst. Left Kidney: Renal measurements: 12.3 x 5.8 x 7.1 cm = volume: 269 mL. Hypoechoic mass in the renal hila measuring 5.4 x 4.5 cm. This is not well evaluated by ultrasound due to body habitus. This could represent a complex cyst or mass.  This was present in 2017 and appears similar in appearance. Bladder: Appears normal for degree of bladder distention. IMPRESSION: Negative for hydronephrosis.  Normal renal size. 5.4 x 4.5 hypoechoic mass left renal hilum not ideally evaluated by ultrasound. The lesion is stable since prior ultrasound of 08/31/2015. Electronically Signed   By: Franchot Gallo M.D.   On: 09/12/2018 18:25   Medications: Infusions: . sodium chloride    . ferumoxytol Stopped (09/13/18 1235)    Scheduled Medications: . amLODipine  10 mg Oral Daily  . aspirin EC  81 mg Oral Daily  . atorvastatin  20 mg Oral Daily  . bisoprolol  5 mg Oral Daily  . buPROPion  150 mg Oral Daily  . darbepoetin (ARANESP) injection - NON-DIALYSIS  100 mcg Subcutaneous Q Fri-1800  . doxazosin  4 mg Oral Daily  . escitalopram  10 mg Oral Daily  . ferrous sulfate  325 mg Oral BID WC  . furosemide  40 mg Intravenous Q8H  . heparin  5,000 Units Subcutaneous Q8H  . insulin aspart  0-15 Units Subcutaneous TID WC  . nystatin cream   Topical BID  . potassium chloride  20 mEq Oral BID  . sodium chloride flush  3 mL Intravenous Q12H    have reviewed scheduled and prn medications.  Physical Exam: General: obese, some SOB at rest  Heart: RRR Lungs: dec BS ar bases, shallow breaths Abdomen: obese Extremities: pitting edema-  Condom cath    09/14/2018,10:34 AM  LOS: 3 days

## 2018-09-14 NOTE — Progress Notes (Signed)
Progress Note  Patient Name: Billy [name] Date of Encounter: 09/14/2018  Primary Cardiologist: No primary care provider on file.   Subjective   No complaints this morning  Inpatient Medications    Scheduled Meds: . amLODipine  10 mg Oral Daily  . aspirin EC  81 mg Oral Daily  . atorvastatin  20 mg Oral Daily  . bisoprolol  5 mg Oral Daily  . buPROPion  150 mg Oral Daily  . darbepoetin (ARANESP) injection - NON-DIALYSIS  100 mcg Subcutaneous Q Fri-1800  . doxazosin  4 mg Oral Daily  . escitalopram  10 mg Oral Daily  . ferrous sulfate  325 mg Oral BID WC  . furosemide  80 mg Intravenous Q8H  . heparin  5,000 Units Subcutaneous Q8H  . insulin aspart  0-15 Units Subcutaneous TID WC  . nystatin cream   Topical BID  . potassium chloride  20 mEq Oral BID  . sodium chloride flush  3 mL Intravenous Q12H   Continuous Infusions: . sodium chloride    . ferumoxytol Stopped (09/13/18 1235)   PRN Meds: sodium chloride, acetaminophen, LORazepam, ondansetron (ZOFRAN) IV, senna-docusate, sodium chloride flush   Vital Signs    Vitals:   09/14/18 0430 09/14/18 0652 09/14/18 0852 09/14/18 1215  BP:  140/62 (!) 147/84 104/65  Pulse:  67 72 (!) 57  Resp:  20  19  Temp:  98.1 F (36.7 C)  97.9 F (36.6 C)  TempSrc:    Oral  SpO2:  96% 97% 96%  Weight: 123.8 kg     Height:        Intake/Output Summary (Last 24 hours) at 09/14/2018 1254 Last data filed at 09/14/2018 0913 Gross per 24 hour  Intake 724.44 ml  Output 1625 ml  Net -900.56 ml   Last 3 Weights 09/14/2018 09/13/2018 09/12/2018  Weight (lbs) 273 lb 272 lb 272 lb  Weight (kg) 123.832 kg 123.378 kg 123.378 kg      Telemetry    Sinus rhythm- Personally Reviewed  ECG    None new- Personally Reviewed  Physical Exam   GEN: Well nourished, well developed, in no acute distress  HEENT: normal  Neck: no JVD, carotid bruits, or masses Cardiac: RRR; no murmurs, rubs, or gallops,no edema  Respiratory:  clear to  auscultation bilaterally, normal work of breathing GI: soft, nontender, nondistended, + BS MS: no deformity or atrophy  Skin: warm and dry Neuro:  Strength and sensation are intact Psych: euthymic mood, full affect   Labs    Chemistry Recent Labs  Lab 09/12/18 0445 09/13/18 0508 09/14/18 0302  NA 141 143 142  K 3.5 3.6 3.9  CL 106 109 109  CO2 23 29 24   GLUCOSE 107* 125* 121*  BUN 75* 73* 76*  CREATININE 2.91* 2.85* 2.92*  CALCIUM 8.6* 8.5* 8.3*  PROT  --   --  5.7*  ALBUMIN  --  2.8* 2.8*  AST  --   --  19  ALT  --   --  15  ALKPHOS  --   --  49  BILITOT  --   --  0.7  GFRNONAA 19* 20* 19*  GFRAA 22* 23* 22*  ANIONGAP 12 5 9      Hematology Recent Labs  Lab 09/12/18 0445 09/12/18 1651 09/13/18 0508 09/14/18 0302  WBC 8.8  --  7.3 6.4  RBC 3.06*  --  3.11* 3.12*  HGB 8.0*  --  7.9* 7.9*  HCT 26.5* 27.2* 27.1* 27.0*  MCV 86.6  --  87.1 86.5  MCH 26.1  --  25.4* 25.3*  MCHC 30.2  --  29.2* 29.3*  RDW 17.9*  --  17.8* 17.5*  PLT 166  --  145* 139*    Cardiac EnzymesNo results for input(s): TROPONINI in the last 168 hours. No results for input(s): TROPIPOC in the last 168 hours.   BNPNo results for input(s): BNP, PROBNP in the last 168 hours.   DDimer No results for input(s): DDIMER in the last 168 hours.   Radiology    US Renal  Result Date: 09/12/2018 CLINICAL DATA:  Acute renal failure EXAM: RENAL / URINARY TRACT ULTRASOUND COMPLETE COMPARISON:  None. FINDINGS: Right Kidney: Renal measurements: 11.9 x 4.9 x 5.5 cm = volume: 168 mL. Inc negative for obstruction. 13 mm upper pole lesion most likely a cyst. Left Kidney: Renal measurements: 12.3 x 5.8 x 7.1 cm = volume: 269 mL. Hypoechoic mass in the renal hila measuring 5.4 x 4.5 cm. This is not well evaluated by ultrasound due to body habitus. This could represent a complex cyst or mass. This was present in 2017 and appears similar in appearance. Bladder: Appears normal for degree of bladder distention.  IMPRESSION: Negative for hydronephrosis.  Normal renal size. 5.4 x 4.5 hypoechoic mass left renal hilum not ideally evaluated by ultrasound. The lesion is stable since prior ultrasound of 08/31/2015. Electronically Signed   By: Franchot Gallo M.D.   On: 09/12/2018 18:25    Cardiac Studies   TTE: 09/12/2018  IMPRESSIONS    1. The left ventricle has normal systolic function with an ejection fraction of 60-65%. The cavity size was normal. There is moderately increased left ventricular wall thickness. indeterminate due to moderate MAC.  2. The aortic valve has an indeterminate number of cusps Moderate calcification of the aortic valve. Aortic valve regurgitation is trivial by color flow Doppler. severe stenosis of the aortic valve.  3. The right ventricle has normal systolic function. The cavity was mildly enlarged. There is no increase in right ventricular wall thickness.  4. Left atrial size was mildly dilated.  5. Right atrial size was mildly dilated.  6. The mitral valve is degenerative. There is moderate mitral annular calcification present. Mild mitral valve stenosis.  7. The inferior vena cava was dilated in size with <50% respiratory variability.  SUMMARY   LVEF 60-65%, moderate LVH, normal wall motion, calcified aortic valve with severe stenosis - mean gradient only 20 mmHg (suspect undersampled) -AVA around 1.0 cm2 (based on LVOT diameter of 2.2 cm), mild mitral stenosis (HR 68) with trivial MR, mild biatrial enlargment, no significant TR, dilated IVC that does not collapse  FINDINGS  Left Ventricle: The left ventricle has normal systolic function, with an ejection fraction of 60-65%. The cavity size was normal. There is moderately increased left ventricular wall thickness. indeterminate due to moderate MAC. Right Ventricle: The right ventricle has normal systolic function. The cavity was mildly enlarged. There is no increase in right ventricular wall thickness. Left Atrium: left  atrial size was mildly dilated Right Atrium: right atrial size was mildly dilated. Right atrial pressure is estimated at 15 mmHg. Interatrial Septum: No atrial level shunt detected by color flow Doppler. Pericardium: There is no evidence of pericardial effusion. Mitral Valve: The mitral valve is degenerative in appearance. There is moderate mitral annular calcification present. Mitral valve regurgitation is trivial by color flow Doppler. Mild mitral valve stenosis. Tricuspid Valve: The tricuspid valve is normal in structure. Tricuspid valve  regurgitation was not visualized by color flow Doppler. Aortic Valve: The aortic valve has an indeterminate number of cusps Moderate calcification of the aortic valve. Aortic valve regurgitation is trivial by color flow Doppler. There is severe stenosis of the aortic valve, with a calculated valve area of  0.99 cm. Pulmonic Valve: The pulmonic valve was not well visualized. Pulmonic valve regurgitation is not visualized by color flow Doppler. Venous: The inferior vena cava measures 2.60 cm, is dilated in size with less than 50% respiratory variability.   Patient Profile     84 y.o. male with PMH including HTN, HL, DMII, CKD IV, obesity, and murmur. Found to have severe AS and transferred to [facility] for further work up regarding TAVR.   Assessment & Plan    1. Acute heart failure in the setting of severe aortic stenosis:  Patient has been having 3 weeks of progressive dyspnea and subsequent lower extremity edema by his primary care physician.  Was found to have severe aortic stenosis in the setting of stage III CKD.  At this point unfortunately he is not a candidate for valve replacement.  He does not wish to be on long-term dialysis and thus he has opted for palliative care.  Unfortunately holding his diuretics has not improved his renal function.  Diuresis plan per nephrology.  Hopefully we [facility] be able to get him home tomorrow.    2. Essential hypertension:  Has remained stable.  3. Hyperlipidemia: Continue statin  4. Type 2 diabetes mellitus: Continue sliding scale.  5. Stage IV chronic kidney disease:  Diuresis per nephrology.  6. Anemia:  Currently on iron supplementation.  IMA globin has remained stable.  Disposition: Hopefully home tomorrow.  Plan for continued diuresis today.  Appreciate nephrology help with diuretic dosing.  For questions or updates, please contact [place] Please consult [email or web address] for contact info under   Signed, Davaris Youtsey Meredith Leeds, MD  09/14/2018, 12:54 PM

## 2018-09-14 NOTE — Progress Notes (Signed)
Occupational Therapy Treatment Patient Details Name: Billy [name] MRN: [id number] DOB: 16-May-1935 Today's Date: 09/14/2018    History of present illness 84 y.o. male with PMH including HTN, HL, DMII, CKD IV, obesity, and murmur.Found to have severe AS, Cardiac cath on hold due to elevated creatinine 2.85.    OT comments  Pt reports his family assists with heavy meal prep, housekeeping and setting up of his pill box. Pt with decreased endurance, but able to toilet, stand at sink for grooming and perform dressing with set up to min assist. Pt reports is family can provide 24 hour assistance as needed. He does not appear to be well aware of his deficits. Will continue to follow.  Follow Up Recommendations  SNF;Supervision/Assistance - 24 hour(HHOT and PT if pt goes home)    Equipment Recommendations  None recommended by OT    Recommendations for Other Services PT consult    Precautions / Restrictions Precautions Precautions: Fall Precaution Comments: watch O2 Restrictions Weight Bearing Restrictions: No       Mobility Bed Mobility Overal bed mobility: Modified Independent             General bed mobility comments: increased time, HOB up  Transfers Overall transfer level: Needs assistance Equipment used: Rolling walker (2 wheeled) Transfers: Sit to/from Stand Sit to Stand: Min guard         General transfer comment: increased time to rise, no physical assist    Balance Overall balance assessment: Needs assistance   Sitting balance-Leahy Scale: Good Sitting balance - Comments: no LOB with donning socks     Standing balance-Leahy Scale: Poor Standing balance comment: dependent on UE support, including at sink                           ADL either performed or assessed with clinical judgement   ADL Overall ADL's : Needs assistance/impaired     Grooming: Wash/dry hands;Standing;Min guard;Wash/dry face;Sitting;Set up           Upper Body  Dressing : Set up;Sitting   Lower Body Dressing: Set up;Sitting/lateral leans Lower Body Dressing Details (indicate cue type and reason): socks Toilet Transfer: Min guard;RW;BSC;Ambulation;Cueing for safety   Toileting- Clothing Manipulation and Hygiene: Minimal assistance;Sit to/from stand Toileting - Clothing Manipulation Details (indicate cue type and reason): assist for thoroughness     Functional mobility during ADLs: Min guard;Rolling walker General ADL Comments: instructed pt to use his rollator insteady of cane at home and in safety related to 02 tubing     Vision       Perception     Praxis      Cognition Arousal/Alertness: Awake/alert Behavior During Therapy: Flat affect Overall Cognitive Status: Impaired/Different from baseline Area of Impairment: Safety/judgement;Problem solving;Awareness;Memory                     Memory: Decreased short-term memory   Safety/Judgement: Decreased awareness of deficits Awareness: Emergent Problem Solving: Requires verbal cues          Exercises     Shoulder Instructions       General Comments      Pertinent Vitals/ Pain       Pain Assessment: No/denies pain  Home Living  Prior Functioning/Environment              Frequency  Min 3X/week        Progress Toward Goals  OT Goals(current goals can now be found in the care plan section)  Progress towards OT goals: Progressing toward goals  Acute Rehab OT Goals Patient Stated Goal: "I just want to go home" OT Goal Formulation: With patient Time For Goal Achievement: 09/27/18 Potential to Achieve Goals: Good  Plan Discharge plan remains appropriate    Co-evaluation                 AM-PAC OT "6 Clicks" Daily Activity     Outcome Measure   Help from another person eating meals?: None Help from another person taking care of personal grooming?: A Little Help from another person  toileting, which includes using toliet, bedpan, or urinal?: A Little Help from another person bathing (including washing, rinsing, drying)?: A Little Help from another person to put on and taking off regular upper body clothing?: None Help from another person to put on and taking off regular lower body clothing?: A Little 6 Click Score: 20    End of Session Equipment Utilized During Treatment: Gait belt;Rolling walker;Oxygen  OT Visit Diagnosis: Unsteadiness on feet (R26.81);Other abnormalities of gait and mobility (R26.89);Muscle weakness (generalized) (M62.81);Other symptoms and signs involving cognitive function   Activity Tolerance Patient tolerated treatment well   Patient Left in chair;with call bell/phone within reach;with chair alarm set   Nurse Communication Mobility status        Time: 2023-3435 OT Time Calculation (min): 23 min  Charges: OT General Charges $OT Visit: 1 Visit OT Treatments $Self Care/Home Management : 23-37 mins  Nestor Lewandowsky, OTR/L Acute Rehabilitation Services Pager: [phone] Office: [phone]   Malka So 09/14/2018, 9:50 AM

## 2018-09-15 DIAGNOSIS — I5033 Acute on chronic diastolic (congestive) heart failure: Secondary | ICD-10-CM

## 2018-09-15 LAB — CBC
HEMATOCRIT: 27.8 % — AB (ref 39.0–52.0)
Hemoglobin: 8.2 g/dL — ABNORMAL LOW (ref 13.0–17.0)
MCH: 25.7 pg — ABNORMAL LOW (ref 26.0–34.0)
MCHC: 29.5 g/dL — ABNORMAL LOW (ref 30.0–36.0)
MCV: 87.1 fL (ref 80.0–100.0)
Platelets: 143 10*3/uL — ABNORMAL LOW (ref 150–400)
RBC: 3.19 MIL/uL — ABNORMAL LOW (ref 4.22–5.81)
RDW: 17.3 % — ABNORMAL HIGH (ref 11.5–15.5)
WBC: 6.8 10*3/uL (ref 4.0–10.5)
nRBC: 0 % (ref 0.0–0.2)

## 2018-09-15 LAB — GLUCOSE, CAPILLARY
GLUCOSE-CAPILLARY: 129 mg/dL — AB (ref 70–99)
Glucose-Capillary: 98 mg/dL (ref 70–99)
Glucose-Capillary: 116 mg/dL — ABNORMAL HIGH (ref 70–99)
Glucose-Capillary: 105 mg/dL — ABNORMAL HIGH (ref 70–99)

## 2018-09-15 LAB — BASIC METABOLIC PANEL
Anion gap: 9 (ref 5–15)
BUN: 73 mg/dL — ABNORMAL HIGH (ref 8–23)
CHLORIDE: 106 mmol/L (ref 98–111)
CO2: 26 mmol/L (ref 22–32)
Calcium: 8.6 mg/dL — ABNORMAL LOW (ref 8.9–10.3)
Creatinine, Ser: 2.72 mg/dL — ABNORMAL HIGH (ref 0.61–1.24)
GFR calc Af Amer: 24 mL/min — ABNORMAL LOW (ref >60)
GFR calc non Af Amer: 21 mL/min — ABNORMAL LOW (ref >60)
Glucose, Bld: 104 mg/dL — ABNORMAL HIGH (ref 70–99)
Potassium: 4.1 mmol/L (ref 3.5–5.1)
Sodium: 141 mmol/L (ref 135–145)

## 2018-09-15 MED ORDER — AMLODIPINE BESYLATE 2.5 MG PO TABS
5.0000 mg | ORAL_TABLET | Freq: Every day | ORAL | Status: DC
  Administered 2018-09-15 – 2018-09-17 (×3): 5 mg via ORAL
  Filled 2018-09-15 (×3): qty 2

## 2018-09-15 MED ORDER — FUROSEMIDE 80 MG PO TABS
160.0000 mg | ORAL_TABLET | Freq: Two times a day (BID) | ORAL | Status: DC
  Administered 2018-09-15 – 2018-09-17 (×5): 160 mg via ORAL
  Filled 2018-09-15 (×6): qty 2

## 2018-09-15 NOTE — Progress Notes (Signed)
Subjective:  3200 UOP ! - crt stable to slightly better but still poor.  He is still SOB "this is all greek to me"  But is appreciative that we are all trying the best for him.  Palliative care has been very helpful     Objective Vital signs in last 24 hours: Vitals:   09/14/18 0852 09/14/18 1215 09/14/18 1927 09/15/18 0527  BP: (!) 147/84 104/65 (!) 125/57 (!) 141/48  Pulse: 72 (!) 57 66 68  Resp:  19 18 18   Temp:  97.9 F (36.6 C) 98.3 F (36.8 C) 98 F (36.7 C)  TempSrc:  Oral Oral Oral  SpO2: 97% 96% 99% 97%  Weight:    121.7 kg  Height:       Weight change: -2.177 kg  Intake/Output Summary (Last 24 hours) at 09/15/2018 0840 Last data filed at 09/15/2018 0100 Gross per 24 hour  Intake 840 ml  Output 3200 ml  Net -2360 ml    Assessment/Plan: 1.  AKI/CKD stage 4- in setting of decompensated CHF due to severe aortic stenosis with diuresis and concomitant ACE inhibition.  Agree with holding ace and resuming diuresis - needing 80 TID IV to achieve diuresis.  Difficult situation as he is a poor candidate for longterm dialysis given his multiple co-morbidities and poor functional status.  I am very nervous for him at the prospect of attempting to fix this valve and the cost of that for him, meaning worsening renal function- seems like plan now is for medical management and is also DNR.    2. Acute heart failure due to severe aortic stenosis- now thinking medical management. Will convert lasix to PO- thinking will need 160 BID for now 3. HTN- stable off of lisinopril. Would not resume at all , BP should improve as volume status improves.  Be careful to not lower BP too much, feel it will improve as volume status improves, will dec norvasc to 5 daily and follow- have low threshold to stop norvasc and cardura     4. DM type 2- hold metformin due to CKD stage 4 and follow 5. Anemia of CKD-  iron stores low, giving feraheme  and started ESA- would not necessarily need as OP  6. SHPTH-  vit D  level good and iPTH 31 7. Hypokalemia- start a little K supp- 20 BID - to continue 8. Dispo- if is discharged will need someone to follow weights and labs for K and renal function - possibly his PCP vs hospice  could manage as is not a candidate for dialysis    Billy [name]    Labs: Basic Metabolic Panel: Recent Labs  Lab 09/13/18 0508 09/14/18 0302 09/15/18 0444  NA 143 142 141  K 3.6 3.9 4.1  CL 109 109 106  CO2 29 24 26   GLUCOSE 125* 121* 104*  BUN 73* 76* 73*  CREATININE 2.85* 2.92* 2.72*  CALCIUM 8.5* 8.3* 8.6*  PHOS 4.4  --   --    Liver Function Tests: Recent Labs  Lab 09/13/18 0508 09/14/18 0302  AST  --  19  ALT  --  15  ALKPHOS  --  49  BILITOT  --  0.7  PROT  --  5.7*  ALBUMIN 2.8* 2.8*   No results for input(s): LIPASE, AMYLASE in the last 168 hours. No results for input(s): AMMONIA in the last 168 hours. CBC: Recent Labs  Lab 09/11/18 2015 09/12/18 0445  09/13/18 9767 09/14/18 0302 09/15/18 0444  WBC 9.5 8.8  --  7.3 6.4 6.8  NEUTROABS  --  7.1  --   --   --   --   HGB 9.0* 8.0*  --  7.9* 7.9* 8.2*  HCT 31.4* 26.5*   < > 27.1* 27.0* 27.8*  MCV 88.7 86.6  --  87.1 86.5 87.1  PLT 187 166  --  145* 139* 143*   < > = values in this interval not displayed.   Cardiac Enzymes: No results for input(s): CKTOTAL, CKMB, CKMBINDEX, TROPONINI in the last 168 hours. CBG: Recent Labs  Lab 09/14/18 0554 09/14/18 1123 09/14/18 1628 09/14/18 2105 09/15/18 0640  GLUCAP 106* 134* 108* 143* 98    Iron Studies:  Recent Labs    09/12/18 1651  IRON 22*  TIBC 323  FERRITIN 65   Studies/Results: No results found. Medications: Infusions: . sodium chloride    . ferumoxytol Stopped (09/13/18 1235)    Scheduled Medications: . amLODipine  10 mg Oral Daily  . aspirin EC  81 mg Oral Daily  . atorvastatin  20 mg Oral Daily  . bisoprolol  5 mg Oral Daily  . buPROPion  150 mg Oral Daily  . darbepoetin (ARANESP) injection - NON-DIALYSIS  100  mcg Subcutaneous Q Fri-1800  . doxazosin  4 mg Oral Daily  . escitalopram  10 mg Oral Daily  . ferrous sulfate  325 mg Oral BID WC  . furosemide  80 mg Intravenous Q8H  . heparin  5,000 Units Subcutaneous Q8H  . insulin aspart  0-15 Units Subcutaneous TID WC  . nystatin cream   Topical BID  . potassium chloride  20 mEq Oral BID  . sodium chloride flush  3 mL Intravenous Q12H    have reviewed scheduled and prn medications.  Physical Exam: General: obese,  SOB at rest seems better  Heart: RRR Lungs: dec BS ar bases, shallow breaths Abdomen: obese Extremities: pitting edema-  Condom cath    09/15/2018,8:40 AM  LOS: 4 days

## 2018-09-15 NOTE — Progress Notes (Signed)
Progress Note  Patient Name: Billy [name] Date of Encounter: 09/15/2018  Primary Cardiologist: No primary care provider on file.   Subjective   No complaints this morning.  Feeling well laying in bed.  Inpatient Medications    Scheduled Meds: . amLODipine  5 mg Oral Daily  . aspirin EC  81 mg Oral Daily  . atorvastatin  20 mg Oral Daily  . bisoprolol  5 mg Oral Daily  . buPROPion  150 mg Oral Daily  . darbepoetin (ARANESP) injection - NON-DIALYSIS  100 mcg Subcutaneous Q Fri-1800  . doxazosin  4 mg Oral Daily  . escitalopram  10 mg Oral Daily  . ferrous sulfate  325 mg Oral BID WC  . furosemide  160 mg Oral BID  . heparin  5,000 Units Subcutaneous Q8H  . insulin aspart  0-15 Units Subcutaneous TID WC  . nystatin cream   Topical BID  . potassium chloride  20 mEq Oral BID  . sodium chloride flush  3 mL Intravenous Q12H   Continuous Infusions: . sodium chloride    . ferumoxytol Stopped (09/13/18 1235)   PRN Meds: sodium chloride, acetaminophen, LORazepam, ondansetron (ZOFRAN) IV, senna-docusate, sodium chloride flush   Vital Signs    Vitals:   09/14/18 1215 09/14/18 1927 09/15/18 0527 09/15/18 0914  BP: 104/65 (!) 125/57 (!) 141/48 (!) 131/41  Pulse: (!) 57 66 68 66  Resp: _0 Temp: 97.9 F (36.6 C) 98.3 F (36.8 C) 98 F (36.7 C)   TempSrc: Oral Oral Oral   SpO2: 96% 99% 97% 99%  Weight:   121.7 kg   Height:        Intake/Output Summary (Last 24 hours) at 09/15/2018 0958 Last data filed at 09/15/2018 0912 Gross per 24 hour  Intake 840 ml  Output 3400 ml  Net -2560 ml   Last 3 Weights 09/15/2018 09/14/2018 09/13/2018  Weight (lbs) 268 lb 3.2 oz 273 lb 272 lb  Weight (kg) 121.655 kg 123.832 kg 123.378 kg      Telemetry    Sinus rhythm- Personally Reviewed  ECG    None new- Personally Reviewed  Physical Exam   GEN: Well nourished, well developed, in no acute distress  HEENT: normal  Neck: no JVD, carotid bruits, or masses Cardiac: RRR; 3  out of 6 systolic murmur at the apex, no rubs, or gallops,no edema  Respiratory:  clear to auscultation bilaterally, normal work of breathing GI: soft, nontender, nondistended, + BS MS: no deformity or atrophy  Skin: warm and dry Neuro:  Strength and sensation are intact Psych: euthymic mood, full affect    Labs    Chemistry Recent Labs  Lab 09/13/18 0508 09/14/18 0302 09/15/18 0444  NA 143 142 141  K 3.6 3.9 4.1  CL 109 109 106  CO2 _1 GLUCOSE 125* 121* 104*  BUN 73* 76* 73*  CREATININE 2.85* 2.92* 2.72*  CALCIUM 8.5* 8.3* 8.6*  PROT  --  5.7*  --   ALBUMIN 2.8* 2.8*  --   AST  --  19  --   ALT  --  15  --   ALKPHOS  --  49  --   BILITOT  --  0.7  --   GFRNONAA 20* 19* 21*  GFRAA 23* 22* 24*  ANIONGAP _2 Hematology Recent Labs  Lab 09/13/18 0508 09/14/18 0302 09/15/18 0444  WBC 7.3 6.4 6.8  RBC 3.11* 3.12* 3.19*  HGB 7.9* 7.9* 8.2*  HCT 27.1* 27.0* 27.8*  MCV 87.1 86.5 87.1  MCH 25.4* 25.3* 25.7*  MCHC 29.2* 29.3* 29.5*  RDW 17.8* 17.5* 17.3*  PLT 145* 139* 143*    Cardiac EnzymesNo results for input(s): TROPONINI in the last 168 hours. No results for input(s): TROPIPOC in the last 168 hours.   BNPNo results for input(s): BNP, PROBNP in the last 168 hours.   DDimer No results for input(s): DDIMER in the last 168 hours.   Radiology    No results found.  Cardiac Studies   TTE: 09/12/2018  IMPRESSIONS    1. The left ventricle has normal systolic function with an ejection fraction of 60-65%. The cavity size was normal. There is moderately increased left ventricular wall thickness. indeterminate due to moderate MAC.  2. The aortic valve has an indeterminate number of cusps Moderate calcification of the aortic valve. Aortic valve regurgitation is trivial by color flow Doppler. severe stenosis of the aortic valve.  3. The right ventricle has normal systolic function. The cavity was mildly enlarged. There is no increase in right  ventricular wall thickness.  4. Left atrial size was mildly dilated.  5. Right atrial size was mildly dilated.  6. The mitral valve is degenerative. There is moderate mitral annular calcification present. Mild mitral valve stenosis.  7. The inferior vena cava was dilated in size with <50% respiratory variability.  SUMMARY   LVEF 60-65%, moderate LVH, normal wall motion, calcified aortic valve with severe stenosis - mean gradient only 20 mmHg (suspect undersampled) -AVA around 1.0 cm2 (based on LVOT diameter of 2.2 cm), mild mitral stenosis (HR 68) with trivial MR, mild biatrial enlargment, no significant TR, dilated IVC that does not collapse  FINDINGS  Left Ventricle: The left ventricle has normal systolic function, with an ejection fraction of 60-65%. The cavity size was normal. There is moderately increased left ventricular wall thickness. indeterminate due to moderate MAC. Right Ventricle: The right ventricle has normal systolic function. The cavity was mildly enlarged. There is no increase in right ventricular wall thickness. Left Atrium: left atrial size was mildly dilated Right Atrium: right atrial size was mildly dilated. Right atrial pressure is estimated at 15 mmHg. Interatrial Septum: No atrial level shunt detected by color flow Doppler. Pericardium: There is no evidence of pericardial effusion. Mitral Valve: The mitral valve is degenerative in appearance. There is moderate mitral annular calcification present. Mitral valve regurgitation is trivial by color flow Doppler. Mild mitral valve stenosis. Tricuspid Valve: The tricuspid valve is normal in structure. Tricuspid valve regurgitation was not visualized by color flow Doppler. Aortic Valve: The aortic valve has an indeterminate number of cusps Moderate calcification of the aortic valve. Aortic valve regurgitation is trivial by color flow Doppler. There is severe stenosis of the aortic valve, with a calculated valve area of  0.99  cm. Pulmonic Valve: The pulmonic valve was not well visualized. Pulmonic valve regurgitation is not visualized by color flow Doppler. Venous: The inferior vena cava measures 2.60 cm, is dilated in size with less than 50% respiratory variability.   Patient Profile     84 y.o. male with PMH including HTN, HL, DMII, CKD IV, obesity, and murmur. Found to have severe AS and transferred to [facility] for further work up regarding TAVR.   Assessment & Plan    1. Acute diastolic heart failure in the setting of severe aortic stenosis:  Patient had a long discussion with Dr. Burt Knack about the option of valve replacement  with TAVR.  Due to his kidney disease, it was felt that this was not a viable option.  Palliative care has been consulted.  The patient has plans to go home without any further work-up of his valvular heart disease.  Nephrology has been adjusting his Lasix dose and is put her on 160 twice a day.  Should he diurese well with this, we Margie Urbanowicz plan for discharge likely tomorrow.    2. Essential hypertension: Well-controlled  3. Hyperlipidemia: Continue statin  4. Type 2 diabetes mellitus: Continue sliding scale  5. Stage IV chronic kidney disease:  Diuresis per nephrology  6. Anemia:  Continue iron supplementation  Disposition: Hopefully home tomorrow after diuresis with p.o. Lasix.  For questions or updates, please contact [place] Please consult [email or web address] for contact info under   Signed, Fredrika Canby Meredith Leeds, MD  09/15/2018, 9:58 AM

## 2018-09-16 DIAGNOSIS — I10 Essential (primary) hypertension: Secondary | ICD-10-CM

## 2018-09-16 DIAGNOSIS — N184 Chronic kidney disease, stage 4 (severe): Secondary | ICD-10-CM | POA: Diagnosis present

## 2018-09-16 LAB — BASIC METABOLIC PANEL
Anion gap: 10 (ref 5–15)
BUN: 75 mg/dL — ABNORMAL HIGH (ref 8–23)
CALCIUM: 8.8 mg/dL — AB (ref 8.9–10.3)
CO2: 27 mmol/L (ref 22–32)
Chloride: 105 mmol/L (ref 98–111)
Creatinine, Ser: 2.77 mg/dL — ABNORMAL HIGH (ref 0.61–1.24)
GFR calc Af Amer: 24 mL/min — ABNORMAL LOW (ref >60)
GFR calc non Af Amer: 21 mL/min — ABNORMAL LOW (ref >60)
Glucose, Bld: 103 mg/dL — ABNORMAL HIGH (ref 70–99)
Potassium: 4.1 mmol/L (ref 3.5–5.1)
Sodium: 142 mmol/L (ref 135–145)

## 2018-09-16 LAB — GLUCOSE, CAPILLARY
Glucose-Capillary: 93 mg/dL (ref 70–99)
Glucose-Capillary: 102 mg/dL — ABNORMAL HIGH (ref 70–99)
Glucose-Capillary: 111 mg/dL — ABNORMAL HIGH (ref 70–99)
Glucose-Capillary: 144 mg/dL — ABNORMAL HIGH (ref 70–99)

## 2018-09-16 NOTE — TOC Progression Note (Addendum)
Transition of Care [facility]) - Progression Note    Patient Details  Name: Billy [name] MRN: [id number] Date of Birth: [birth year]  Transition of Care [facility]) CM/SW Contact  Bartholomew Crews, RN Phone Number: 09/16/2018, 12:31 PM  Clinical Narrative:    Spoke with patient at bedside to offer choice for [facility] agency and DME for oxygen. Patient states that his daughter, Almyra Free, handles all his needs. Patient gave permission for CM to call his daughters, [name] and Almyra Free. Unable to reach either by telephone - both did not have vm set up. Left choice lists at bedside with patient with CM contact number on [facility] agency list. Patient states that he would like to transition home today if possible. CM to follow for transition of care needs.         Expected Discharge Plan and [place] with [facility] - DME for oxygen - and palliative referral                               Social Determinants of Health (SDOH) Interventions    Readmission Risk Interventions No flowsheet data found.

## 2018-09-16 NOTE — Progress Notes (Signed)
   Patient's O2 saturations noted to be under 90% without supplemental O2 this admission. O2 saturations on room air dropped to 88% while ambulating on 09/13/2018 and improved to 96% on 2L of O2. This was repeated today and showed: -  Patient Saturations on Room Air at Rest = 90-91% - Patient Saturations on Room Air while Ambulating = 87-88%  - Patient Saturations on 1Liters of oxygen while Ambulating 93%.  Therefore, patient will need home O2. Orders have been placed and reached out to Case Management to help facilitate prior to discharge.  Darreld Mclean, PA-C 09/16/2018 3:43 PM

## 2018-09-16 NOTE — Progress Notes (Signed)
Occupational Therapy Treatment Patient Details Name: Billy [name] MRN: [id number] DOB: June 11, 1935 Today's Date: 09/16/2018    History of present illness 84 y.o. male with PMH including HTN, HL, DMII, CKD IV, obesity, and murmur.Found to have severe AS, Cardiac cath on hold due to elevated creatinine 2.85.    OT comments  Pt eager to go home. Sp02 down to 87% on RA with ambulation. Returned 1L 02 with rebound to 95% in sitting. Pt with decreased activity tolerance, fatigues easily in standing. Family planning to take pt home and provide assistance. Will continue to follow.  Follow Up Recommendations  SNF;Supervision/Assistance - 24 hour(HHOT and PT if family chooses to take him home)    Equipment Recommendations  None recommended by OT    Recommendations for Other Services PT consult    Precautions / Restrictions Precautions Precautions: Fall Precaution Comments: watch O2       Mobility Bed Mobility Overal bed mobility: Modified Independent             General bed mobility comments: increased time, HOB flat  Transfers Overall transfer level: Needs assistance Equipment used: Rolling walker (2 wheeled) Transfers: Sit to/from Stand Sit to Stand: Min guard         General transfer comment: increased time to rise, no physical assist    Balance Overall balance assessment: Needs assistance   Sitting balance-Leahy Scale: Fair Sitting balance - Comments: posterior lean, props on one hand   Standing balance support: Bilateral upper extremity supported;During functional activity Standing balance-Leahy Scale: Poor                             ADL either performed or assessed with clinical judgement   ADL Overall ADL's : Needs assistance/impaired     Grooming: Wash/dry hands;Wash/dry face;Cueing for UE precautions;Set up           Upper Body Dressing : Minimal assistance;Sitting       Toilet Transfer: Min guard;Ambulation;RW   Toileting-  Clothing Manipulation and Hygiene: Maximal assistance;Sit to/from stand Toileting - Clothing Manipulation Details (indicate cue type and reason): after BM     Functional mobility during ADLs: Passenger transport manager     Praxis      Cognition Arousal/Alertness: Awake/alert Behavior During Therapy: Flat affect Overall Cognitive Status: Impaired/Different from baseline Area of Impairment: Safety/judgement;Problem solving;Awareness;Memory                     Memory: Decreased short-term memory   Safety/Judgement: Decreased awareness of deficits Awareness: Emergent Problem Solving: Requires verbal cues          Exercises     Shoulder Instructions       General Comments      Pertinent Vitals/ Pain       Pain Assessment: No/denies pain  Home Living                                          Prior Functioning/Environment              Frequency  Min 3X/week        Progress Toward Goals  OT Goals(current goals can now be found in the care plan section)  Progress towards OT goals: Progressing toward goals  Acute Rehab  OT Goals Patient Stated Goal: "I just want to go home" OT Goal Formulation: With patient Time For Goal Achievement: 09/27/18 Potential to Achieve Goals: Good  Plan Discharge plan remains appropriate    Co-evaluation                 AM-PAC OT "6 Clicks" Daily Activity     Outcome Measure   Help from another person eating meals?: None Help from another person taking care of personal grooming?: A Little Help from another person toileting, which includes using toliet, bedpan, or urinal?: A Lot Help from another person bathing (including washing, rinsing, drying)?: A Lot Help from another person to put on and taking off regular upper body clothing?: A Little Help from another person to put on and taking off regular lower body clothing?: A Lot 6 Click Score: 16    End of  Session    OT Visit Diagnosis: Unsteadiness on feet (R26.81);Other abnormalities of gait and mobility (R26.89);Muscle weakness (generalized) (M62.81);Other symptoms and signs involving cognitive function   Activity Tolerance Patient tolerated treatment well   Patient Left in chair;with call bell/phone within reach;with chair alarm set   Nurse Communication Other (comment)(assessed Sp02 on and off 02 with RN)        Time: 8889-1694 OT Time Calculation (min): 25 min  Charges: OT General Charges $OT Visit: 1 Visit OT Treatments $Self Care/Home Management : 23-37 mins  Billy [name], Billy [name] Acute Rehabilitation Services Pager: [phone] Office: [phone]   Billy [name] 09/16/2018, 9:17 AM

## 2018-09-16 NOTE — Progress Notes (Signed)
   Spoke with patient's daughter [name]. Currently, family is having a difficult time figuring out where the patient can go when discharged. Patient lives in a mobile home. His mobile home was going to be moved today to daughter's property but this was postponed due to the rain. His mobile home is now not able to be moved until Wednesday. Daughter said patient is not able to stay with her or her siblings and they were trying to find a motel for him. I expressed some concern over having patient stay in a motel with COVID-19 and daughter understood. Home health and home O2 have all been arranged, but the plan is to keep the patient overnight to give daughters more time to make arrangements on where patient can stay when discharged.  Darreld Mclean, PA-C 09/16/2018 4:40 PM

## 2018-09-16 NOTE — Discharge Summary (Signed)
Discharge Summary    Patient ID: Billy [name] MRN: [id number]; DOB: 05/25/1935  Admit date: 09/11/2018 Discharge date: 09/17/2018  Primary Care Provider: Patient, No Pcp Per  Primary Cardiologist: New to [facility]. Patient will follow-up in [place]. Primary Electrophysiologist:  None   Discharge Diagnoses    Principal Problem:   Acute on chronic diastolic heart failure (HCC) Active Problems:   Type 2 diabetes mellitus (HCC)   Hyperlipidemia   Severe aortic stenosis   Hypertension   Chronic kidney disease (CKD), stage IV (severe) (HCC)   Allergies No Known Allergies  Diagnostic Studies/Procedures    Echocardiogram 09/12/2018: 1. The left ventricle has normal systolic function with an ejection fraction of 60-65%. The cavity size was normal. There is moderately increased left ventricular wall thickness. indeterminate due to moderate MAC. 2. The aortic valve has an indeterminate number of cusps Moderate calcification of the aortic valve. Aortic valve regurgitation is trivial by color flow Doppler. severe stenosis of the aortic valve. 3. The right ventricle has normal systolic function. The cavity was mildly enlarged. There is no increase in right ventricular wall thickness. 4. Left atrial size was mildly dilated. 5. Right atrial size was mildly dilated. 6. The mitral valve is degenerative. There is moderate mitral annular calcification present. Mild mitral valve stenosis. 7. The inferior vena cava was dilated in size with <50% respiratory variability.  Summary: LVEF 60-65%, moderate LVH, normal wall motion, calcified aortic valve with severe stenosis - mean gradient only 20 mmHg (suspect undersampled) -AVA around 1.0 cm2 (based on LVOT diameter of 2.2 cm), mild mitral stenosis (HR 68) with trivial MR, mild biatrial enlargment, no significant TR, dilated IVC that does not collapse.   History of Present Illness     Mr. Billy [name] is a 84 year old male with a history of history of  hypertension, hyperlipidemia, diabetes mellitus, obesity, heart murmur, and CKD stage IV. Patient states he was evaluated at the [facility] many years ago releated to his murmur but never required any additional cardiology follow-up. He lives by himself in [place] with family nearby. He does not routinely exercise but still drives.  He has no prior history of chest pain and does not think he's ever had a stress test.  He was in his usual state of health until approximately 3 weeks ago when he began to note progressive dyspnea on exertion without associated chest pain, edema, change in abd girth, orthopnea, or palpitations.  He had labs drawn on 09/02/2018 and was found to have a creatinine of 3.11 (baseline reportedly 2.5-2.6). Follow-up labs on 09/04/2018 returned with a creatinine of 3.42.  He was apparently seen by his PCP on 09/06/2018 and his Lasix dose was reduced.  He was seen back in clinic on 09/08/2018 and was noted to be more dyspneic with increasing lower extremity edema. In the setting of complex cardiorenal syndrome, he was referred to cardiology. Nephrology consult was also placed.  Unfortunately, symptoms worsened and his daughter decided to bring him to the emergency department at [facility] on 09/09/2018. There, he was noted to be volume overloaded and hypoxic with a saturation of 87%.  He was hypertensive and creatinine remained elevated at 3.2.  Pro-BNP was elevated at 17,000.  Chest x-ray showed low lung volumes with hazy lung densities and prominent central vascular markings.  He was given IV Lasix in the ED and subsequently admitted. An echocardiogram peformed on 09/09/2018 showed normal LV function with severe aortic stenosis. Therefore, patient was transferred to [facility]  for further consideration for TAVR.  Hospital Course     Consultants: Nephrology and Palliative Care.  Acute Diastolic CHF in the Setting of Severe Aortic Stenosis Mr. Juhnke was admitted for acute diastolic  heart failure in the setting of severe aortic stenosis as stated above. Echocardiogram showed LVEF of 60-65% with moderate LVH and severe aortic stenosis (valve area of 0.99cm^2). Dr. Burt Knack had a long discussion with patient about the prospects of causing contrast nephropathy if we were to proceed with cardiac catheterization and CTA studies that would be required as part of a pre-TAVR work-up. Patient expressed that he does not want to be on dialysis and would favor a palliative approach. Palliative Care was consulted to help establish goals of care. Decision was made to treat medically. Nephrology was consulted for assistance in diuretic management.  - Continue PO Lasix 163m twice daily per Nephrology. Nephrology also started K-Dur 288m twice daily which should be continued as long as he is on Lasix. Will need to follow BMET to ensure stability. Will have BMET rechecked on 09/20/2018 by home health RN.  CKD Stage IV Nephrology was consulted for acute on chronic kidney disease stage IV and for assistance with diuretic management. Patient was felt to be a poor candidate for long-term dialysis given his multiple co-morbidities and poor function status. Patient will need to follow-up with Nephrology as an outpatient. He does not currently have an outpatient Nephrologist but was referred by PCP recently prior to admission. Per Nephrology note, can follow-up with [facility] in 2 to 3 months. - Serum creatinine stable at 2.72 today. - Will have BMET rechecked on 09/20/2018 by home health RN.  Hypertension Patient's BP has been stable off home Lisinopril.  - Continue Amlodipine 62m93maily, Bisoprolol 62mg34mily, and Cardura 4mg 77mly. - Per Nephrology, low threshold to stop Amlodipine and Cardura.  Hyperlipidemia Lipid panel this admission: Cholesterol 91, Triglycerides 91, HDL 27, LDL 46.  - Continue Lipitor 20mg 90my.  Type 2 Diabetes Mellitus Hemoglobin A1c 6.1% this admission. Nephrology recommended holding  home Metformin due to renal function and CBGs have remained mostly well controlled. Advised patient to monitor blood glucose at home and contact PCP if it is consistently high. Will defer management to PCP and outpatient Nephrology.  Anemia Hemoglobin has ranged from 7.9 to 9.0 this admission. Felt to be secondary to CKD. Iron stores were low. Patient was given Feraheme and Aranesp. Per Nephrology, these may not be necessary as outpatient. Will continue Ferrous Sulfate at discharge. Will defer management to PCP and outpatient Nephrology.  Hypoxia Patient's O2 saturations noted to be under 90% without supplemental O2 this admission. Patient saturations at rest today on room air = 90-91%. Saturations on room air while ambulating 87-88% but improved to 93% when placed on 1 L of O2 via nasal cannula. Will discharge patient with home O2.  Candida Infection On arrival patient was noted to have candida infection of his groin area and Nystatin was started. Will continue Nystatin at discharge.    Per Nephrology note, will need to follow weights and labs for potassium and renal function. Discussed with Case Management and this can be done by home health RN.   Occupational therapy recommended SNF/24 hour supervision/assistance at discharge. Family planning to take patient home and provide assistance.   Patient seen and examined by Dr. CrenshStanford Breed and determined to be stable for discharge. Outpatient follow-up has been arranged for 10/04/2018 in our [facility]. Outpatient palliative care as well as home  health RN, PT/OT, nursing aide, and social work has been arranged by Case Management. Medications as below.   _____________  Discharge Vitals Blood pressure (!) 122/45, pulse 64, temperature 98.6 F (37 C), temperature source Oral, resp. rate 19, height 6' (1.829 m), weight 118.4 kg, SpO2 96 %.  Filed Weights   09/15/18 0527 09/16/18 0228 09/17/18 0149  Weight: 121.7 kg 119.8 kg 118.4 kg     Labs & Radiologic Studies    CBC Recent Labs    09/15/18 0444 09/17/18 0936  WBC 6.8 6.2  HGB 8.2* 8.1*  HCT 27.8* 26.5*  MCV 87.1 88.0  PLT 143* 161*   Basic Metabolic Panel Recent Labs    09/16/18 0429 09/17/18 0936  NA 142 138  K 4.1 4.2  CL 105 108  CO2 27 25  GLUCOSE 103* 138*  BUN 75* 77*  CREATININE 2.77* 2.72*  CALCIUM 8.8* 8.3*   Liver Function Tests No results for input(s): AST, ALT, ALKPHOS, BILITOT, PROT, ALBUMIN in the last 72 hours. No results for input(s): LIPASE, AMYLASE in the last 72 hours. Cardiac Enzymes No results for input(s): CKTOTAL, CKMB, CKMBINDEX, TROPONINI in the last 72 hours. BNP Invalid input(s): POCBNP D-Dimer No results for input(s): DDIMER in the last 72 hours. Hemoglobin A1C No results for input(s): HGBA1C in the last 72 hours. Fasting Lipid Panel No results for input(s): CHOL, HDL, LDLCALC, TRIG, CHOLHDL, LDLDIRECT in the last 72 hours. Thyroid Function Tests No results for input(s): TSH, T4TOTAL, T3FREE, THYROIDAB in the last 72 hours.  Invalid input(s): FREET3 _____________  US Renal  Result Date: 09/12/2018 CLINICAL DATA:  Acute renal failure EXAM: RENAL / URINARY TRACT ULTRASOUND COMPLETE COMPARISON:  None. FINDINGS: Right Kidney: Renal measurements: 11.9 x 4.9 x 5.5 cm = volume: 168 mL. Inc negative for obstruction. 13 mm upper pole lesion most likely a cyst. Left Kidney: Renal measurements: 12.3 x 5.8 x 7.1 cm = volume: 269 mL. Hypoechoic mass in the renal hila measuring 5.4 x 4.5 cm. This is not well evaluated by ultrasound due to body habitus. This could represent a complex cyst or mass. This was present in 2017 and appears similar in appearance. Bladder: Appears normal for degree of bladder distention. IMPRESSION: Negative for hydronephrosis.  Normal renal size. 5.4 x 4.5 hypoechoic mass left renal hilum not ideally evaluated by ultrasound. The lesion is stable since prior ultrasound of 08/31/2015. Electronically Signed    By: Franchot Gallo M.D.   On: 09/12/2018 18:25   Disposition   Patient is being discharged home today in good condition.  Follow-up Plans & Appointments    Follow-up Information    Primary Care Physician. Call.   Why:  Please call your primary care physician's office and schedule a follow-up within the next 1-2 weeks.       Park Liter, MD Follow up.   Specialty:  Cardiology Why:  You have a hospital follow-up visit scheduled for 10/04/2018 at 10:40am. Please arrive 15 minutes early for check-in. Contact information: [place] [address] [phone]          Discharge Instructions    Diet - low sodium heart healthy   Complete by:  As directed    Increase activity slowly   Complete by:  As directed       Discharge Medications   Allergies as of 09/17/2018   No Known Allergies     Medication List    STOP taking these medications   bisoprolol-hydrochlorothiazide 5-6.25 MG  tablet Commonly known as:  ZIAC   hydrALAZINE 50 MG tablet Commonly known as:  APRESOLINE   lisinopril 10 MG tablet Commonly known as:  PRINIVIL,ZESTRIL   LORazepam 1 MG tablet Commonly known as:  ATIVAN   metFORMIN 500 MG tablet Commonly known as:  GLUCOPHAGE   tamsulosin 0.4 MG Caps capsule Commonly known as:  FLOMAX     TAKE these medications   amLODipine 5 MG tablet Commonly known as:  NORVASC Take 1 tablet (5 mg total) by mouth daily. What changed:    medication strength  how much to take   aspirin EC 81 MG tablet Take 81 mg by mouth daily.   atorvastatin 20 MG tablet Commonly known as:  LIPITOR Take 1 tablet (20 mg total) by mouth daily.   bisoprolol 5 MG tablet Commonly known as:  ZEBETA Take 1 tablet (5 mg total) by mouth daily. What changed:    medication strength  how much to take   buPROPion 150 MG 24 hr tablet Commonly known as:  WELLBUTRIN XL Take 1 tablet (150 mg total) by mouth daily. What changed:  Another medication with the  same name was removed. Continue taking this medication, and follow the directions you see here.   doxazosin 4 MG tablet Commonly known as:  CARDURA TAKE ONE TABLET BY MOUTH ONCE DAILY   escitalopram 10 MG tablet Commonly known as:  LEXAPRO TAKE ONE TABLET BY MOUTH ONCE DAILY What changed:  Another medication with the same name was removed. Continue taking this medication, and follow the directions you see here.   ferrous sulfate 325 (65 FE) MG tablet Take 1 tablet (325 mg total) by mouth 2 (two) times daily with a meal.   furosemide 80 MG tablet Commonly known as:  LASIX Take 2 tablets (160 mg total) by mouth 2 (two) times daily. What changed:    medication strength  how much to take  when to take this   glucose blood test strip Commonly known as:  Accu-Chek Aviva Use as instructed to check blood sugar 1 time per day dx code 250.00   multivitamin capsule Take 1 capsule by mouth daily.   multivitamin with minerals Tabs tablet Take 1 tablet by mouth daily.   nystatin cream Commonly known as:  MYCOSTATIN Apply topically 2 (two) times daily. Apply 2 times daily to red, moist area in bilateral groin/ingluinal folds until healing is complete.   potassium chloride SA 20 MEQ tablet Commonly known as:  K-DUR,KLOR-CON Take 1 tablet (20 mEq total) by mouth 2 (two) times daily.            Durable Medical Equipment  (From admission, onward)         Start     Ordered   09/17/18 0950  For home use only DME 3 n 1  Once     09/17/18 0949   09/16/18 1149  For home use only DME oxygen  Once    Comments:  Plan for discharge this weekend. Will need Home O2 set up prior to discharge.  Question Answer Comment  Mode or (Route) Nasal cannula   Liters per Minute 2   Frequency Continuous (stationary and portable oxygen unit needed)   Oxygen conserving device Yes   Oxygen delivery system Gas      09/16/18 1148              Outstanding Labs/Studies   Will need repeat  BMET on 09/20/2018.   Will need to follow weights  and labs for potassium and renal function going forward given CKD stage IV.  Duration of Discharge Encounter   Greater than 30 minutes including physician time.  Signed, Darreld Mclean, PA-C 09/17/2018, 12:02 PM

## 2018-09-16 NOTE — Progress Notes (Signed)
Notified [facility] of patient plan to transition home tomorrow. Will need to fax dc summary to [phone] once completed. Agency has all other information.   Notified [facility] Patient of patient plan to transition home tomorrow. Stated that daughter had actually already notified. Will fax d/c summary tomorrow once completed to [phone]. All other information has been received.   Bartholomew Crews, RN MSN CM Transitions of Care [phone]

## 2018-09-16 NOTE — Progress Notes (Signed)
[facility] Progress Note    Assessment/ Plan:   1. AKI/CKD stage 4- in setting of decompensated CHF due to severe aortic stenosis with diuresis and concomitant ACE inhibition. Agree with holding ace and resuming diuresis - needing 80 TID IV to achieve diuresis. Difficult situation as he is a poor candidate for longterm dialysis given his multiple co-morbidities and poor functional status. - Certainly risk of progression of advanced CKD to possibly ESRD with aggressive measures. - Medical management is reasonable. - Continue Lasix 160mg  BID - F/U with [facility] in 2-3 mths 2. Acute heart failure due to severe aortic stenosis- now thinking medical management. Will convert lasix to PO- thinking will need 160 BID for now 3. HTN- stable off of lisinopril. Would not resume at all , BP should improve as volume status improves.  Be careful to not lower BP too much, feel it will improve as volume status improves, will dec norvasc to 5 daily and follow- have low threshold to stop norvasc and cardura     4. DM type 2- hold metformin due to CKD stage 4 and follow 5. Anemia of CKD-  iron stores low, giving feraheme  and started ESA- would not necessarily need as OP  6. SHPTH-  vit D level good and iPTH 31 7. Hypokalemia- start a little K supp- 20 BID - to continue 8. Dispo- if is discharged will need someone to follow weights and labs for K and renal function - possibly his PCP vs hospice  could manage as is not a candidate for dialysis   Subjective:   Feels better overall. Denies f/cn/v/dyspnea   Objective:   BP (!) 120/46 (BP Location: Left Leg)   Pulse (!) 57   Temp 97.8 F (36.6 C) (Oral)   Resp 16   Ht 6' (1.829 m)   Wt 119.8 kg Comment: scale b  SpO2 100%   BMI 35.83 kg/m   Intake/Output Summary (Last 24 hours) at 09/16/2018 1415 Last data filed at 09/16/2018 0900 Gross per 24 hour  Intake 1083 ml  Output 1800 ml  Net -717 ml   Weight change: -1.814 kg  Physical  Exam: General: obese,  SOB at rest seems better  Heart: RRR Lungs: dec BS at bases but no rales Abdomen: obese Extremities: pitting edema-  Condom cath  Imaging: No results found.  Labs: BMET Recent Labs  Lab 09/11/18 2015 09/12/18 0445 09/13/18 0508 09/14/18 0302 09/15/18 0444 09/16/18 0429  NA  --  141 143 142 141 142  K  --  3.5 3.6 3.9 4.1 4.1  CL  --  106 109 109 106 105  CO2  --  23 29 24 26 27   GLUCOSE  --  107* 125* 121* 104* 103*  BUN  --  75* 73* 76* 73* 75*  CREATININE 2.95* 2.91* 2.85* 2.92* 2.72* 2.77*  CALCIUM  --  8.6* 8.5* 8.3* 8.6* 8.8*  PHOS  --   --  4.4  --   --   --    CBC Recent Labs  Lab 09/12/18 0445 09/12/18 1651 09/13/18 0508 09/14/18 0302 09/15/18 0444  WBC 8.8  --  7.3 6.4 6.8  NEUTROABS 7.1  --   --   --   --   HGB 8.0*  --  7.9* 7.9* 8.2*  HCT 26.5* 27.2* 27.1* 27.0* 27.8*  MCV 86.6  --  87.1 86.5 87.1  PLT 166  --  145* 139* 143*    Medications:    .  amLODipine  5 mg Oral Daily  . aspirin EC  81 mg Oral Daily  . atorvastatin  20 mg Oral Daily  . bisoprolol  5 mg Oral Daily  . buPROPion  150 mg Oral Daily  . darbepoetin (ARANESP) injection - NON-DIALYSIS  100 mcg Subcutaneous Q Fri-1800  . doxazosin  4 mg Oral Daily  . escitalopram  10 mg Oral Daily  . ferrous sulfate  325 mg Oral BID WC  . furosemide  160 mg Oral BID  . heparin  5,000 Units Subcutaneous Q8H  . insulin aspart  0-15 Units Subcutaneous TID WC  . nystatin cream   Topical BID  . potassium chloride  20 mEq Oral BID  . sodium chloride flush  3 mL Intravenous Q12H      Otelia Santee, MD 09/16/2018, 2:15 PM

## 2018-09-16 NOTE — Progress Notes (Signed)
SATURATION QUALIFICATIONS: (This note is used to comply with regulatory documentation for home oxygen)  Patient Saturations on Room Air at Rest = 90-91%  Patient Saturations on Room Air while Ambulating = 87-88%   Patient Saturations on 1Liters of oxygen while Ambulating 93%  Please briefly explain why patient needs home oxygen:sats drop to 87% walking, recoveres quickly with 1l/[place] to 95%

## 2018-09-16 NOTE — Progress Notes (Addendum)
Progress Note  Patient Name: Billy [name] Date of Encounter: 09/16/2018  Primary Cardiologist: New to [facility]   Subjective   No significant overnight events. No chest pain or significant shortness of breath. Able to lay mostly flat last night. Patient has no complaints this morning and is eager to go home.  Inpatient Medications    Scheduled Meds:  amLODipine  5 mg Oral Daily   aspirin EC  81 mg Oral Daily   atorvastatin  20 mg Oral Daily   bisoprolol  5 mg Oral Daily   buPROPion  150 mg Oral Daily   darbepoetin (ARANESP) injection - NON-DIALYSIS  100 mcg Subcutaneous Q Fri-1800   doxazosin  4 mg Oral Daily   escitalopram  10 mg Oral Daily   ferrous sulfate  325 mg Oral BID WC   furosemide  160 mg Oral BID   heparin  5,000 Units Subcutaneous Q8H   insulin aspart  0-15 Units Subcutaneous TID WC   nystatin cream   Topical BID   potassium chloride  20 mEq Oral BID   sodium chloride flush  3 mL Intravenous Q12H   Continuous Infusions:  sodium chloride     ferumoxytol Stopped (09/13/18 1235)   PRN Meds: sodium chloride, acetaminophen, LORazepam, ondansetron (ZOFRAN) IV, senna-docusate, sodium chloride flush   Vital Signs    Vitals:   09/15/18 0914 09/15/18 1133 09/15/18 1940 09/16/18 0228  BP: (!) 131/41 (!) 125/53 (!) 129/48 (!) 134/51  Pulse: 66 65 63 70  Resp:  _0 Temp:  98.5 F (36.9 C) 98 F (36.7 C) 97.9 F (36.6 C)  TempSrc:  Oral  Oral  SpO2: 99% 94% 100%   Weight:    119.8 kg  Height:        Intake/Output Summary (Last 24 hours) at 09/16/2018 0806 Last data filed at 09/16/2018 0743 Gross per 24 hour  Intake 1080 ml  Output 3050 ml  Net -1970 ml   Filed Weights   09/14/18 0430 09/15/18 0527 09/16/18 0228  Weight: 123.8 kg 121.7 kg 119.8 kg    Telemetry    Sinus rhythm with heart rates in the 50's to 70's and first degree AV block. - Personally Reviewed  ECG    No new ECG tracings since 09/12/2018. - Personally  Reviewed  Physical Exam   GEN: Caucasian male resting comfortably. Alert and in no acute distress. Currently on supplemental O2 via nasal cannula. Neck: Supple.  Cardiac: RRR. III/VI systolic murmur. NO significant gallops or rubs. Respiratory: No significant increased work of breathing. Decreased breath sounds in bilateral bases with faint crackles. GI: Abdomen soft, non-distended, and non-tender. Bowel sounds present. MS: No significant lower extremity edema.  Skin: Warm and dry. Hyperpigmentation of bilateral lower extremities (left > right). Neuro:  No focal deficits. Psych: Normal affect. Responds appropriately.  Labs    Chemistry Recent Labs  Lab 09/13/18 0508 09/14/18 0302 09/15/18 0444 09/16/18 0429  NA 143 142 141 142  K 3.6 3.9 4.1 4.1  CL 109 109 106 105  CO2 _1 GLUCOSE 125* 121* 104* 103*  BUN 73* 76* 73* 75*  CREATININE 2.85* 2.92* 2.72* 2.77*  CALCIUM 8.5* 8.3* 8.6* 8.8*  PROT  --  5.7*  --   --   ALBUMIN 2.8* 2.8*  --   --   AST  --  19  --   --   ALT  --  15  --   --  ALKPHOS  --  49  --   --   BILITOT  --  0.7  --   --   GFRNONAA 20* 19* 21* 21*  GFRAA 23* 22* 24* 24*  ANIONGAP _0 Hematology Recent Labs  Lab 09/13/18 0508 09/14/18 0302 09/15/18 0444  WBC 7.3 6.4 6.8  RBC 3.11* 3.12* 3.19*  HGB 7.9* 7.9* 8.2*  HCT 27.1* 27.0* 27.8*  MCV 87.1 86.5 87.1  MCH 25.4* 25.3* 25.7*  MCHC 29.2* 29.3* 29.5*  RDW 17.8* 17.5* 17.3*  PLT 145* 139* 143*    Cardiac EnzymesNo results for input(s): TROPONINI in the last 168 hours. No results for input(s): TROPIPOC in the last 168 hours.   BNPNo results for input(s): BNP, PROBNP in the last 168 hours.   DDimer No results for input(s): DDIMER in the last 168 hours.   Radiology    No results found.  Cardiac Studies   Echocardiogram 09/12/2018:  1. The left ventricle has normal systolic function with an ejection fraction of 60-65%. The cavity size was normal. There is moderately  increased left ventricular wall thickness. indeterminate due to moderate MAC.  2. The aortic valve has an indeterminate number of cusps Moderate calcification of the aortic valve. Aortic valve regurgitation is trivial by color flow Doppler. severe stenosis of the aortic valve.  3. The right ventricle has normal systolic function. The cavity was mildly enlarged. There is no increase in right ventricular wall thickness.  4. Left atrial size was mildly dilated.  5. Right atrial size was mildly dilated.  6. The mitral valve is degenerative. There is moderate mitral annular calcification present. Mild mitral valve stenosis.  7. The inferior vena cava was dilated in size with <50% respiratory variability.  Summary: LVEF 60-65%, moderate LVH, normal wall motion, calcified aortic valve with severe stenosis - mean gradient only 20 mmHg (suspect undersampled) -AVA around 1.0 cm2 (based on LVOT diameter of 2.2 cm), mild mitral stenosis (HR 68) with trivial MR, mild biatrial enlargment, no significant TR, dilated IVC that does not collapse.  Patient Profile   Billy [name] is a 84 y.o. male with a history of hypertension, hyperlipidemia, diabetes mellitus, obesity, and CKD stage IV who was transferred from [facility] on 09/11/2018 for volume overload, acute on chronic renal failure, and a new finding of severe aortic stenosis.  Assessment & Plan    Acute Diastolic Heart Failure in the Setting of Severe Aortic Stenosis Patient had a long discussion with Dr. Burt Knack about the option of valve replacement with TAVR.  Due to his kidney disease, it was felt that this was not a viable option.  Palliative care has been consulted.  The patient has plans to go home without any further work-up of his valvular heart disease. Nephrology has been adjusting Lasix and patient is currently on PO Lasix 161m twice daily. O2 saturations on room air dropped to 88% while ambulating on 09/13/2018 and improved to 96% on 2L of O2.  Will reassess today.  Hypertension - BP currently stable. - Continue current medications: Amlodipine 567mdaily, Bisoprolol 5,g daily, and Cardura 62m3maily.  Hyperlipidemia - LDL this admission 46. - Continue Lipitor 4m41mily.  Diabetes Mellitus  - Continue sliding scale insulin.  CKD Stage IV - Serum creatinine stable at 2.77 today.  - Per Nephrology notes, patient felt to be poor candidate for long-term dialysis given his multiple co-morbidities and poor functional status. - Continue to hold home ACE and  home Metformin. - Continue diuresis as above.  Anemia  - Felt to be secondary to CKD. - Hemoglobin stable at 8.2 yesterday. - Receiving Ferrous Sulfate and ESA was started. May not be necessary as outpatient per Nephrology.   Of note, patient's mobile home is being moved to a new location today and he will not have power until tomorrow. However, RN spoke with family who said he could stay with someone for the night if he ends of being discharged today.   For questions or updates, please contact [place] Please consult [email or web address] for contact info under Cardiology/STEMI.      Signed, Darreld Mclean, PA-C  09/16/2018, 8:06 AM   As above, patient seen and examined.  He denies dyspnea or chest pain.  His saturations decreased to 87 to 88% with ambulation.  We will discuss with palliative care.  We are working on arranging home oxygen and other needs for discharge.  Continue Lasix at present dose.  He will need potassium and renal function checked 3 days following discharge.  He will also need follow-up in [place] with 1 of our cardiologist there 1-2 weeks following DC.  Plan is for medical therapy only.  He has declined long-term dialysis and therefore cannot undergo work-up for TAVR due to risk of contrast nephropathy associated with necessary cardiac catheterization and CTA.  Kirk Ruths, MD

## 2018-09-16 NOTE — Progress Notes (Signed)
Spoke with patient's daughter, [name], to discuss transition home. [name] stated that they would like to bring patient home today. Discussed choice for home health - [facility] [phone]. Referral made to [facility] for RN, PT, OT, Aide, and SW. Faxed information to [phone]. Choice for oxygen made - [facility]. Referral made and information faxed. Discussed with [name] about [facility] - she agreed and stated that the had already reached out. CM to continue to follow for transition of care needs.   Bartholomew Crews, RN MSN CCM Transitions of Care [phone]

## 2018-09-17 LAB — BASIC METABOLIC PANEL
Anion gap: 5 (ref 5–15)
BUN: 77 mg/dL — ABNORMAL HIGH (ref 8–23)
CO2: 25 mmol/L (ref 22–32)
CREATININE: 2.72 mg/dL — AB (ref 0.61–1.24)
Calcium: 8.3 mg/dL — ABNORMAL LOW (ref 8.9–10.3)
Chloride: 108 mmol/L (ref 98–111)
GFR calc Af Amer: 24 mL/min — ABNORMAL LOW (ref >60)
GFR, EST NON AFRICAN AMERICAN: 21 mL/min — AB (ref >60)
Glucose, Bld: 138 mg/dL — ABNORMAL HIGH (ref 70–99)
Potassium: 4.2 mmol/L (ref 3.5–5.1)
Sodium: 138 mmol/L (ref 135–145)

## 2018-09-17 LAB — CBC
HCT: 26.5 % — ABNORMAL LOW (ref 39.0–52.0)
Hemoglobin: 8.1 g/dL — ABNORMAL LOW (ref 13.0–17.0)
MCH: 26.9 pg (ref 26.0–34.0)
MCHC: 30.6 g/dL (ref 30.0–36.0)
MCV: 88 fL (ref 80.0–100.0)
PLATELETS: 139 10*3/uL — AB (ref 150–400)
RBC: 3.01 MIL/uL — ABNORMAL LOW (ref 4.22–5.81)
RDW: 18 % — AB (ref 11.5–15.5)
WBC: 6.2 10*3/uL (ref 4.0–10.5)
nRBC: 0 % (ref 0.0–0.2)

## 2018-09-17 LAB — GLUCOSE, CAPILLARY
Glucose-Capillary: 92 mg/dL (ref 70–99)
Glucose-Capillary: 146 mg/dL — ABNORMAL HIGH (ref 70–99)
Glucose-Capillary: 103 mg/dL — ABNORMAL HIGH (ref 70–99)

## 2018-09-17 MED ORDER — FUROSEMIDE 80 MG PO TABS: 160.0000 mg | ORAL_TABLET | Freq: Two times a day (BID) | ORAL | 2 refills | Status: DC

## 2018-09-17 MED ORDER — FERROUS SULFATE 325 (65 FE) MG PO TABS: 325.0000 mg | ORAL_TABLET | Freq: Two times a day (BID) | ORAL | 0 refills | Status: DC

## 2018-09-17 MED ORDER — AMLODIPINE BESYLATE 5 MG PO TABS: 5.0000 mg | ORAL_TABLET | Freq: Every day | ORAL | 2 refills | Status: DC

## 2018-09-17 MED ORDER — BISOPROLOL FUMARATE 5 MG PO TABS: 5.0000 mg | ORAL_TABLET | Freq: Every day | ORAL | 2 refills | Status: DC

## 2018-09-17 MED ORDER — NYSTATIN 100000 UNIT/GM EX CREA: TOPICAL_CREAM | Freq: Two times a day (BID) | CUTANEOUS | 0 refills | Status: DC

## 2018-09-17 MED ORDER — POTASSIUM CHLORIDE CRYS ER 20 MEQ PO TBCR: 20.0000 meq | EXTENDED_RELEASE_TABLET | Freq: Two times a day (BID) | ORAL | 2 refills | Status: DC

## 2018-09-17 NOTE — Progress Notes (Signed)
Progress Note  Patient Name: Billy [name] Date of Encounter: 09/17/2018  Primary Cardiologist: Dr Burt Knack  Subjective   Denies CP or dyspnea  Inpatient Medications    Scheduled Meds: . amLODipine  5 mg Oral Daily  . aspirin EC  81 mg Oral Daily  . atorvastatin  20 mg Oral Daily  . bisoprolol  5 mg Oral Daily  . buPROPion  150 mg Oral Daily  . darbepoetin (ARANESP) injection - NON-DIALYSIS  100 mcg Subcutaneous Q Fri-1800  . doxazosin  4 mg Oral Daily  . escitalopram  10 mg Oral Daily  . ferrous sulfate  325 mg Oral BID WC  . furosemide  160 mg Oral BID  . heparin  5,000 Units Subcutaneous Q8H  . insulin aspart  0-15 Units Subcutaneous TID WC  . nystatin cream   Topical BID  . potassium chloride  20 mEq Oral BID  . sodium chloride flush  3 mL Intravenous Q12H   Continuous Infusions: . sodium chloride    . ferumoxytol Stopped (09/13/18 1235)   PRN Meds: sodium chloride, acetaminophen, LORazepam, ondansetron (ZOFRAN) IV, senna-docusate, sodium chloride flush   Vital Signs    Vitals:   09/16/18 2027 09/17/18 0149 09/17/18 0548 09/17/18 0816  BP: 124/80  115/70 124/70  Pulse: 63  70 71  Resp: 20  18   Temp: 98.4 F (36.9 C)  98.6 F (37 C)   TempSrc: Oral  Oral   SpO2: 100%  90%   Weight:  118.4 kg    Height:        Intake/Output Summary (Last 24 hours) at 09/17/2018 0945 Last data filed at 09/17/2018 0851 Gross per 24 hour  Intake 1140 ml  Output 3025 ml  Net -1885 ml   Last 3 Weights 09/17/2018 09/16/2018 09/15/2018  Weight (lbs) 261 lb 1.6 oz 264 lb 3.2 oz 268 lb 3.2 oz  Weight (kg) 118.434 kg 119.84 kg 121.655 kg      Telemetry    Sinus- Personally Reviewed  Physical Exam   GEN: No acute distress.   Neck: supple Cardiac: RRR, 2/6 systolic murmur Respiratory: CTA GI: Soft, nontender, non-distended  MS: Trace edema; Chronic skin changes Neuro:  Nonfocal    Labs    Chemistry Recent Labs  Lab 09/13/18 0508 09/14/18 0302 09/15/18 0444  09/16/18 0429  NA 143 142 141 142  K 3.6 3.9 4.1 4.1  CL 109 109 106 105  CO2 29 24 26 27   GLUCOSE 125* 121* 104* 103*  BUN 73* 76* 73* 75*  CREATININE 2.85* 2.92* 2.72* 2.77*  CALCIUM 8.5* 8.3* 8.6* 8.8*  PROT  --  5.7*  --   --   ALBUMIN 2.8* 2.8*  --   --   AST  --  19  --   --   ALT  --  15  --   --   ALKPHOS  --  49  --   --   BILITOT  --  0.7  --   --   GFRNONAA 20* 19* 21* 21*  GFRAA 23* 22* 24* 24*  ANIONGAP 5 9 9 10      Hematology Recent Labs  Lab 09/13/18 0508 09/14/18 0302 09/15/18 0444  WBC 7.3 6.4 6.8  RBC 3.11* 3.12* 3.19*  HGB 7.9* 7.9* 8.2*  HCT 27.1* 27.0* 27.8*  MCV 87.1 86.5 87.1  MCH 25.4* 25.3* 25.7*  MCHC 29.2* 29.3* 29.5*  RDW 17.8* 17.5* 17.3*  PLT 145* 139* 143*    Patient  Profile     Billy [name] is a 84 y.o. male with a history of hypertension, hyperlipidemia, diabetes mellitus, obesity, and CKD stage IV who was transferred from [facility] on 09/11/2018 for volume overload, acute on chronic renal failure, and a new finding of severe aortic stenosis.  He has declined dialysis and therefore felt not to be a candidate for TAVR because of risk of contrast nephropathy associated with cardiac catheterization and CTA to fully assess.  Assessment & Plan    1 acute diastolic congestive heart failure-this is all felt secondary to aortic stenosis.  Volume status has improved.  Continue present dose of Lasix.  We will need to check potassium and renal function on Friday.  Needs fluid restriction and low-sodium diet.  2 severe aortic stenosis-patient has been evaluated for TAVR.  He is not willing to consider dialysis long-term and therefore not considered a candidate for TAVR (risk of contrast nephropathy associated with CTA and cardiac catheterization).  Plan is medical therapy.  He is a no CODE BLUE.  3 hypertension-patient's blood pressure is controlled.  Continue present medications and follow.  4 chronic stage IV kidney disease-we will need  close follow-up of renal function after discharge.  5 hyperlipidemia-continue statin.  Plan discharge today if arrangements for home health and where patient will stay can be made.  Check potassium and renal function on March 27.  Will need follow-up in [place] in approximately 2 weeks.  >30 min PA and physician time D2  For questions or updates, please contact [place] Please consult [email or web address] for contact info under        Signed, Kirk Ruths, MD  09/17/2018, 9:45 AM

## 2018-09-17 NOTE — Progress Notes (Signed)
Received phone call from [name] - patient's daughter - this morning. She is planning for patient to transition home today. Requesting 3N1 d/t unable to access the one he has in his trailer. Choice offered - she stated that [facility] is out, and agreed to [facility]. MD notified for order. Call to [facility] for delivery - will deliver after 12 pm today.   Bartholomew Crews, RN MSN CCM Transitions of Care [phone]

## 2018-09-17 NOTE — Discharge Instructions (Signed)
Please take all medications as directed elsewhere on discharge summary.   Metformin has been stopped due to kidney function. Please continue to monitor your blood sugars closely at home and if your blood sugar is consistently high, please notify your primary care physician.   We will have a home health nurse come and check a BMET on 09/20/2018 to assess electrolytes (spefically potassium) and renal function.    You have a hospital follow-up visit in our [place] office scheduled for 10/04/2018 at 10:40am. There is a chance that this appointment could get rescheduled due to the coronavirus but our office will call you if that happens.  Please call your primary care physician (PCP) and schedule a follow-up visit in 1-2 weeks. You should also follow-up with an outpatient Nephrologist. Please follow-up with your PCP in regards to the Nephrology referral placed prior to this hospitalization.  Call our office if you have any questions.

## 2018-09-17 NOTE — Progress Notes (Signed)
[facility] Progress Note    Assessment/ Plan:   1. AKI/CKD stage 4- in setting of decompensated CHF due to severe aortic stenosis with diuresis and concomitant ACE inhibition. Agree with holding ace and resuming diuresis- needing 80 TID IV to achieve diuresis. Difficult situation as he is a poor candidate for longterm dialysis given his multiple co-morbidities and poor functional status. - Certainly risk of progression of advanced CKD to possibly ESRD with aggressive measures. - Medical management is reasonable. - Continue Lasix 160mg  BID; no BMET yet today but ordered. - From renal standpoint can f/u with [facility] in 2-3 mths - Will sign off at this time; please reconsult as needed.  2. Acute heart failure due to severe aortic stenosis- now thinking medical management.He's currently on  lasix to PO 160 BID 3. HTN-stable off of lisinopril. Would not resume at all,  decreased norvasc to 5 daily and follow- have low threshold to stop norvasc and cardura   4. DM type 2- hold metformin due to CKD stage 4 and follow 5. Anemia of CKD- iron stores low, giving feraheme and started ESA- would not necessarily need as OP  6. SHPTH- vit D level good and iPTH 31 7. Hypokalemia- start a little K supp- 20 BID- to continue as long as he's on Lasix but will need to follow BMET to ensure stability. 8. Dispo- if is discharged will need someone to followweights andlabs for K and renal function - possibly his PCP vs hospice could manage as is not a candidate for dialysis   Subjective:   Feels better overall. Denies f/cn/v/dyspnea Able to walk down hall.   Objective:   BP 124/70   Pulse 71   Temp 98.6 F (37 C) (Oral)   Resp 18   Ht 6' (1.829 m)   Wt 118.4 kg   SpO2 90%   BMI 35.41 kg/m   Intake/Output Summary (Last 24 hours) at 09/17/2018 1022 Last data filed at 09/17/2018 0851 Gross per 24 hour  Intake 1140 ml  Output 3025 ml  Net -1885 ml   Weight change: -1.406  kg  Physical Exam: General: obese, SOB at rest seems better Heart: RRR Lungs: dec BS at bases but no rales Abdomen: obese Extremities: pitting edema  Imaging: No results found.  Labs: BMET Recent Labs  Lab 09/11/18 2015 09/12/18 0445 09/13/18 0508 09/14/18 0302 09/15/18 0444 09/16/18 0429  NA  --  141 143 142 141 142  K  --  3.5 3.6 3.9 4.1 4.1  CL  --  106 109 109 106 105  CO2  --  23 29 24 26 27   GLUCOSE  --  107* 125* 121* 104* 103*  BUN  --  75* 73* 76* 73* 75*  CREATININE 2.95* 2.91* 2.85* 2.92* 2.72* 2.77*  CALCIUM  --  8.6* 8.5* 8.3* 8.6* 8.8*  PHOS  --   --  4.4  --   --   --    CBC Recent Labs  Lab 09/12/18 0445  09/13/18 0508 09/14/18 0302 09/15/18 0444 09/17/18 0936  WBC 8.8  --  7.3 6.4 6.8 6.2  NEUTROABS 7.1  --   --   --   --   --   HGB 8.0*  --  7.9* 7.9* 8.2* 8.1*  HCT 26.5*   < > 27.1* 27.0* 27.8* 26.5*  MCV 86.6  --  87.1 86.5 87.1 88.0  PLT 166  --  145* 139* 143* 139*   < > = values  in this interval not displayed.    Medications:    . amLODipine  5 mg Oral Daily  . aspirin EC  81 mg Oral Daily  . atorvastatin  20 mg Oral Daily  . bisoprolol  5 mg Oral Daily  . buPROPion  150 mg Oral Daily  . darbepoetin (ARANESP) injection - NON-DIALYSIS  100 mcg Subcutaneous Q Fri-1800  . doxazosin  4 mg Oral Daily  . escitalopram  10 mg Oral Daily  . ferrous sulfate  325 mg Oral BID WC  . furosemide  160 mg Oral BID  . heparin  5,000 Units Subcutaneous Q8H  . insulin aspart  0-15 Units Subcutaneous TID WC  . nystatin cream   Topical BID  . potassium chloride  20 mEq Oral BID  . sodium chloride flush  3 mL Intravenous Q12H      Otelia Santee, MD 09/17/2018, 10:22 AM

## 2018-09-17 NOTE — Care Management Important Message (Signed)
Important Message  Patient Details  Name: Billy [name] MRN: [id number] Date of Birth: 24-Aug-1934   Medicare Important Message Given:  Yes    Dominic Mahaney 09/17/2018, 1:26 PM

## 2018-09-24 NOTE — Progress Notes (Signed)
Received call from Billy [name] Patient at [phone] [name]) stating that clinical notes needed for oxygen need were never received. Notes faxed to [phone]. Follow up call to [name] to let her know clinical notes were faxed.   Bartholomew Crews, RN Transitions of Care CM [phone]

## 2018-09-30 ENCOUNTER — Telehealth: Payer: Self-pay

## 2018-09-30 NOTE — Telephone Encounter (Signed)
Trying to call patient about appointment on 4/10 to confirm he is okay with Televisit

## 2018-10-01 NOTE — Telephone Encounter (Signed)
Attempted to contact daughter, still no answer.

## 2018-10-01 NOTE — Telephone Encounter (Signed)
Spoke to patient's daughter regarding upcoming appointment. Patient's daughter is concerned due to the patient recently being taken off lisinopril due to kidney issues and wants to make sure Dr. Agustin Cree is aware of this. Dr. Agustin Cree was informed and advised that we schedule patient for a televisit. Called daughter back to informed her of this at [phone] however there was no answer and no option to leave a voicemail. Will continue efforts.

## 2018-10-01 NOTE — Telephone Encounter (Signed)
Attempted to call patient's daughter again, the grandchild answered, she said she would have [name] call us back.

## 2018-10-03 ENCOUNTER — Telehealth: Payer: Self-pay | Admitting: Cardiology

## 2018-10-03 NOTE — Telephone Encounter (Signed)
YOUR CARDIOLOGY TEAM HAS ARRANGED FOR AN E-VISIT FOR YOUR APPOINTMENT - PLEASE REVIEW IMPORTANT INFORMATION BELOW SEVERAL DAYS PRIOR TO YOUR APPOINTMENT  Due to the recent COVID-19 pandemic, we are transitioning in-person office visits to tele-medicine visits in an effort to decrease unnecessary exposure to our patients and staff. Medicare and most insurances are covering these visits without a copay needed. We also encourage you to sign up for MyChart if you have not already done so. You will need a smartphone if possible. For patients that do not have this, we can still complete the visit using a regular telephone but do prefer a smartphone to enable video when possible. You may have a close family member that lives with you that can help. If possible, we also ask that you have a blood pressure cuff and scale at home to measure your blood pressure, heart rate and weight prior to your scheduled appointment. Patients with clinical needs that need an in-person evaluation and testing will still be able to come to the office if absolutely necessary. If you have any questions, feel free to call our office.     CONSENT FOR TELE-HEALTH VISIT - PLEASE REVIEW  I hereby voluntarily request, consent and authorize [facility] and its employed or contracted physicians, physician assistants, nurse practitioners or other licensed health care professionals (the Practitioner), to provide me with telemedicine health care services (the Services") as deemed necessary by the treating Practitioner. I acknowledge and consent to receive the Services by the Practitioner via telemedicine. I understand that the telemedicine visit will involve communicating with the Practitioner through live audiovisual communication technology and the disclosure of certain medical information by electronic transmission. I acknowledge that I have been given the opportunity to request an in-person assessment or other available alternative prior to  the telemedicine visit and am voluntarily participating in the telemedicine visit.  I understand that I have the right to withhold or withdraw my consent to the use of telemedicine in the course of my care at any time, without affecting my right to future care or treatment, and that the Practitioner or I may terminate the telemedicine visit at any time. I understand that I have the right to inspect all information obtained and/or recorded in the course of the telemedicine visit and may receive copies of available information for a reasonable fee.  I understand that some of the potential risks of receiving the Services via telemedicine include:   Delay or interruption in medical evaluation due to technological equipment failure or disruption;  Information transmitted may not be sufficient (e.g. poor resolution of images) to allow for appropriate medical decision making by the Practitioner; and/or   In rare instances, security protocols could fail, causing a breach of personal health information.  Furthermore, I acknowledge that it is my responsibility to provide information about my medical history, conditions and care that is complete and accurate to the best of my ability. I acknowledge that Practitioner's advice, recommendations, and/or decision may be based on factors not within their control, such as incomplete or inaccurate data provided by me or distortions of diagnostic images or specimens that may result from electronic transmissions. I understand that the practice of medicine is not an exact science and that Practitioner makes no warranties or guarantees regarding treatment outcomes. I acknowledge that I will receive a copy of this consent concurrently upon execution via email to the email address I last provided but may also request a printed copy by calling the office of [facility].    I understand that my insurance will be billed for this visit.   I have read or had this consent read to  me.  I understand the contents of this consent, which adequately explains the benefits and risks of the Services being provided via telemedicine.   I have been provided ample opportunity to ask questions regarding this consent and the Services and have had my questions answered to my satisfaction.  I give my informed consent for the services to be provided through the use of telemedicine in my medical care  By participating in this telemedicine visit I agree to the above.  Patient's daughter gives verbal consent for televisit 10/03/2018 pp

## 2018-10-04 ENCOUNTER — Other Ambulatory Visit: Payer: Self-pay

## 2018-10-04 ENCOUNTER — Encounter: Payer: Self-pay | Admitting: Cardiology

## 2018-10-04 ENCOUNTER — Telehealth (INDEPENDENT_AMBULATORY_CARE_PROVIDER_SITE_OTHER): Payer: Medicare HMO | Admitting: Cardiology

## 2018-10-04 ENCOUNTER — Telehealth: Payer: Self-pay | Admitting: *Deleted

## 2018-10-04 DIAGNOSIS — I110 Hypertensive heart disease with heart failure: Secondary | ICD-10-CM

## 2018-10-04 DIAGNOSIS — I509 Heart failure, unspecified: Secondary | ICD-10-CM | POA: Diagnosis not present

## 2018-10-04 DIAGNOSIS — I5033 Acute on chronic diastolic (congestive) heart failure: Secondary | ICD-10-CM

## 2018-10-04 DIAGNOSIS — I350 Nonrheumatic aortic (valve) stenosis: Secondary | ICD-10-CM

## 2018-10-04 DIAGNOSIS — N184 Chronic kidney disease, stage 4 (severe): Secondary | ICD-10-CM

## 2018-10-04 DIAGNOSIS — I10 Essential (primary) hypertension: Secondary | ICD-10-CM

## 2018-10-04 NOTE — Progress Notes (Signed)
Virtual Visit via Video Note   This visit type was conducted due to national recommendations for restrictions regarding the COVID-19 Pandemic (e.g. social distancing) in an effort to limit this patient's exposure and mitigate transmission in our community.  Due to his co-morbid illnesses, this patient is at least at moderate risk for complications without adequate follow up.  This format is felt to be most appropriate for this patient at this time.  All issues noted in this document were discussed and addressed.  A limited physical exam was performed with this format.  Please refer to the patient's chart for his consent to telehealth for [name].  Evaluation Performed:  Follow-up visit  This visit type was conducted due to national recommendations for restrictions regarding the COVID-19 Pandemic (e.g. social distancing).  This format is felt to be most appropriate for this patient at this time.  All issues noted in this document were discussed and addressed.  No physical exam was performed (except for noted visual exam findings with Video Visits).  Please refer to the patient's chart (MyChart message for video visits and phone note for telephone visits) for the patient's consent to telehealth for [name].  Date:  10/04/2018  ID: [name], DOB Sep 13, 1934, MRN [id number]   Patient Location:  [name] [id number]   Provider location:   [name]  PCP:  Patient, No Pcp Per  Cardiologist:  Billy Campus, MD     Chief Complaint: I have upset stomach  History of Present Illness:    [name] is a 84 y.o. male  who presents via audio/video conferencing for a telehealth visit today.  Very complex past medical history.  He does have aortic stenosis which appears to be significant.  On top of that he does have chronic renal failure with creatinine between 2 and 3.  Recently he ended up going to [name] because of congestive heart  failure.  He was aggressively diuresed.  There was also some conversation about potentially doing evaluation for TAVR however because of his comorbidity of swallows because of his wishes that was not done.  We decided to proceed with conservative approach.  Since that time he has been discharged from [name] he is doing fair.  For last today he described to have some upset stomach but no diarrhea no vomiting.  His daughter who is also my patient is helping him a lot.  Actually I spoke to her mother who is also my patient yesterday.  Today to the video link unable to talk to [name].  He does not have swelling of lower extremities he seems to be alert awake oriented x3 somewhat sluggish but according to his daughter this is his normal state.  Sadly before hospitalization he was able to drive now he had difficulty moving around and walking around.  His daughter and try to help him and make him to walk around he does somewhat better but still there is a long way to go.  He does have a nurse who comes to see them from time to time I will try to reach that person and see if we will be able to do Chem-7 and proBNP as well as CBC on him.  I am worried about potentially worsening of his kidney function with quite aggressive diuresis.  He denies have any chest pain tightness squeezing pressure burning chest.  There is some shortness of breath with exertion.  He sleeps okay.  He is aware of restriction of fluid and salt.   The patient does not have symptoms concerning for COVID-19 infection (fever, chills, cough, or new SHORTNESS OF BREATH).    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 CKD (chronic kidney disease), stage IV ([place])   . Diabetes mellitus without complication ([place])   . Hypertension   . Mixed hyperlipidemia   . Morbid obesity ([place])   . Severe aortic stenosis    a. 09/09/2018 Echo [facility]): EF 55-60%, mod conc LVH. Nl RV fxn. Sev dil RA w/ elevated LA pressure. Mod RAE. Sev AS w/ peak grad of 64mmHg, mean of 82mmHg. AoV area 0.7cm. Mod TR. RVSP 42mmHg.    Past Surgical History:  Procedure Laterality Date  . NO PAST SURGERIES       Current Meds  Medication Sig  . amLODipine (NORVASC) 5 MG tablet Take 1 tablet (5 mg total) by mouth daily.  Marland Kitchen aspirin EC 81 MG tablet Take 81 mg by mouth daily.  Marland Kitchen atorvastatin (LIPITOR) 20 MG tablet Take 1 tablet (20 mg total) by mouth daily.  . bisoprolol (ZEBETA) 5 MG tablet Take 1 tablet (5 mg total) by mouth daily.  Marland Kitchen buPROPion (WELLBUTRIN XL) 150 MG 24 hr tablet Take 1 tablet (150 mg total) by mouth daily.  Marland Kitchen doxazosin (CARDURA) 4 MG tablet TAKE ONE TABLET BY MOUTH ONCE DAILY  . escitalopram (LEXAPRO) 10 MG tablet TAKE ONE TABLET BY MOUTH ONCE DAILY  . ferrous sulfate 325 (65 FE) MG tablet Take 1 tablet (325 mg total) by mouth 2 (two) times daily with a meal.  . furosemide (LASIX) 80 MG tablet Take 2 tablets (160 mg total) by mouth 2 (two) times daily.  Marland Kitchen glucose blood (ACCU-CHEK AVIVA) test strip Use as instructed to check blood sugar 1 time per day dx code 250.00  . Multiple Vitamin (MULTIVITAMIN WITH MINERALS) TABS tablet Take 1 tablet by mouth daily.  . Multiple Vitamin (MULTIVITAMIN) capsule Take 1 capsule by mouth daily.  Marland Kitchen nystatin cream (MYCOSTATIN) Apply topically 2 (two) times daily. Apply 2 times daily to red, moist area in bilateral groin/ingluinal folds until healing is complete.  . potassium chloride SA (K-DUR,KLOR-CON) 20 MEQ tablet Take 1 tablet (20 mEq total) by mouth 2 (two)  times daily.      Family History: The patient's family history is not on file.   ROS:   Please see the history of present illness.     All other systems reviewed and are negative.   Labs/Other Tests and Data Reviewed:     Recent Labs: 09/14/2018: ALT 15 09/17/2018: BUN 77; Creatinine, Ser 2.72; Hemoglobin 8.1; Platelets 139; Potassium 4.2; Sodium 138  Recent Lipid Panel    Component Value Date/Time   CHOL 91 09/14/2018 0302   TRIG 91 09/14/2018 0302   HDL 27 (L) 09/14/2018 0302   CHOLHDL 3.4 09/14/2018 0302   VLDL 18 09/14/2018 0302   LDLCALC 46 09/14/2018 0302   LDLDIRECT 97.2 01/30/2014 1609      Exam:  Vital Signs:  There were no vitals taken for this visit.    Wt Readings from Last 3 Encounters:  09/17/18 261 lb 1.6 oz (118.4 kg)  01/30/14 278 lb 12.8 oz (126.5 kg)  12/11/13 276 lb 9.6 oz (125.5 kg)     Well nourished, well developed male in no acute distress. Alert awake 3.  Some sluggish response.  According to his daughter there is normal.  I do not see any JVD his legs are not swollen.  Diagnosis for this visit:   1. Essential hypertension, benign   2. Severe aortic stenosis      ASSESSMENT & PLAN:    1.  Severe aortic stenosis according to last echocardiogram.  [facility] was recently [facility] to [name] because of congestive heart failure and that being decompensated.  He was aggressively managed with diuretic improved significantly.  He is daily size is decreasing.  I am worried about his electrolytes therefore make arrangements for him to have Chem-7 done.  proBNP will be done as well. 2.  Decompensated congestive heart failure.  Seems to be doing better from that point of view.  We will get laboratory test. 3.  Essential hypertension blood pressure elevated today 173/41 however it was done using a wrist blood pressure monitor.  Again we will make arrangements for nurse to come and see him to check his blood pressure.  We may be forced to  increase dose of amlodipine. 4.  Chronic kidney failure.  Will check Chem-7  COVID-19 Education: The signs and symptoms of COVID-19 were discussed with the patient and how to seek care for testing (follow up with PCP or arrange E-visit).  The importance of social distancing was discussed today.  Patient Risk:   After full review of this patients clinical status, I feel that they are at least moderate risk at this time.  Time:   Today, I have spent 21 minutes with the patient with telehealth technology discussing pt health issues. Visit was finished at 11 AM.    Medication Adjustments/Labs and Tests Ordered: Current medicines are reviewed at length with the patient today.  Concerns regarding medicines are outlined above.  No orders of the defined types were placed in this encounter.  Medication changes: No orders of the defined types were placed in this encounter.    Disposition: Chem-7, proBNP, will schedule him to see me through the video link in 3 weeks  Signed, [name] Liter, MD, [facility] 10/04/2018 10:55 AM    [place]

## 2018-10-04 NOTE — Patient Instructions (Signed)
Medication Instructions:  Your physician recommends that you continue on your current medications as directed. Please refer to the Current Medication list given to you today.  If you need a refill on your cardiac medications before your next appointment, please call your pharmacy.   Lab work: Will consult with your homecare RN to have labs drawn at home: CBC,BMP, BNP  If you have labs (blood work) drawn today and your tests are completely normal, you will receive your results only by: Marland Kitchen MyChart Message (if you have MyChart) OR . A paper copy in the mail If you have any lab test that is abnormal or we need to change your treatment, we will call you to review the results.  Testing/Procedures: None  Follow-Up: At [facility], you and your health needs are our priority.  As part of our continuing mission to provide you with exceptional heart care, we have created designated Provider Care Teams.  These Care Teams include your primary Cardiologist (physician) and Advanced Practice Providers (APPs -  Physician Assistants and Nurse Practitioners) who all work together to provide you with the care you need, when you need it. You will need a follow up appointment in 3 weeks.  Please call our office 2 months in advance to schedule this appointment.  You may see No primary care provider on file. or another member of our [employer] Provider Team in [place]: Shirlee More, MD . Jyl Heinz, MD  Any Other Special Instructions Will Be Listed Below (If Applicable).

## 2018-10-04 NOTE — Addendum Note (Signed)
Addended by: Particia Nearing B on: 10/04/2018 11:39 AM   Modules accepted: Orders

## 2018-10-04 NOTE — Telephone Encounter (Signed)
Attempted to call patients dtr to go over DC instuctions and make appt. No option to leave message. Will continue efforts

## 2018-10-09 ENCOUNTER — Inpatient Hospital Stay (HOSPITAL_COMMUNITY)
Admission: AD | Admit: 2018-10-09 | Discharge: 2018-10-17 | DRG: 682 | Disposition: A | Discharge status: Hospice | Payer: Medicare HMO | Attending: Internal Medicine | Admitting: Internal Medicine

## 2018-10-09 ENCOUNTER — Telehealth: Payer: Self-pay | Admitting: Emergency Medicine

## 2018-10-09 DIAGNOSIS — D631 Anemia in chronic kidney disease: Secondary | ICD-10-CM | POA: Diagnosis present

## 2018-10-09 DIAGNOSIS — I350 Nonrheumatic aortic (valve) stenosis: Secondary | ICD-10-CM | POA: Diagnosis not present

## 2018-10-09 DIAGNOSIS — Z7189 Other specified counseling: Secondary | ICD-10-CM

## 2018-10-09 DIAGNOSIS — Z66 Do not resuscitate: Secondary | ICD-10-CM | POA: Diagnosis not present

## 2018-10-09 DIAGNOSIS — J9601 Acute respiratory failure with hypoxia: Secondary | ICD-10-CM | POA: Clinically undetermined

## 2018-10-09 DIAGNOSIS — Z515 Encounter for palliative care: Secondary | ICD-10-CM | POA: Diagnosis not present

## 2018-10-09 DIAGNOSIS — R451 Restlessness and agitation: Secondary | ICD-10-CM | POA: Diagnosis not present

## 2018-10-09 DIAGNOSIS — I130 Hypertensive heart and chronic kidney disease with heart failure and stage 1 through stage 4 chronic kidney disease, or unspecified chronic kidney disease: Secondary | ICD-10-CM | POA: Diagnosis present

## 2018-10-09 DIAGNOSIS — X58XXXA Exposure to other specified factors, initial encounter: Secondary | ICD-10-CM | POA: Diagnosis present

## 2018-10-09 DIAGNOSIS — E872 Acidosis: Secondary | ICD-10-CM | POA: Diagnosis not present

## 2018-10-09 DIAGNOSIS — E119 Type 2 diabetes mellitus without complications: Secondary | ICD-10-CM

## 2018-10-09 DIAGNOSIS — S2241XA Multiple fractures of ribs, right side, initial encounter for closed fracture: Secondary | ICD-10-CM | POA: Diagnosis present

## 2018-10-09 DIAGNOSIS — L8990 Pressure ulcer of unspecified site, unspecified stage: Secondary | ICD-10-CM

## 2018-10-09 DIAGNOSIS — J189 Pneumonia, unspecified organism: Secondary | ICD-10-CM | POA: Diagnosis not present

## 2018-10-09 DIAGNOSIS — L03315 Cellulitis of perineum: Secondary | ICD-10-CM | POA: Diagnosis not present

## 2018-10-09 DIAGNOSIS — N19 Unspecified kidney failure: Secondary | ICD-10-CM | POA: Diagnosis not present

## 2018-10-09 DIAGNOSIS — R0789 Other chest pain: Secondary | ICD-10-CM | POA: Diagnosis not present

## 2018-10-09 DIAGNOSIS — I5033 Acute on chronic diastolic (congestive) heart failure: Secondary | ICD-10-CM | POA: Diagnosis not present

## 2018-10-09 DIAGNOSIS — G92 Toxic encephalopathy: Secondary | ICD-10-CM | POA: Diagnosis present

## 2018-10-09 DIAGNOSIS — J90 Pleural effusion, not elsewhere classified: Secondary | ICD-10-CM | POA: Diagnosis present

## 2018-10-09 DIAGNOSIS — D649 Anemia, unspecified: Secondary | ICD-10-CM | POA: Diagnosis present

## 2018-10-09 DIAGNOSIS — Z79899 Other long term (current) drug therapy: Secondary | ICD-10-CM

## 2018-10-09 DIAGNOSIS — J918 Pleural effusion in other conditions classified elsewhere: Secondary | ICD-10-CM | POA: Diagnosis not present

## 2018-10-09 DIAGNOSIS — G9341 Metabolic encephalopathy: Secondary | ICD-10-CM | POA: Diagnosis not present

## 2018-10-09 DIAGNOSIS — Y95 Nosocomial condition: Secondary | ICD-10-CM | POA: Diagnosis not present

## 2018-10-09 DIAGNOSIS — Z781 Physical restraint status: Secondary | ICD-10-CM

## 2018-10-09 DIAGNOSIS — Z9889 Other specified postprocedural states: Secondary | ICD-10-CM

## 2018-10-09 DIAGNOSIS — Z87891 Personal history of nicotine dependence: Secondary | ICD-10-CM

## 2018-10-09 DIAGNOSIS — E875 Hyperkalemia: Secondary | ICD-10-CM | POA: Diagnosis present

## 2018-10-09 DIAGNOSIS — E782 Mixed hyperlipidemia: Secondary | ICD-10-CM | POA: Diagnosis present

## 2018-10-09 DIAGNOSIS — Z7982 Long term (current) use of aspirin: Secondary | ICD-10-CM

## 2018-10-09 DIAGNOSIS — N390 Urinary tract infection, site not specified: Secondary | ICD-10-CM | POA: Diagnosis not present

## 2018-10-09 DIAGNOSIS — D509 Iron deficiency anemia, unspecified: Secondary | ICD-10-CM | POA: Diagnosis present

## 2018-10-09 DIAGNOSIS — N179 Acute kidney failure, unspecified: Principal | ICD-10-CM | POA: Diagnosis present

## 2018-10-09 DIAGNOSIS — E785 Hyperlipidemia, unspecified: Secondary | ICD-10-CM | POA: Diagnosis present

## 2018-10-09 DIAGNOSIS — J9621 Acute and chronic respiratory failure with hypoxia: Secondary | ICD-10-CM | POA: Diagnosis not present

## 2018-10-09 DIAGNOSIS — L89312 Pressure ulcer of right buttock, stage 2: Secondary | ICD-10-CM | POA: Diagnosis present

## 2018-10-09 DIAGNOSIS — N492 Inflammatory disorders of scrotum: Secondary | ICD-10-CM | POA: Diagnosis not present

## 2018-10-09 DIAGNOSIS — E1122 Type 2 diabetes mellitus with diabetic chronic kidney disease: Secondary | ICD-10-CM | POA: Diagnosis present

## 2018-10-09 DIAGNOSIS — N184 Chronic kidney disease, stage 4 (severe): Secondary | ICD-10-CM | POA: Diagnosis present

## 2018-10-09 DIAGNOSIS — B372 Candidiasis of skin and nail: Secondary | ICD-10-CM | POA: Diagnosis present

## 2018-10-09 DIAGNOSIS — L89150 Pressure ulcer of sacral region, unstageable: Secondary | ICD-10-CM | POA: Diagnosis present

## 2018-10-09 DIAGNOSIS — I10 Essential (primary) hypertension: Secondary | ICD-10-CM | POA: Diagnosis present

## 2018-10-09 DIAGNOSIS — R464 Slowness and poor responsiveness: Secondary | ICD-10-CM | POA: Diagnosis present

## 2018-10-09 DIAGNOSIS — R0902 Hypoxemia: Secondary | ICD-10-CM

## 2018-10-09 DIAGNOSIS — G9349 Other encephalopathy: Secondary | ICD-10-CM | POA: Diagnosis present

## 2018-10-09 MED ORDER — ACETAMINOPHEN 650 MG RE SUPP: 650.0000 mg | Freq: Four times a day (QID) | RECTAL | Status: DC | PRN

## 2018-10-09 MED ORDER — HEPARIN SODIUM (PORCINE) 5000 UNIT/ML IJ SOLN
5000.0000 [IU] | Freq: Three times a day (TID) | INTRAMUSCULAR | Status: DC
  Administered 2018-10-10 – 2018-10-11 (×4): 5000 [IU] via SUBCUTANEOUS
  Filled 2018-10-09 (×3): qty 1

## 2018-10-09 MED ORDER — SODIUM CHLORIDE 0.9 % IV SOLN: INTRAVENOUS | Status: DC

## 2018-10-09 MED ORDER — ASPIRIN EC 81 MG PO TBEC
81.0000 mg | DELAYED_RELEASE_TABLET | Freq: Every day | ORAL | Status: DC
  Administered 2018-10-10 – 2018-10-14 (×5): 81 mg via ORAL
  Filled 2018-10-09 (×5): qty 1

## 2018-10-09 MED ORDER — FERROUS SULFATE 325 (65 FE) MG PO TABS
325.0000 mg | ORAL_TABLET | Freq: Two times a day (BID) | ORAL | Status: DC
  Administered 2018-10-10 – 2018-10-15 (×9): 325 mg via ORAL
  Filled 2018-10-09 (×10): qty 1

## 2018-10-09 MED ORDER — NYSTATIN 100000 UNIT/GM EX POWD
Freq: Three times a day (TID) | CUTANEOUS | Status: DC
  Administered 2018-10-10 – 2018-10-17 (×22): via TOPICAL
  Filled 2018-10-09 (×2): qty 15

## 2018-10-09 MED ORDER — ESCITALOPRAM OXALATE 10 MG PO TABS
10.0000 mg | ORAL_TABLET | Freq: Every day | ORAL | Status: DC
  Administered 2018-10-10 – 2018-10-15 (×6): 10 mg via ORAL
  Filled 2018-10-09 (×6): qty 1

## 2018-10-09 MED ORDER — ATORVASTATIN CALCIUM 10 MG PO TABS
20.0000 mg | ORAL_TABLET | Freq: Every day | ORAL | Status: DC
  Administered 2018-10-10 – 2018-10-16 (×7): 20 mg via ORAL
  Filled 2018-10-09 (×8): qty 2

## 2018-10-09 MED ORDER — DOXAZOSIN MESYLATE 2 MG PO TABS
4.0000 mg | ORAL_TABLET | Freq: Every day | ORAL | Status: DC
  Administered 2018-10-10 – 2018-10-16 (×7): 4 mg via ORAL
  Filled 2018-10-09 (×7): qty 2

## 2018-10-09 MED ORDER — SODIUM CHLORIDE 0.9 % IV SOLN
1.0000 g | INTRAVENOUS | Status: DC
  Administered 2018-10-10: 1 g via INTRAVENOUS
  Filled 2018-10-09 (×2): qty 10

## 2018-10-09 MED ORDER — BUPROPION HCL ER (XL) 150 MG PO TB24
150.0000 mg | ORAL_TABLET | Freq: Every day | ORAL | Status: DC
  Administered 2018-10-10 – 2018-10-14 (×5): 150 mg via ORAL
  Filled 2018-10-09 (×5): qty 1

## 2018-10-09 MED ORDER — ACETAMINOPHEN 325 MG PO TABS
650.0000 mg | ORAL_TABLET | Freq: Four times a day (QID) | ORAL | Status: DC | PRN
  Administered 2018-10-15: 650 mg via ORAL
  Filled 2018-10-09: qty 2

## 2018-10-09 MED ORDER — ONDANSETRON HCL 4 MG/2ML IJ SOLN
4.0000 mg | Freq: Four times a day (QID) | INTRAMUSCULAR | Status: DC | PRN
  Filled 2018-10-09: qty 2

## 2018-10-09 MED ORDER — AMLODIPINE BESYLATE 5 MG PO TABS
5.0000 mg | ORAL_TABLET | Freq: Every day | ORAL | Status: DC
  Administered 2018-10-10: 5 mg via ORAL
  Filled 2018-10-09: qty 1

## 2018-10-09 MED ORDER — SODIUM CHLORIDE 0.9% FLUSH
3.0000 mL | Freq: Two times a day (BID) | INTRAVENOUS | Status: DC
  Administered 2018-10-10 – 2018-10-16 (×12): 3 mL via INTRAVENOUS

## 2018-10-09 MED ORDER — ADULT MULTIVITAMIN W/MINERALS CH
1.0000 | ORAL_TABLET | Freq: Every day | ORAL | Status: DC
  Administered 2018-10-10 – 2018-10-16 (×7): 1 via ORAL
  Filled 2018-10-09 (×7): qty 1

## 2018-10-09 MED ORDER — ONDANSETRON HCL 4 MG PO TABS: 4.0000 mg | ORAL_TABLET | Freq: Four times a day (QID) | ORAL | Status: DC | PRN

## 2018-10-09 MED ORDER — BISOPROLOL FUMARATE 5 MG PO TABS
5.0000 mg | ORAL_TABLET | Freq: Every day | ORAL | Status: DC
  Administered 2018-10-10 – 2018-10-16 (×7): 5 mg via ORAL
  Filled 2018-10-09 (×7): qty 1

## 2018-10-09 NOTE — Telephone Encounter (Signed)
Called patient's daughter to inform her that patient need to stop potassium due to recent lab work per Dr. Agustin Cree, spoke with Gerilyn Nestle home health nurse she reports she will inform patient to stop potasium. She is trying to get to the patient to go to the hospital as his white blood cell count is 19 and his skin is sloughing off because he isn't getting up to go to the bathroom. Dr. Arn Medal aware.

## 2018-10-10 ENCOUNTER — Encounter (HOSPITAL_COMMUNITY): Payer: Self-pay | Admitting: *Deleted

## 2018-10-10 ENCOUNTER — Inpatient Hospital Stay (HOSPITAL_COMMUNITY): Payer: Medicare HMO

## 2018-10-10 ENCOUNTER — Other Ambulatory Visit: Payer: Self-pay

## 2018-10-10 ENCOUNTER — Other Ambulatory Visit (HOSPITAL_COMMUNITY): Payer: Medicare HMO

## 2018-10-10 DIAGNOSIS — I10 Essential (primary) hypertension: Secondary | ICD-10-CM

## 2018-10-10 DIAGNOSIS — N492 Inflammatory disorders of scrotum: Secondary | ICD-10-CM | POA: Diagnosis present

## 2018-10-10 DIAGNOSIS — D649 Anemia, unspecified: Secondary | ICD-10-CM | POA: Diagnosis present

## 2018-10-10 DIAGNOSIS — N179 Acute kidney failure, unspecified: Principal | ICD-10-CM

## 2018-10-10 DIAGNOSIS — E119 Type 2 diabetes mellitus without complications: Secondary | ICD-10-CM

## 2018-10-10 DIAGNOSIS — N184 Chronic kidney disease, stage 4 (severe): Secondary | ICD-10-CM

## 2018-10-10 DIAGNOSIS — G9341 Metabolic encephalopathy: Secondary | ICD-10-CM

## 2018-10-10 DIAGNOSIS — I350 Nonrheumatic aortic (valve) stenosis: Secondary | ICD-10-CM

## 2018-10-10 DIAGNOSIS — J189 Pneumonia, unspecified organism: Secondary | ICD-10-CM | POA: Diagnosis present

## 2018-10-10 DIAGNOSIS — N19 Unspecified kidney failure: Secondary | ICD-10-CM

## 2018-10-10 LAB — IRON AND TIBC
Iron: 16 ug/dL — ABNORMAL LOW (ref 45–182)
Saturation Ratios: 10 % — ABNORMAL LOW (ref 17.9–39.5)
TIBC: 161 ug/dL — ABNORMAL LOW (ref 250–450)
UIBC: 145 ug/dL

## 2018-10-10 LAB — URINALYSIS, ROUTINE W REFLEX MICROSCOPIC
Bilirubin Urine: NEGATIVE
Glucose, UA: NEGATIVE mg/dL
Ketones, ur: NEGATIVE mg/dL
Nitrite: NEGATIVE
Protein, ur: 30 mg/dL — AB
Specific Gravity, Urine: 1.009 (ref 1.005–1.030)
WBC, UA: >50 WBC/hpf — ABNORMAL HIGH (ref 0–5)
pH: 5 (ref 5.0–8.0)

## 2018-10-10 LAB — BASIC METABOLIC PANEL
Anion gap: 13 (ref 5–15)
Anion gap: 14 (ref 5–15)
BUN: 138 mg/dL — ABNORMAL HIGH (ref 8–23)
BUN: 132 mg/dL — ABNORMAL HIGH (ref 8–23)
CO2: 16 mmol/L — ABNORMAL LOW (ref 22–32)
CO2: 20 mmol/L — ABNORMAL LOW (ref 22–32)
Calcium: 8.7 mg/dL — ABNORMAL LOW (ref 8.9–10.3)
Calcium: 8.5 mg/dL — ABNORMAL LOW (ref 8.9–10.3)
Chloride: 107 mmol/L (ref 98–111)
Chloride: 104 mmol/L (ref 98–111)
Creatinine, Ser: 3.78 mg/dL — ABNORMAL HIGH (ref 0.61–1.24)
Creatinine, Ser: 3.49 mg/dL — ABNORMAL HIGH (ref 0.61–1.24)
GFR calc Af Amer: 16 mL/min — ABNORMAL LOW (ref >60)
GFR calc Af Amer: 18 mL/min — ABNORMAL LOW (ref >60)
GFR calc non Af Amer: 14 mL/min — ABNORMAL LOW (ref >60)
GFR calc non Af Amer: 16 mL/min — ABNORMAL LOW (ref >60)
Glucose, Bld: 147 mg/dL — ABNORMAL HIGH (ref 70–99)
Glucose, Bld: 137 mg/dL — ABNORMAL HIGH (ref 70–99)
Potassium: 5.1 mmol/L (ref 3.5–5.1)
Potassium: 4.6 mmol/L (ref 3.5–5.1)
Sodium: 136 mmol/L (ref 135–145)
Sodium: 138 mmol/L (ref 135–145)

## 2018-10-10 LAB — SODIUM, URINE, RANDOM: Sodium, Ur: 56 mmol/L

## 2018-10-10 LAB — CBC
HCT: 27.2 % — ABNORMAL LOW (ref 39.0–52.0)
Hemoglobin: 8.2 g/dL — ABNORMAL LOW (ref 13.0–17.0)
MCH: 26 pg (ref 26.0–34.0)
MCHC: 30.1 g/dL (ref 30.0–36.0)
MCV: 86.3 fL (ref 80.0–100.0)
Platelets: 234 10*3/uL (ref 150–400)
RBC: 3.15 MIL/uL — ABNORMAL LOW (ref 4.22–5.81)
RDW: 18.5 % — ABNORMAL HIGH (ref 11.5–15.5)
WBC: 20.2 10*3/uL — ABNORMAL HIGH (ref 4.0–10.5)
nRBC: 0 % (ref 0.0–0.2)

## 2018-10-10 LAB — HEPATIC FUNCTION PANEL
ALT: 24 U/L (ref 0–44)
AST: 41 U/L (ref 15–41)
Albumin: 2.3 g/dL — ABNORMAL LOW (ref 3.5–5.0)
Alkaline Phosphatase: 77 U/L (ref 38–126)
Bilirubin, Direct: 0.3 mg/dL — ABNORMAL HIGH (ref 0.0–0.2)
Indirect Bilirubin: 0.5 mg/dL (ref 0.3–0.9)
Total Bilirubin: 0.8 mg/dL (ref 0.3–1.2)
Total Protein: 5.5 g/dL — ABNORMAL LOW (ref 6.5–8.1)

## 2018-10-10 LAB — POTASSIUM
Potassium: 4.7 mmol/L (ref 3.5–5.1)
Potassium: 4.4 mmol/L (ref 3.5–5.1)

## 2018-10-10 LAB — FERRITIN: Ferritin: 491 ng/mL — ABNORMAL HIGH (ref 24–336)

## 2018-10-10 LAB — FOLATE: Folate: 26.7 ng/mL (ref >5.9)

## 2018-10-10 LAB — MRSA PCR SCREENING: MRSA by PCR: POSITIVE — AB

## 2018-10-10 LAB — LACTATE DEHYDROGENASE: LDH: 192 U/L (ref 98–192)

## 2018-10-10 LAB — VITAMIN B12: Vitamin B-12: 568 pg/mL (ref 180–914)

## 2018-10-10 LAB — CREATININE, URINE, RANDOM: Creatinine, Urine: 39.85 mg/dL

## 2018-10-10 MED ORDER — GUAIFENESIN ER 600 MG PO TB12
1200.0000 mg | ORAL_TABLET | Freq: Two times a day (BID) | ORAL | Status: DC
  Administered 2018-10-10 – 2018-10-14 (×9): 1200 mg via ORAL
  Filled 2018-10-10 (×9): qty 2

## 2018-10-10 MED ORDER — CHLORHEXIDINE GLUCONATE CLOTH 2 % EX PADS
6.0000 | MEDICATED_PAD | Freq: Every day | CUTANEOUS | Status: AC
  Administered 2018-10-10 – 2018-10-14 (×5): 6 via TOPICAL

## 2018-10-10 MED ORDER — MUPIROCIN 2 % EX OINT
1.0000 "application " | TOPICAL_OINTMENT | Freq: Two times a day (BID) | CUTANEOUS | Status: AC
  Administered 2018-10-10 – 2018-10-14 (×10): 1 via NASAL
  Filled 2018-10-10 (×2): qty 22

## 2018-10-10 MED ORDER — PANTOPRAZOLE SODIUM 40 MG PO TBEC
40.0000 mg | DELAYED_RELEASE_TABLET | Freq: Every day | ORAL | Status: DC
  Administered 2018-10-10 – 2018-10-14 (×5): 40 mg via ORAL
  Filled 2018-10-10 (×4): qty 1

## 2018-10-10 MED ORDER — STERILE WATER FOR INJECTION IV SOLN
INTRAVENOUS | Status: DC
  Administered 2018-10-10 – 2018-10-11 (×2): via INTRAVENOUS
  Filled 2018-10-10 (×7): qty 850

## 2018-10-10 MED ORDER — SODIUM CHLORIDE 0.9 % IV SOLN: INTRAVENOUS | Status: DC

## 2018-10-10 MED ORDER — GERHARDT'S BUTT CREAM
TOPICAL_CREAM | Freq: Four times a day (QID) | CUTANEOUS | Status: DC
  Administered 2018-10-10 – 2018-10-14 (×11): via TOPICAL
  Filled 2018-10-10 (×3): qty 1

## 2018-10-10 MED ORDER — BISACODYL 10 MG RE SUPP
10.0000 mg | Freq: Once | RECTAL | Status: DC
  Filled 2018-10-10: qty 1

## 2018-10-10 MED ORDER — HYDROMORPHONE HCL 1 MG/ML IJ SOLN
0.5000 mg | INTRAMUSCULAR | Status: DC | PRN
  Administered 2018-10-10 – 2018-10-14 (×13): 0.5 mg via INTRAVENOUS
  Filled 2018-10-10 (×14): qty 1

## 2018-10-10 MED ORDER — LIDOCAINE HCL 1 % IJ SOLN
INTRAMUSCULAR | Status: AC
  Filled 2018-10-10: qty 20

## 2018-10-10 MED ORDER — LORATADINE 10 MG PO TABS
10.0000 mg | ORAL_TABLET | Freq: Every day | ORAL | Status: DC
  Administered 2018-10-10 – 2018-10-14 (×5): 10 mg via ORAL
  Filled 2018-10-10 (×4): qty 1

## 2018-10-10 MED ORDER — CHLORHEXIDINE GLUCONATE 0.12 % MT SOLN
15.0000 mL | Freq: Two times a day (BID) | OROMUCOSAL | Status: DC
  Administered 2018-10-10 – 2018-10-17 (×14): 15 mL via OROMUCOSAL
  Filled 2018-10-10 (×13): qty 15

## 2018-10-10 MED ORDER — VANCOMYCIN HCL 10 G IV SOLR
2000.0000 mg | Freq: Once | INTRAVENOUS | Status: AC
  Administered 2018-10-10: 2000 mg via INTRAVENOUS
  Filled 2018-10-10: qty 2000

## 2018-10-10 MED ORDER — ORAL CARE MOUTH RINSE
15.0000 mL | Freq: Two times a day (BID) | OROMUCOSAL | Status: DC
  Administered 2018-10-11 – 2018-10-16 (×3): 15 mL via OROMUCOSAL

## 2018-10-10 MED ORDER — FLUTICASONE PROPIONATE 50 MCG/ACT NA SUSP
2.0000 | Freq: Every day | NASAL | Status: DC
  Administered 2018-10-10 – 2018-10-17 (×7): 2 via NASAL
  Filled 2018-10-10: qty 16

## 2018-10-10 MED ORDER — SODIUM CHLORIDE 0.9 % IV SOLN
2.0000 g | INTRAVENOUS | Status: DC
  Administered 2018-10-10 – 2018-10-16 (×7): 2 g via INTRAVENOUS
  Filled 2018-10-10 (×8): qty 2

## 2018-10-10 NOTE — Progress Notes (Signed)
Pt now requiring O2 via [place] at 5 L was on 3, Dr Grandville Silos notified at 1804. With call back. MD to place new orders.

## 2018-10-10 NOTE — Consult Note (Signed)
[place] Nurse wound consult note Patient receiving care in [facility] [id number].  I spoke with Earleen Reaper, RN, WTA as she took care of the patient last night.  This consults was completed remotely.   According to [facility], the patient has the following skin concerns: The abdominal folds, scrotum, penis, inner upper thighs (anterior and posterior), bilateral groin areas are bright red, have scattered areas of tissue sloughing where the skin folds lie against one another, and some blackened areas especially on the posterior thigh/buttock folds.  She further explained that the CT that was ordered and performed was in concern of possible fournier's gangrene, which did not seem found according to the CT results.  The areas are heavily by both moisture, friction, shear, and fungus. Reason for Consult:"decubitus" Wound type: Highly likely this may represent extensive MASD Intertriginous dermatitis Pressure Injury POA: Yes/No/NA I have placed a Secure Chat message to Dr. Irine Seal and requested photos of the areas be placed in the record.  I will plan for a WOC nurse to view the patient's wounds 10/11/18. Dressing procedure/placement/frequency: Gerhardt's butt cream to all affected sites 4 times daily. A SizeWise bed, and Use ONLY ONE DermaTherapy pad beneath patient's buttocks. Do NOT use disposable pads.  Monitor the wound area(s) for worsening of condition such as: Signs/symptoms of infection,  Increase in size,  Development of or worsening of odor, Development of pain, or increased pain at the affected locations.  Notify the medical team if any of these develop.  Thank you for the consult.  Discussed plan of care with the bedside nurse.  Val Riles, RN, MSN, CWOCN, CNS-BC, pager [phone]

## 2018-10-10 NOTE — Progress Notes (Signed)
Pharmacy Antibiotic Note  [name] is a 84 y.o. male admitted on 10/09/2018 with pneumonia.  Pharmacy has been consulted for vancomycin dosing.  CKD stage IV. BL may be 2.7? Current SCr is 3.78  Plan: Give vancomycin 2g IV x 1, then start vancomycin 1g IV Q48h Start cefepime 2g IV Q24h Monitor clinical picture, renal function, vanc levels prn F/U C&S, abx deescalation / LOT  Height: 6' (182.9 cm) Weight: 236 lb (107 kg) IBW/kg (Calculated) : 77.6  Temp (24hrs), Avg:98.1 F (36.7 C), Min:97.9 F (36.6 C), Max:98.2 F (36.8 C)  Recent Labs  Lab 10/10/18 0014  WBC 20.2*  CREATININE 3.78*    Estimated Creatinine Clearance: 19.4 mL/min (A) (by C-G formula based on SCr of 3.78 mg/dL (H)).    No Known Allergies  Thank you for allowing pharmacy to be a part of this patient's care.  Reginia Naas 10/10/2018 8:19 AM

## 2018-10-10 NOTE — H&P (Signed)
History and Physical    Billy [name] YFV:[id number] DOB: Aug 23, 1934 DOA: 10/09/2018  PCP: Patient, No Pcp Per  Patient coming from: Home  I have personally briefly reviewed patient's old medical records in [place]  Chief Complaint: Abnormal lab  HPI: Billy [name] is a 84 y.o. male with medical history significant of CKD stage 4, HTN, DM that appears diet controlled.  Late last month he was admitted for CHF due to severe AS, diuresed, noted to have somewhat worsening renal function with diuresis.  Cardiology didn't feel he would be a good candidate for intervention regarding his AS.  Nephrology likewise didn't think he would be a candidate at all for long term dialysis.  Pal care spoke with patient, he apparently made it clear he wasn't interested in long term dialysis or heroic measures as their 3/21 note discusses.  He was discharged home with Palliative / Hospice care.  Seen by cardiology for video office visit (due to current [place] pandemic), on 4/10.  Cardiologist noted that he was quite "sluggish", and noted "I am worried about potentially worsening of his kidney function with quite aggressive diuresis."  Chem 7 was ordered, obtained today.  And showed BUN in the 140s with creat of 3.5.  Patient sent in to ED.   ED Course: BUN 140, creat 3.5.  Bicarb 18.  K 5.7 (temp measures given, no EKG changes).  Patient with AMS.  Patient transferred to [facility].   Review of Systems: Unable to perform due to AMS  Past Medical History:  Diagnosis Date  . CKD (chronic kidney disease), stage IV ([place])   . Diabetes mellitus without complication ([place])   . Hypertension   . Mixed hyperlipidemia   . Morbid obesity ([place])   . Severe aortic stenosis    a. 09/09/2018 [facility]): EF 55-60%, mod conc LVH. Nl RV fxn. Sev dil RA w/ elevated LA pressure. Mod RAE. Sev AS w/ peak grad of 24mmHg, mean of 27mmHg. AoV area 0.7cm. Mod TR. RVSP 91mmHg.    Past Surgical History:  Procedure Laterality  Date  . NO PAST SURGERIES       reports that he has quit smoking. His smokeless tobacco use includes chew. He reports previous alcohol use. He reports that he does not use drugs.  No Known Allergies  No family history on file. Patient unable to provide due to AMS  Prior to Admission medications   Medication Sig Start Date End Date Taking? Authorizing Provider  amLODipine (NORVASC) 5 MG tablet Take 1 tablet (5 mg total) by mouth daily. 09/17/18   Darreld Mclean, PA-C  aspirin EC 81 MG tablet Take 81 mg by mouth daily.    [provider]  atorvastatin (LIPITOR) 20 MG tablet Take 1 tablet (20 mg total) by mouth daily. 01/12/14   Elayne Snare, MD  bisoprolol (ZEBETA) 5 MG tablet Take 1 tablet (5 mg total) by mouth daily. 09/17/18   Darreld Mclean, PA-C  buPROPion (WELLBUTRIN XL) 150 MG 24 hr tablet Take 1 tablet (150 mg total) by mouth daily. 01/08/14   Elayne Snare, MD  doxazosin (CARDURA) 4 MG tablet TAKE ONE TABLET BY MOUTH ONCE DAILY 04/15/14   Elayne Snare, MD  escitalopram (LEXAPRO) 10 MG tablet TAKE ONE TABLET BY MOUTH ONCE DAILY 01/08/14   Elayne Snare, MD  ferrous sulfate 325 (65 FE) MG tablet Take 1 tablet (325 mg total) by mouth 2 (two) times daily with a meal. 09/17/18   Darreld Mclean, PA-C  furosemide (LASIX) 80 MG tablet Take 2 tablets (160 mg total) by mouth 2 (two) times daily. 09/17/18   Sande Rives E, PA-C  glucose blood (ACCU-CHEK AVIVA) test strip Use as instructed to check blood sugar 1 time per day dx code 250.00 01/08/14   Elayne Snare, MD  Multiple Vitamin (MULTIVITAMIN WITH MINERALS) TABS tablet Take 1 tablet by mouth daily.    [provider]  Multiple Vitamin (MULTIVITAMIN) capsule Take 1 capsule by mouth daily.    [provider]  nystatin cream (MYCOSTATIN) Apply topically 2 (two) times daily. Apply 2 times daily to red, moist area in bilateral groin/ingluinal folds until healing is complete. 09/17/18   Sande Rives E, PA-C   potassium chloride SA (K-DUR,KLOR-CON) 20 MEQ tablet Take 1 tablet (20 mEq total) by mouth 2 (two) times daily. 09/17/18   Darreld Mclean, PA-C    Physical Exam: Vitals:   10/09/18 2300  BP: (!) 136/115  Pulse: 81  Resp: (!) 22  Temp: 98.2 F (36.8 C)  SpO2: 100%  Weight: 107 kg    Constitutional: NAD, calm, comfortable Eyes: PERRL, lids and conjunctivae normal ENMT: Mucous membranes are moist. Posterior pharynx clear of any exudate or lesions.Normal dentition.  Neck: normal, supple, no masses, no thyromegaly Respiratory: clear to auscultation bilaterally, tachypnic Cardiovascular: SEM  Abdomen: no tenderness, no masses palpated. No hepatosplenomegaly. Bowel sounds positive.  Musculoskeletal: no clubbing / cyanosis. No joint deformity upper and lower extremities. Good ROM, no contractures. Normal muscle tone.  Skin: stage 2 decubitus on buttocks, cellulitis and skin sloughing on scrotum.  Candidiasis in groin. Neurologic: Patient encephalopathic, not really able to give a great history.  Answers yes/no questions and then rapidly falls back asleep. Psychiatric: Normal judgment and insight. Alert and oriented x 3. Normal mood.    Labs on Admission: I have personally reviewed following labs and imaging studies  CBC: No results for input(s): WBC, NEUTROABS, HGB, HCT, MCV, PLT in the last 168 hours. Basic Metabolic Panel: No results for input(s): NA, K, CL, CO2, GLUCOSE, BUN, CREATININE, CALCIUM, MG, PHOS in the last 168 hours. GFR: CrCl cannot be calculated (Patient's most recent lab result is older than the maximum 21 days allowed.). Liver Function Tests: No results for input(s): AST, ALT, ALKPHOS, BILITOT, PROT, ALBUMIN in the last 168 hours. No results for input(s): LIPASE, AMYLASE in the last 168 hours. No results for input(s): AMMONIA in the last 168 hours. Coagulation Profile: No results for input(s): INR, PROTIME in the last 168 hours. Cardiac Enzymes: No results for  input(s): CKTOTAL, CKMB, CKMBINDEX, TROPONINI in the last 168 hours. BNP (last 3 results) No results for input(s): PROBNP in the last 8760 hours. HbA1C: No results for input(s): HGBA1C in the last 72 hours. CBG: No results for input(s): GLUCAP in the last 168 hours. Lipid Profile: No results for input(s): CHOL, HDL, LDLCALC, TRIG, CHOLHDL, LDLDIRECT in the last 72 hours. Thyroid Function Tests: No results for input(s): TSH, T4TOTAL, FREET4, T3FREE, THYROIDAB in the last 72 hours. Anemia Panel: No results for input(s): VITAMINB12, FOLATE, FERRITIN, TIBC, IRON, RETICCTPCT in the last 72 hours. Urine analysis:    Component Value Date/Time   COLORURINE YELLOW 09/12/2018 1745   APPEARANCEUR HAZY (A) 09/12/2018 1745   LABSPEC 1.011 09/12/2018 1745   PHURINE 5.0 09/12/2018 [place] 09/12/2018 1745   GLUCOSEU NEGATIVE 07/22/2013 [place] 09/12/2018 1745   [place] 09/12/2018 1745   [place] 09/12/2018 1745  PROTEINUR NEGATIVE 09/12/2018 1745   UROBILINOGEN 0.2 07/22/2013 1148   NITRITE NEGATIVE 09/12/2018 1745   LEUKOCYTESUR MODERATE (A) 09/12/2018 1745    Radiological Exams on Admission: No results found.  EKG: Independently reviewed.  Assessment/Plan Principal Problem:   Acute renal failure superimposed on stage 4 chronic kidney disease (HCC) Active Problems:   Type 2 diabetes mellitus (HCC)   Essential hypertension, benign   Severe aortic stenosis   Uremic encephalopathy   Cellulitis of scrotum    1. AKF on CKD stage 4 - 1. Suspect dehydration primarily given the aggressive diuresis he has been on recently.  Note that Cards was concerned about this possibility, as well as patient lethargy, when they did video visit x5 days ago (see note). 2. Patient not a candidate for, and has expressed that he does not want, dialysis (See pal care notes and nephro notes from last admit). 3. Tele monitor 4. Check K Q4H 1. Got  temporarizing measures in ED earlier this evening 5. Trying gentle hydration: 1. Hold lasix 2. 1L NS in [facility] ED 3. Bicarb gtt at 125 cc/hr 6. Repeat BMP now and again at noon 7. Urine lytes 8. CT abd / pelvis to r/o obstruction 9. Foley catheter 2. Acute encephalopathy - 1. Likely uremic encephalopathy 2. Morphine given in ED may also be playing a role 3. Severe AS - 1. Watch for fluid overload with re-hydration 2. Not candidate for intervention (see cards notes from last admit) 4. UTI - 1. Rocephin empirically 2. UCx and BCx pending 5. Cellulitis of scrotum - 1. Rocephin empirically 2. MRSA PCR pending 3. Nystatin as well for Candida 4. Note that he was having candidal rash issues as early as last admit. 5. WBC 21k, but otherwise no SIRS 6. CT abd/pelvis to r/o fourniers 1. But seems quite stable for this from a vital sign standpoint 2. And I dont think that hed make it through surgery and subsequent ICU stay anyhow.  Nor do I think, based on the pal care notes from last admit, that he would want something like this. 7. Dilaudid PRN pain if needed (though quite sleepy at the moment) 8. Wound care consult for decubitus 6. HTN - continue home BP meds (except diuretics)  DVT prophylaxis: Heparin [place] Code Status: DNR/DNI - See goals of care as discussed in pal care notes from last admit Family Communication: Couldn't get a-hold of either daughter on phone Disposition Plan: TBD Consults called: Pal care consult put into EPIC Admission status: Admit to inpatient  Severity of Illness: The appropriate patient status for this patient is INPATIENT. Inpatient status is judged to be reasonable and necessary in order to provide the required intensity of service to ensure the patient's safety. The patient's presenting symptoms, physical exam findings, and initial radiographic and laboratory data in the context of their chronic comorbidities is felt to place them at high risk for further  clinical deterioration. Furthermore, it is not anticipated that the patient will be medically stable for discharge from the hospital within 2 midnights of admission. The following factors support the patient status of inpatient.   " The patient's presenting symptoms include Generalized weakness, inability to ambulate. " The worrisome physical exam findings include AMS, lethargy. " The initial radiographic and laboratory data are worrisome because of AKI on CKD stage 4, K of 5.7, cellulitis of scrotum and buttocks. " The chronic co-morbidities include CKD stage 4, severe AS.   * I certify that at the point of admission  it is my clinical judgment that the patient will require inpatient hospital care spanning beyond 2 midnights from the point of admission due to high intensity of service, high risk for further deterioration and high frequency of surveillance required.*    Juliana Boling M. DO [facility]  How to contact the [facility] Attending or Consulting provider [place] or covering provider during after hours [place], for this patient?  1. Check the care team in [facility] and look for a) attending/consulting TR[facility] provider listed and b) the [facility] team listed 2. Log into [email or web address]  Amion Physician Scheduling and messaging for groups and whole hospitals  On call and physician scheduling software for group practices, residents, hospitalists and other medical providers for call, clinic, rotation and shift schedules. OnCall [employer] is a hospital-wide system for scheduling doctors and paging doctors on call. EasyPlot is for scientific plotting and data analysis.  [email or web address]  and use [place]'s universal password to access. If you do not have the password, please contact the hospital operator.  3. Locate the [facility] provider you are looking for under [facility] and page to a number that you can be directly reached. 4. If you still have difficulty reaching the provider, please page the [facility]  (Director on Call) for the Hospitalists listed on amion for assistance.  10/10/2018, 12:27 AM

## 2018-10-10 NOTE — Progress Notes (Signed)
Patient's daughter Almyra Free called for update and gave consent for thoracentesis over the phone.

## 2018-10-10 NOTE — Evaluation (Signed)
Clinical/Bedside Swallow Evaluation Patient Details  Name: Billy [name] MRN: [id number] Date of Birth: 08-02-1934  Today's Date: 10/10/2018 Time: SLP Start Time (ACUTE ONLY): 7989 SLP Stop Time (ACUTE ONLY): 0857 SLP Time Calculation (min) (ACUTE ONLY): 18 min  Past Medical History:  Past Medical History:  Diagnosis Date  . CKD (chronic kidney disease), stage IV ([place])   . Diabetes mellitus without complication ([place])   . Hypertension   . Mixed hyperlipidemia   . Morbid obesity ([place])   . Severe aortic stenosis    a. 09/09/2018 [facility]): EF 55-60%, mod conc LVH. Nl RV fxn. Sev dil RA w/ elevated LA pressure. Mod RAE. Sev AS w/ peak grad of 14mmHg, mean of 32mmHg. AoV area 0.7cm. Mod TR. RVSP 47mmHg.   Past Surgical History:  Past Surgical History:  Procedure Laterality Date  . NO PAST SURGERIES     HPI:  Billy [name] is a 84 y.o. male with medical history significant of CKD stage 4, HTN, DM, HTN, CHF. Per chart, pt not a candidate for dialysis and Palliative care spoke with patient, he apparently made it clear he wasn't interested in long term dialysis or heroic measures as their 3/21 note discusses and discharged home with Palliative / Hospice care. CT abdomen noted right lower lobe airspace disease, moderate pleural effucion. Found to have AKF on CKD stage 4. NO prior ST documentation located.    Assessment / Plan / Recommendation Clinical Impression  Pt presents with risk factors for aspiration including increased respiratory effort at baseline, xerostomia and lingual/palatal coating. Able to consume approximately one oz intervals of water before apenic period ceases. Mild-moderate increased work of breathing and encouraged to rest in between sips. No cough, throat clear and vocal quality clear. Pt does not have dentures and mastication was prolonged and somewhat effortful therefore recommend Dys 3 texture, thin liquids, pills with water and check oral cavity for potential food  residue. ST will follow up.     SLP Visit Diagnosis: Dysphagia, oral phase (R13.11)    Aspiration Risk  Moderate aspiration risk    Diet Recommendation Dysphagia 3 (Mech soft);Thin liquid   Liquid Administration via: Straw;Cup Medication Administration: Whole meds with liquid Supervision: Patient able to self feed;Intermittent supervision to cue for compensatory strategies Compensations: Slow rate;Minimize environmental distractions;Small sips/bites;Lingual sweep for clearance of pocketing;Other (Comment)(rest breaks) Postural Changes: Seated upright at 90 degrees    Other  Recommendations Oral Care Recommendations: Oral care BID   Follow up Recommendations 24 hour supervision/assistance      Frequency and Duration min 2x/week  2 weeks       Prognosis Prognosis for Safe Diet Advancement: Good Barriers to Reach Goals: Cognitive deficits      Swallow Study   General HPI: Billy [name] is a 84 y.o. male with medical history significant of CKD stage 4, HTN, DM, HTN, CHF. Per chart, pt not a candidate for dialysis and Palliative care spoke with patient, he apparently made it clear he wasn't interested in long term dialysis or heroic measures as their 3/21 note discusses and discharged home with Palliative / Hospice care. CT abdomen noted right lower lobe airspace disease, moderate pleural effucion. Found to have AKF on CKD stage 4. NO prior ST documentation located.  Type of Study: Bedside Swallow Evaluation Previous Swallow Assessment: (none) Diet Prior to this Study: NPO Temperature Spikes Noted: No Respiratory Status: Nasal cannula History of Recent Intubation: No Behavior/Cognition: Alert;Cooperative;Pleasant mood;Requires cueing Oral Cavity Assessment: Dry;Other (comment)(lingual discoloration ?("hairy tongue")) Oral  Care Completed by SLP: Yes Oral Cavity - Dentition: Edentulous Vision: Functional for self-feeding Self-Feeding Abilities: Needs assist;Needs set up Patient  Positioning: Upright in bed Baseline Vocal Quality: Normal Volitional Cough: Strong Volitional Swallow: Able to elicit    Oral/Motor/Sensory Function Overall Oral Motor/Sensory Function: Within functional limits   Ice Chips Ice chips: Not tested   Thin Liquid Thin Liquid: Impaired Presentation: Cup;Straw Pharyngeal  Phase Impairments: Change in Vital Signs;Other (comments)(increased work of breathing)    Nectar Thick Nectar Thick Liquid: Not tested   Honey Thick Honey Thick Liquid: Not tested   Puree Puree: Within functional limits   Solid     Solid: Impaired Oral Phase Impairments: Impaired mastication Oral Phase Functional Implications: Oral residue      Houston Siren 10/10/2018,9:21 AM [name] [name].Ed Risk analyst [phone] Office [phone]

## 2018-10-10 NOTE — Progress Notes (Signed)
Palliative:  Patient sleeping soundly and with some underlying confusion per unit staff. I attempted to call both daughters at numbers provided on facesheet but no answer and no options to leave a voicemail. I will continue to try and reach to continue [place] conversation. Per past palliative conversations he clearly desired DNR and NO dialysis. Sounds as though they would be open to hospice and he expressed acceptance that he has had a good life and prepared for EOL when his time came.   No charge  Vinie Sill, NP Palliative Medicine Team Pager # [phone] (M-F 8a-5p) Team Phone # [phone] (Nights/Weekends)

## 2018-10-10 NOTE — Progress Notes (Addendum)
**Billy [name]** PROGRESS Billy [name]    Billy Billy [name]  ENI:[id number] DOB: [birth year] DOA: 10/09/2018 PCP: Patient, No Pcp Per   Brief Narrative:  HPI per Dr. Jamesetta Billy [name] Billy Billy [name] is Billy 84 y.o. Billy [name] with medical history significant of CKD stage 4, HTN, DM that appears diet controlled.  Late last month he was admitted for CHF due to severe AS, diuresed, noted to have somewhat worsening renal function with diuresis.  Cardiology didn't feel he would be Billy good candidate for intervention regarding his AS.  Nephrology likewise didn't think he would be Billy candidate at all for long term dialysis.  Pal care spoke with patient, he apparently made it clear he wasn't interested in long term dialysis or heroic measures as their 3/21 Billy [name] discusses.  He was discharged home with Palliative / Hospice care.  Seen by cardiology for video office visit (due to current [place] pandemic), on 4/10.  Cardiologist noted that he was quite "sluggish", and noted "I am worried about potentially worsening of his kidney function with quite aggressive diuresis."  Chem 7 was ordered, obtained today.  And showed BUN in the 140s with creat of 3.5.  Patient sent in to ED.   ED Course: BUN 140, creat 3.5.  Bicarb 18.  K 5.7 (temp measures given, no EKG changes).  Patient with AMS.  Patient transferred to [facility].  Assessment & Plan:   Principal Problem:   Acute renal failure superimposed on stage 4 chronic kidney disease (HCC) Active Problems:   Type 2 diabetes mellitus (HCC)   Hyperlipidemia   Essential hypertension, benign   Severe aortic stenosis   Hypertension   Uremic encephalopathy   Cellulitis of scrotum   HCAP (healthcare-associated pneumonia)   Anemia  1 acute renal failure on chronic kidney disease stage IV Likely secondary to prerenal azotemia from aggressive diuresis.  It was noted from prior hospitalization that cardiology was concerned about this possibility as well as lethargy.  Patient did have Billy video visit 5 days prior to  admission.  Patient noted not to be Billy candidate and does not want dialysis per palliative care notes and nephrology notes from last admission.  Hyperkalemia improved.  Continue hydration with IV fluids and monitor closely for volume overload.  CT abdomen and pelvis negative for hydronephrosis.  Foley catheter in place.  Diuretics on hold.  Continue bicarb drip.  Follow.  2.  Acute metabolic encephalopathy likely secondary to uremic encephalopathy Patient noted to have asterixis on examination.  Patient confused.  Patient on admission noted to have BUN of 138 with Billy creatinine of 3.78.  Continue gentle hydration with IV fluids.  Follow.  3.  HCAP/probable parapneumonic effusion CT abdomen and pelvis notes Billy right lower lobe pneumonia as well as Billy pleural effusion.  Check Billy urine strep pneumococcus antigen.  Check Billy urine Legionella antigen.  Check blood cultures x2.  Patient with Billy leukocytosis with Billy white count of 20.2.  IR for ultrasound-guided thoracentesis.  Discontinue IV Rocephin and placed empirically on IV cefepime and IV vancomycin as patient with recent hospitalization in the last month.  Placed on Mucinex.  SLP evaluation.  4.  Iron deficiency anemia/anemia of chronic disease Patient with no overt bleeding.  Follow H&H.  Transfusion threshold hemoglobin less than 7.  Continue oral iron supplementation.  5.  Severe AS Patient deemed not Billy candidate for intervention per cardiologist last Billy [name] from prior admission.  Monitor closely for volume overload with hydration.  Follow.  6.  UTI Urine cultures and blood  cultures pending.  Discontinue IV Rocephin.  Patient placed on IV cefepime due to concerns for healthcare associated pneumonia and parapneumonic effusion.  7.  Cellulitis of the scrotum/Candida in the scrotum Patient noted to have Billy candidal rash issues during last admission.  Patient with Billy leukocytosis with Billy white count of 20.2.  MRSA PCR positive.  CT abdomen and pelvis with no  indication of Fournier's.  Change IV Rocephin to IV vancomycin IV cefepime.  Wound care consulted.  Continue nystatin topical powder.  Doubt if patient will be able to make it through surgery and subsequent ICU stay if indeed he has Billy Fournier's gangrene.  8.  Hypertension Diuretics on hold.  Continue current regimen of antihypertensive medications.    DVT prophylaxis: Heparin Code Status: DNR Family Communication: Updated patient.  No family present. Disposition Plan: To be determined.   Consultants:   Palliative care pending  Procedures:  CT abdomen and pelvis 10/10/2018    Antimicrobials:   IV cefepime 10/10/2018  IV vancomycin 10/10/2018   Subjective: Patient somewhat drowsy however easily arousable.  Following commands.  Denies any chest pain.  No shortness of breath.  Objective: Vitals:   10/09/18 2300 10/10/18 0300 10/10/18 0637  BP: (!) 136/115  (!) 125/100  Pulse: 81  77  Resp: (!) 22  (!) 22  Temp: 98.2 F (36.8 C)  97.9 F (36.6 C)  TempSrc:   Oral  SpO2: 100%  (!) 86%  Weight: 107 kg    Height:  6' (1.829 m)     Intake/Output Summary (Last 24 hours) at 10/10/2018 1032 Last data filed at 10/10/2018 6962 Gross per 24 hour  Intake 350 ml  Output 325 ml  Net 25 ml   Filed Weights   10/09/18 2300  Weight: 107 kg    Examination:  General exam: Appears calm and comfortable  Respiratory system: Decreased breath sounds in the right base.  Some coarse breath sounds.  No wheezing.  No crackles.  Cardiovascular system: Regular rate rhythm with 3/6 systolic ejection murmur right upper sternal border, left upper sternal border, left lower sternal border. No JVD. No pedal edema. Gastrointestinal system: Abdomen is nondistended, soft and nontender. No organomegaly or masses felt. Normal bowel sounds heard. Central nervous system: Drowsy.  Moving extremities spontaneously.  Asterixis.   Extremities: Symmetric 5 x 5 power. Skin:           No rashes,  lesions or ulcers  Psychiatry: Judgement and insight appear normal. Mood & affect appropriate.     Data Reviewed: I have personally reviewed following labs and imaging studies  CBC: Recent Labs  Lab 10/10/18 0014  WBC 20.2*  HGB 8.2*  HCT 27.2*  MCV 86.3  PLT 952   Basic Metabolic Panel: Recent Labs  Lab 10/10/18 0014 10/10/18 0455 10/10/18 0743  NA 136  --   --   K 5.1 4.7 4.4  CL 107  --   --   CO2 16*  --   --   GLUCOSE 147*  --   --   BUN 138*  --   --   CREATININE 3.78*  --   --   CALCIUM 8.7*  --   --    GFR: Estimated Creatinine Clearance: 19.4 mL/min (Billy) (by C-G formula based on SCr of 3.78 mg/dL (H)). Liver Function Tests: No results for input(s): AST, ALT, ALKPHOS, BILITOT, PROT, ALBUMIN in the last 168 hours. No results for input(s): LIPASE, AMYLASE in the last 168 hours. No  results for input(s): AMMONIA in the last 168 hours. Coagulation Profile: No results for input(s): INR, PROTIME in the last 168 hours. Cardiac Enzymes: No results for input(s): CKTOTAL, CKMB, CKMBINDEX, TROPONINI in the last 168 hours. BNP (last 3 results) No results for input(s): PROBNP in the last 8760 hours. HbA1C: No results for input(s): HGBA1C in the last 72 hours. CBG: No results for input(s): GLUCAP in the last 168 hours. Lipid Profile: No results for input(s): CHOL, HDL, LDLCALC, TRIG, CHOLHDL, LDLDIRECT in the last 72 hours. Thyroid Function Tests: No results for input(s): TSH, T4TOTAL, FREET4, T3FREE, THYROIDAB in the last 72 hours. Anemia Panel: No results for input(s): VITAMINB12, FOLATE, FERRITIN, TIBC, IRON, RETICCTPCT in the last 72 hours. Sepsis Labs: No results for input(s): PROCALCITON, LATICACIDVEN in the last 168 hours.  Recent Results (from the past 240 hour(s))  MRSA PCR Screening     Status: Abnormal   Collection Time: 10/10/18  2:10 AM  Result Value Ref Range Status   MRSA by PCR POSITIVE (Billy) NEGATIVE Final    Comment:        The GeneXpert MRSA  Assay (FDA approved for NASAL specimens only), is one component of Billy comprehensive MRSA colonization surveillance program. It is not intended to diagnose MRSA infection nor to guide or monitor treatment for MRSA infections. GENGLER,K RN 10/10/2018 AT 0354 SKEEN,P Performed at [facility], [place] [address] [place], [place] [address]          Radiology Studies: Ct Abdomen Pelvis Wo Contrast  Result Date: 10/10/2018 CLINICAL DATA:  Hyperdense 13 mm lesion near the upper pole of the right kidney. EXAM: CT ABDOMEN AND PELVIS WITHOUT CONTRAST TECHNIQUE: Multidetector CT imaging of the abdomen and pelvis was performed following the standard protocol without IV contrast. COMPARISON:  Renal ultrasound 09/12/2018. FINDINGS: Lower chest: Right lower lobe airspace disease is present. Billy moderate right-sided pleural effusion is present. Heart size is normal. Atherosclerotic calcifications are present. Aortic valve calcifications are noted as well. Minimal atelectasis is present at the left base. Hepatobiliary: Billy 7 mm gallstone is present at the neck of the gallbladder. The gallbladder is somewhat distended without inflammatory change. The liver is unremarkable. Pancreas: Unremarkable. No pancreatic ductal dilatation or surrounding inflammatory changes. Spleen: Normal in size without focal abnormality. Adrenals/Urinary Tract: Adrenal glands are normal bilaterally. Kidneys are moderately atrophic. Billy 13 mm hyperdense lesion along the lateral aspect the right kidney was demonstrate to be cystic on the most recent study. Billy mass at the lower pole of the left kidney is again seen, measuring 5.7 x 4.2 cm. There are no stones. Vascular calcifications are present. No obstruction is present. Ureters are within normal limits bilaterally. Billy Foley catheter is present within the urinary bladder. Stomach/Bowel: The stomach and duodenum are within normal limits. The small bowel is unremarkable. The ascending and  transverse colon are normal. The descending and sigmoid colon are normal. Moderate stool is present at the rectum without obstruction. Vascular/Lymphatic: Atherosclerotic calcifications are present in the aorta. No significant adenopathy is present. Reproductive: Prostate is unremarkable. Other: No abdominal wall hernia or abnormality. No abdominopelvic ascites. Musculoskeletal: Multilevel degenerative changes are present throughout the thoracolumbar spine. There is fusion across spinous processes in the lower thoracic spine. No focal lytic or blastic lesions are present. Bony pelvis is within normal limits. The hips are located. IMPRESSION: 1. No renal obstruction. 2. Bilateral renal atrophy. 3. 5.7 cm mass lesion at the lower pole of the left kidney is indeterminate, but  concerning for Billy renal cell carcinoma. MRI of the kidneys would be useful for further evaluation if the patient can tolerate the exam. 4. Right lower lobe pneumonia and pleural effusion. 5.  Aortic Atherosclerosis (ICD10-I70.0). Electronically Signed   By: San Morelle M.D.   On: 10/10/2018 04:14        Scheduled Meds: . amLODipine  5 mg Oral Daily  . aspirin EC  81 mg Oral Daily  . atorvastatin  20 mg Oral Daily  . bisacodyl  10 mg Rectal Once  . bisoprolol  5 mg Oral Daily  . buPROPion  150 mg Oral Daily  . chlorhexidine  15 mL Mouth Rinse BID  . Chlorhexidine Gluconate Cloth  6 each Topical Q0600  . doxazosin  4 mg Oral Daily  . escitalopram  10 mg Oral Daily  . ferrous sulfate  325 mg Oral BID WC  . fluticasone  2 spray Each Nare Daily  . Gerhardt's butt cream   Topical QID  . guaiFENesin  1,200 mg Oral BID  . heparin  5,000 Units Subcutaneous Q8H  . loratadine  10 mg Oral Daily  . mouth rinse  15 mL Mouth Rinse q12n4p  . multivitamin with minerals  1 tablet Oral Daily  . mupirocin ointment  1 application Nasal BID  . nystatin   Topical TID  . pantoprazole  40 mg Oral Q0600  . sodium chloride flush  3 mL  Intravenous Q12H   Continuous Infusions: . ceFEPime (MAXIPIME) IV    .  sodium bicarbonate (isotonic) infusion in sterile water 125 mL/hr at 10/10/18 0157  . vancomycin 2,000 mg (10/10/18 0955)     LOS: 1 day    Time spent: 40 minutes  No charge.    Irine Seal, MD [facility]  If 7PM-7AM, please contact night-coverage [email or web address] 10/10/2018, 10:32 AM

## 2018-10-10 NOTE — Progress Notes (Signed)
Patient brought to radiology department for possible thoracentesis.  Patient oriented to self only.  States he is nauseated, appears uncomfortable, possibly from decubitus.    Attempted to call daughter for consent, however no answer.  Patient comfortable on [place], not in distress.  Return to unit today.  Will continue to work towards procedure.   Brynda Greathouse, MS RD PA-C

## 2018-10-11 ENCOUNTER — Encounter (HOSPITAL_COMMUNITY): Payer: Self-pay | Admitting: Student

## 2018-10-11 ENCOUNTER — Inpatient Hospital Stay (HOSPITAL_COMMUNITY): Payer: Medicare HMO

## 2018-10-11 DIAGNOSIS — D649 Anemia, unspecified: Secondary | ICD-10-CM

## 2018-10-11 DIAGNOSIS — J189 Pneumonia, unspecified organism: Secondary | ICD-10-CM

## 2018-10-11 DIAGNOSIS — L8990 Pressure ulcer of unspecified site, unspecified stage: Secondary | ICD-10-CM

## 2018-10-11 DIAGNOSIS — J918 Pleural effusion in other conditions classified elsewhere: Secondary | ICD-10-CM

## 2018-10-11 DIAGNOSIS — J9601 Acute respiratory failure with hypoxia: Secondary | ICD-10-CM | POA: Clinically undetermined

## 2018-10-11 DIAGNOSIS — J90 Pleural effusion, not elsewhere classified: Secondary | ICD-10-CM

## 2018-10-11 HISTORY — PX: IR THORACENTESIS ASP PLEURAL SPACE W/IMG GUIDE: IMG5380

## 2018-10-11 LAB — BLOOD GAS, ARTERIAL
Acid-base deficit: 1.8 mmol/L (ref 0.0–2.0)
Bicarbonate: 22.1 mmol/L (ref 20.0–28.0)
Drawn by: 237031
O2 Content: 6 L/min
O2 Saturation: 88.7 %
Patient temperature: 98.6
pCO2 arterial: 35 mmHg (ref 32.0–48.0)
pH, Arterial: 7.417 (ref 7.350–7.450)
pO2, Arterial: 57.9 mmHg — ABNORMAL LOW (ref 83.0–108.0)

## 2018-10-11 LAB — BODY FLUID CELL COUNT WITH DIFFERENTIAL
Eos, Fluid: 0 %
Lymphs, Fluid: 6 %
Monocyte-Macrophage-Serous Fluid: 17 % — ABNORMAL LOW (ref 50–90)
Neutrophil Count, Fluid: 77 % — ABNORMAL HIGH (ref 0–25)
Total Nucleated Cell Count, Fluid: 1400 cu mm — ABNORMAL HIGH (ref 0–1000)

## 2018-10-11 LAB — COMPREHENSIVE METABOLIC PANEL
ALT: 25 U/L (ref 0–44)
AST: 35 U/L (ref 15–41)
Albumin: 2 g/dL — ABNORMAL LOW (ref 3.5–5.0)
Alkaline Phosphatase: 84 U/L (ref 38–126)
Anion gap: 13 (ref 5–15)
BUN: 121 mg/dL — ABNORMAL HIGH (ref 8–23)
CO2: 22 mmol/L (ref 22–32)
Calcium: 8.2 mg/dL — ABNORMAL LOW (ref 8.9–10.3)
Chloride: 104 mmol/L (ref 98–111)
Creatinine, Ser: 3.03 mg/dL — ABNORMAL HIGH (ref 0.61–1.24)
GFR calc Af Amer: 21 mL/min — ABNORMAL LOW (ref >60)
GFR calc non Af Amer: 18 mL/min — ABNORMAL LOW (ref >60)
Glucose, Bld: 97 mg/dL (ref 70–99)
Potassium: 3.9 mmol/L (ref 3.5–5.1)
Sodium: 139 mmol/L (ref 135–145)
Total Bilirubin: 1 mg/dL (ref 0.3–1.2)
Total Protein: 5.1 g/dL — ABNORMAL LOW (ref 6.5–8.1)

## 2018-10-11 LAB — CBC WITH DIFFERENTIAL/PLATELET
Abs Immature Granulocytes: 0.06 10*3/uL (ref 0.00–0.07)
Basophils Absolute: 0 10*3/uL (ref 0.0–0.1)
Basophils Relative: 0 %
Eosinophils Absolute: 0.1 10*3/uL (ref 0.0–0.5)
Eosinophils Relative: 1 %
HCT: 24 % — ABNORMAL LOW (ref 39.0–52.0)
Hemoglobin: 7.4 g/dL — ABNORMAL LOW (ref 13.0–17.0)
Immature Granulocytes: 1 %
Lymphocytes Relative: 4 %
Lymphs Abs: 0.5 10*3/uL — ABNORMAL LOW (ref 0.7–4.0)
MCH: 26.7 pg (ref 26.0–34.0)
MCHC: 30.8 g/dL (ref 30.0–36.0)
MCV: 86.6 fL (ref 80.0–100.0)
Monocytes Absolute: 0.7 10*3/uL (ref 0.1–1.0)
Monocytes Relative: 6 %
Neutro Abs: 11.3 10*3/uL — ABNORMAL HIGH (ref 1.7–7.7)
Neutrophils Relative %: 88 %
Platelets: 155 10*3/uL (ref 150–400)
RBC: 2.77 MIL/uL — ABNORMAL LOW (ref 4.22–5.81)
RDW: 18.6 % — ABNORMAL HIGH (ref 11.5–15.5)
WBC: 12.8 10*3/uL — ABNORMAL HIGH (ref 4.0–10.5)
nRBC: 0 % (ref 0.0–0.2)

## 2018-10-11 LAB — PROTEIN, PLEURAL OR PERITONEAL FLUID: Total protein, fluid: 3.1 g/dL

## 2018-10-11 LAB — GRAM STAIN

## 2018-10-11 LAB — HEMOGLOBIN AND HEMATOCRIT, BLOOD
HCT: 25.6 % — ABNORMAL LOW (ref 39.0–52.0)
Hemoglobin: 7.9 g/dL — ABNORMAL LOW (ref 13.0–17.0)

## 2018-10-11 LAB — C-REACTIVE PROTEIN: CRP: 22.6 mg/dL — ABNORMAL HIGH (ref <1.0)

## 2018-10-11 LAB — LACTATE DEHYDROGENASE: LDH: 174 U/L (ref 98–192)

## 2018-10-11 LAB — INFLUENZA PANEL BY PCR (TYPE A & B)
Influenza A By PCR: NEGATIVE
Influenza B By PCR: NEGATIVE

## 2018-10-11 LAB — SEDIMENTATION RATE: Sed Rate: 125 mm/hr — ABNORMAL HIGH (ref 0–16)

## 2018-10-11 LAB — GLUCOSE, PLEURAL OR PERITONEAL FLUID: Glucose, Fluid: 129 mg/dL

## 2018-10-11 LAB — FERRITIN: Ferritin: 705 ng/mL — ABNORMAL HIGH (ref 24–336)

## 2018-10-11 LAB — LACTATE DEHYDROGENASE, PLEURAL OR PERITONEAL FLUID: LD, Fluid: 321 U/L — ABNORMAL HIGH (ref 3–23)

## 2018-10-11 MED ORDER — FUROSEMIDE 10 MG/ML IJ SOLN
20.0000 mg | Freq: Once | INTRAMUSCULAR | Status: AC
  Administered 2018-10-11: 20 mg via INTRAVENOUS
  Filled 2018-10-11: qty 2

## 2018-10-11 MED ORDER — LIDOCAINE HCL 1 % IJ SOLN
INTRAMUSCULAR | Status: AC
  Filled 2018-10-11: qty 20

## 2018-10-11 MED ORDER — LIDOCAINE HCL 1 % IJ SOLN
INTRAMUSCULAR | Status: AC | PRN
  Administered 2018-10-11: 10 mL

## 2018-10-11 NOTE — Progress Notes (Signed)
Called by central monitoring ,patient Cardiac monitor  noted  with short run of slow heart  rate 43 beats/min   then back to SR with 1st degree AV block, patient asymptomatic, 02 5l/mnc in use. Dr Grandville Silos was paged , awaiting call back to update. Kept monitored. Incoming RN made aware.

## 2018-10-11 NOTE — Progress Notes (Addendum)
PROGRESS NOTE    Billy [name]  ZHG:[id number] DOB: 05/21/1935 DOA: 10/09/2018 PCP: Patient, No Pcp Per   Brief Narrative:  HPI per Dr. Jamesetta Geralds Alles is a 84 y.o. male with medical history significant of CKD stage 4, HTN, DM that appears diet controlled.  Late last month he was admitted for CHF due to severe AS, diuresed, noted to have somewhat worsening renal function with diuresis.  Cardiology didn't feel he would be a good candidate for intervention regarding his AS.  Nephrology likewise didn't think he would be a candidate at all for long term dialysis.  Pal care spoke with patient, he apparently made it clear he wasn't interested in long term dialysis or heroic measures as their 3/21 note discusses.  He was discharged home with Palliative / Hospice care.  Seen by cardiology for video office visit (due to current [place] pandemic), on 4/10.  Cardiologist noted that he was quite "sluggish", and noted "I am worried about potentially worsening of his kidney function with quite aggressive diuresis."  Chem 7 was ordered, obtained today.  And showed BUN in the 140s with creat of 3.5.  Patient sent in to ED.   ED Course: BUN 140, creat 3.5.  Bicarb 18.  K 5.7 (temp measures given, no EKG changes).  Patient with AMS.  Patient transferred to [facility].  Assessment & Plan:   Principal Problem:   Acute respiratory failure with hypoxia (HCC) Active Problems:   Acute renal failure superimposed on stage 4 chronic kidney disease (HCC)   HCAP (healthcare-associated pneumonia)   Pleural effusion   Type 2 diabetes mellitus (HCC)   Hyperlipidemia   Essential hypertension, benign   Severe aortic stenosis   Hypertension   Uremic encephalopathy   Cellulitis of scrotum   Anemia  1 acute respiratory failure with hypoxia Patient noted to have increased O2 requirement since the evening of 10/10/2018.  Questionable etiology.  Likely multifactorial secondary to right lower lobe pneumonia and probable  parapneumonic effusion in the setting of severe AS and concerns for possible volume overload.  Influenza PCR negative.  Patient with no cough.  Check ABG, chest x-ray, ferritin, CRP, sed rate, LDH.  Patient for ultrasound-guided thoracentesis today.  NSL IVF. Lasix 20 mg IV x1.  Hemoglobin dropping likely dilutional in nature as patient with no overt GI bleed.  Repeat H&H this afternoon with transfusion threshold hemoglobin less than 7.  Continue empiric IV vancomycin IV cefepime.  If worsening respiratory status will consult with critical care medicine for further evaluation and management.  2 acute renal failure on chronic kidney disease stage IV Likely secondary to prerenal azotemia from aggressive diuresis.  It was noted from prior hospitalization that cardiology was concerned about this possibility as well as lethargy.  Patient did have a video visit 5 days prior to admission.  Patient noted not to be a candidate and does not want dialysis per palliative care notes and nephrology notes from last admission.  Hyperkalemia improved.  Renal function improving and creatinine down to 3.03 from 3.78 on admission.  BUN at 121 from 138 on admission.  Patient still with asterixis however mentation slowly improving.  Patient noted to be more hypoxic and due to history of severe AS. NSL IVF. CT abdomen and pelvis negative for hydronephrosis.  Foley catheter in place.  Diuretics on hold. Follow.  3.  Acute metabolic encephalopathy likely secondary to uremic encephalopathy Patient noted to have asterixis on examination.  Patient was confused on admission.  Patient on  admission noted to have BUN of 138 with a creatinine of 3.78.  Renal function slowly trending down with BUN 121 and Cr 3.03.  Patient more alert today and less confused and less drowsy.  Due to hypoxia IV fluid rate has been decreased to 75 cc/h.  Follow.  4.  HCAP/probable parapneumonic effusion CT abdomen and pelvis noted a right lower lobe pneumonia  as well as a pleural effusion.  Urine strep pneumococcus antigen pending.  Urine Legionella antigen pending.  Blood cultures pending with no growth to date. Patient with a leukocytosis with a white count of 20.2 is trending down and currently at 12.8.  IR for ultrasound-guided thoracentesis hopefully today.  Discontinued IV Rocephin and placed empirically on IV cefepime and IV vancomycin as patient with recent hospitalization in the last month.  Place on Mucinex.  SLP evaluation.  5.  Iron deficiency anemia/anemia of chronic disease Patient with no overt bleeding.  7.4 likely dilutional.  Follow H&H.  Transfusion threshold hemoglobin less than 7.  Continue oral iron supplementation.  Repeat H&H this afternoon.  Transfusion threshold hemoglobin less than 7.  6.  Severe AS Patient deemed not a candidate for intervention per cardiologist last note from prior admission.  Patient with increased O2 requirements with hypoxia currently requiring 6 L.  NSL IVF. Patient does not look clinically volume overloaded on examination.  Repeat chest x-ray pending.  Monitor closely for volume overload with hydration.  Follow.  7.  UTI Urine cultures and blood cultures pending.  With 20,000 colonies of gram-negative rods.  Blood cultures pending.  IV Rocephin was changed to IV cefepime due to concerns for healthcare associated pneumonia parapneumonic effusion.  Follow.    8.  Cellulitis of the scrotum/Candida in the scrotum/MASD and wounds to perineum, buttocks Patient noted to have a candidal rash issues during last admission.  Patient with a leukocytosis with a white count of 20.2 which is trending down.Marland Kitchen  MRSA PCR positive.  CT abdomen and pelvis with no indication of Fournier's.  IV Rocephin has been changed to IV vancomycin IV cefepime as patient also noted to have a pneumonia.  Wound care consulted.  Continue nystatin topical powder.  Doubt if patient will be able to make it through surgery and subsequent ICU stay if  indeed he has a Fournier's gangrene.  Continue current wound care as recommended per wound care RN.  9.  Hypertension Diuretics on hold.  Continue current regimen of antihypertensive medications.  10.  Chronic diastolic heart failure Patient with increased hypoxia since yesterday evening now requiring 6 L.  Patient was on IV fluids for acute on chronic kidney disease.  Patient also noted to have a pneumonia.  Diuretics on hold.  Will saline lock IV fluids.  Monitor renal function and if continued improvement likely resume half home regimen of diuretics in the next 24 to 48 hours.  Follow.    DVT prophylaxis: Heparin Code Status: DNR Family Communication: Updated patient.  No family present. Disposition Plan: To be determined.   Consultants:   Palliative care: Vinie Sill, NP 07/20/5807  Procedures:  CT abdomen and pelvis 10/10/2018  Thoracentesis pending 10/11/2018  Antimicrobials:   IV cefepime 10/10/2018  IV vancomycin 10/10/2018   Subjective: Patient more alert today.  Asterixis.  Following commands.  Denies any chest pain.  Some shortness of breath.  Patient noted to have increased O2 requirements now at 6 L with sats of 96%.  Objective: Vitals:   10/10/18 2122 10/11/18 0406 10/11/18 9833  10/11/18 0755  BP: (!) 103/38 111/76 (!) 120/47 (!) 115/52  Pulse: 80 92 74 85  Resp: 19 19 20 20   Temp: 97.8 F (36.6 C) 98.1 F (36.7 C) 99.4 F (37.4 C) 99 F (37.2 C)  TempSrc: Oral Oral Oral Oral  SpO2: 97% 97% 98% 96%  Weight: 106.3 kg     Height:        Intake/Output Summary (Last 24 hours) at 10/11/2018 1005 Last data filed at 10/11/2018 0900 Gross per 24 hour  Intake 2110 ml  Output 2050 ml  Net 60 ml   Filed Weights   10/09/18 2300 10/10/18 2122  Weight: 107 kg 106.3 kg    Examination:  General exam: Appears calm and comfortable  Respiratory system: Decreased breath sounds in the right base.  No crackles noted.  No wheezing.  Thoracoabdominal dyssynchrony.   Speaking in full sentences. Cardiovascular system: RRR with 3/6 SEM right upper sternal border, left upper sternal border, left lower sternal border. No JVD. No pedal edema. Gastrointestinal system: Abdomen is soft, nontender, nondistended, positive bowel sounds.  No rebound.  No guarding.  Central nervous system: More alert.  Moving extremities spontaneously.  Asterixis.  Extremities: Symmetric 5 x 5 power. Skin:           No rashes, lesions or ulcers  Psychiatry: Judgement and insight appear fair. Mood & affect appropriate.     Data Reviewed: I have personally reviewed following labs and imaging studies  CBC: Recent Labs  Lab 10/10/18 0014 10/11/18 0651  WBC 20.2* 12.8*  NEUTROABS  --  11.3*  HGB 8.2* 7.4*  HCT 27.2* 24.0*  MCV 86.3 86.6  PLT 234 474   Basic Metabolic Panel: Recent Labs  Lab 10/10/18 0014 10/10/18 0455 10/10/18 0743 10/10/18 1109 10/11/18 0651  NA 136  --   --  138 139  K 5.1 4.7 4.4 4.6 3.9  CL 107  --   --  104 104  CO2 16*  --   --  20* 22  GLUCOSE 147*  --   --  137* 97  BUN 138*  --   --  132* 121*  CREATININE 3.78*  --   --  3.49* 3.03*  CALCIUM 8.7*  --   --  8.5* 8.2*   GFR: Estimated Creatinine Clearance: 24.1 mL/min (A) (by C-G formula based on SCr of 3.03 mg/dL (H)). Liver Function Tests: Recent Labs  Lab 10/10/18 1109 10/11/18 0651  AST 41 35  ALT 24 25  ALKPHOS 77 84  BILITOT 0.8 1.0  PROT 5.5* 5.1*  ALBUMIN 2.3* 2.0*   No results for input(s): LIPASE, AMYLASE in the last 168 hours. No results for input(s): AMMONIA in the last 168 hours. Coagulation Profile: No results for input(s): INR, PROTIME in the last 168 hours. Cardiac Enzymes: No results for input(s): CKTOTAL, CKMB, CKMBINDEX, TROPONINI in the last 168 hours. BNP (last 3 results) No results for input(s): PROBNP in the last 8760 hours. HbA1C: No results for input(s): HGBA1C in the last 72 hours. CBG: No results for input(s): GLUCAP in the last 168  hours. Lipid Profile: No results for input(s): CHOL, HDL, LDLCALC, TRIG, CHOLHDL, LDLDIRECT in the last 72 hours. Thyroid Function Tests: No results for input(s): TSH, T4TOTAL, FREET4, T3FREE, THYROIDAB in the last 72 hours. Anemia Panel: Recent Labs    10/10/18 1109  VITAMINB12 568  FOLATE 26.7  FERRITIN 491*  TIBC 161*  IRON 16*   Sepsis Labs: No results for input(s): PROCALCITON,  LATICACIDVEN in the last 168 hours.  Recent Results (from the past 240 hour(s))  Culture, Urine     Status: Abnormal (Preliminary result)   Collection Time: 10/09/18 11:56 PM  Result Value Ref Range Status   Specimen Description URINE, RANDOM  Final   Special Requests   Final    NONE Performed at [facility], [address] [place], [place] [address]    Culture 20,000 COLONIES/mL GRAM NEGATIVE RODS (A)  Final   Report Status PENDING  Incomplete  Culture, blood (routine x 2)     Status: None (Preliminary result)   Collection Time: 10/10/18 12:15 AM  Result Value Ref Range Status   Specimen Description BLOOD RIGHT HAND  Final   Special Requests   Final    BOTTLES DRAWN AEROBIC AND ANAEROBIC Blood Culture adequate volume   Culture   Final    NO GROWTH 1 DAY Performed at [facility], [place] [address], [place], [place] [address]    Report Status PENDING  Incomplete  Culture, blood (routine x 2)     Status: None (Preliminary result)   Collection Time: 10/10/18 12:15 AM  Result Value Ref Range Status   Specimen Description BLOOD LEFT HAND  Final   Special Requests   Final    BOTTLES DRAWN AEROBIC ONLY Blood Culture results may not be optimal due to an excessive volume of blood received in culture bottles   Culture   Final    NO GROWTH 1 DAY Performed at [facility], [place] [address], [place], [place] [address]    Report Status PENDING  Incomplete  MRSA PCR Screening     Status: Abnormal   Collection Time: 10/10/18  2:10 AM  Result Value Ref Range Status   MRSA by PCR  POSITIVE (A) NEGATIVE Final    Comment:        The GeneXpert MRSA Assay (FDA approved for NASAL specimens only), is one component of a comprehensive MRSA colonization surveillance program. It is not intended to diagnose MRSA infection nor to guide or monitor treatment for MRSA infections. GENGLER,K RN 10/10/2018 AT 0354 SKEEN,P Performed at [facility], [place] [address], [place], [place] [address]          Radiology Studies: Ct Abdomen Pelvis Wo Contrast  Result Date: 10/10/2018 CLINICAL DATA:  Hyperdense 13 mm lesion near the upper pole of the right kidney. EXAM: CT ABDOMEN AND PELVIS WITHOUT CONTRAST TECHNIQUE: Multidetector CT imaging of the abdomen and pelvis was performed following the standard protocol without IV contrast. COMPARISON:  Renal ultrasound 09/12/2018. FINDINGS: Lower chest: Right lower lobe airspace disease is present. A moderate right-sided pleural effusion is present. Heart size is normal. Atherosclerotic calcifications are present. Aortic valve calcifications are noted as well. Minimal atelectasis is present at the left base. Hepatobiliary: A 7 mm gallstone is present at the neck of the gallbladder. The gallbladder is somewhat distended without inflammatory change. The liver is unremarkable. Pancreas: Unremarkable. No pancreatic ductal dilatation or surrounding inflammatory changes. Spleen: Normal in size without focal abnormality. Adrenals/Urinary Tract: Adrenal glands are normal bilaterally. Kidneys are moderately atrophic. A 13 mm hyperdense lesion along the lateral aspect the right kidney was demonstrate to be cystic on the most recent study. A mass at the lower pole of the left kidney is again seen, measuring 5.7 x 4.2 cm. There are no stones. Vascular calcifications are present. No obstruction is present. Ureters are within normal limits bilaterally. A Foley catheter  is present within the urinary bladder. Stomach/Bowel: The stomach and duodenum are within  normal limits. The small bowel is unremarkable. The ascending and transverse colon are normal. The descending and sigmoid colon are normal. Moderate stool is present at the rectum without obstruction. Vascular/Lymphatic: Atherosclerotic calcifications are present in the aorta. No significant adenopathy is present. Reproductive: Prostate is unremarkable. Other: No abdominal wall hernia or abnormality. No abdominopelvic ascites. Musculoskeletal: Multilevel degenerative changes are present throughout the thoracolumbar spine. There is fusion across spinous processes in the lower thoracic spine. No focal lytic or blastic lesions are present. Bony pelvis is within normal limits. The hips are located. IMPRESSION: 1. No renal obstruction. 2. Bilateral renal atrophy. 3. 5.7 cm mass lesion at the lower pole of the left kidney is indeterminate, but concerning for a renal cell carcinoma. MRI of the kidneys would be useful for further evaluation if the patient can tolerate the exam. 4. Right lower lobe pneumonia and pleural effusion. 5.  Aortic Atherosclerosis (ICD10-I70.0). Electronically Signed   By: San Morelle M.D.   On: 10/10/2018 04:14   Dg Chest Port 1 View  Result Date: 10/11/2018 CLINICAL DATA:  Shortness of breath EXAM: PORTABLE CHEST 1 VIEW COMPARISON:  October 09, 2018 FINDINGS: There is a right pleural effusion. There is atelectatic change in the lung bases. Heart is upper normal in size with pulmonary vascularity normal. No adenopathy. There is aortic atherosclerosis. No bone lesions. IMPRESSION: Right pleural effusion. Bibasilar atelectasis. It should be noted that a degree of consolidation on the right could be obscured by effusion. Stable cardiac silhouette. Aortic Atherosclerosis (ICD10-I70.0). Electronically Signed   By: Lowella Grip III M.D.   On: 10/11/2018 08:25        Scheduled Meds:  aspirin EC  81 mg Oral Daily   atorvastatin  20 mg Oral Daily   bisacodyl  10 mg Rectal Once     bisoprolol  5 mg Oral Daily   buPROPion  150 mg Oral Daily   chlorhexidine  15 mL Mouth Rinse BID   Chlorhexidine Gluconate Cloth  6 each Topical Q0600   doxazosin  4 mg Oral Daily   escitalopram  10 mg Oral Daily   ferrous sulfate  325 mg Oral BID WC   fluticasone  2 spray Each Nare Daily   Gerhardt's butt cream   Topical QID   guaiFENesin  1,200 mg Oral BID   heparin  5,000 Units Subcutaneous Q8H   loratadine  10 mg Oral Daily   mouth rinse  15 mL Mouth Rinse q12n4p   multivitamin with minerals  1 tablet Oral Daily   mupirocin ointment  1 application Nasal BID   nystatin   Topical TID   pantoprazole  40 mg Oral Q0600   sodium chloride flush  3 mL Intravenous Q12H   Continuous Infusions:  ceFEPime (MAXIPIME) IV 2 g (10/10/18 1246)    sodium bicarbonate (isotonic) infusion in sterile water 75 mL/hr at 10/11/18 0441     LOS: 2 days    Time spent: 40 minutes    Irine Seal, MD [facility]  If 7PM-7AM, please contact night-coverage [email or web address] 10/11/2018, 10:05 AM

## 2018-10-11 NOTE — Procedures (Signed)
PROCEDURE SUMMARY:  Successful US guided diagnostic and therapeutic right thoracentesis. Yielded 1.0 liters of bloody fluid. Pt tolerated procedure well. No immediate complications.  Specimen was sent for labs. CXR ordered.  EBL < 5 mL  Docia Barrier PA-C 10/11/2018 1:59 PM

## 2018-10-11 NOTE — Progress Notes (Signed)
  Speech Language Pathology Treatment: Dysphagia  Patient Details Name: Billy [name] MRN: [id number] DOB: 07/18/1934 Today's Date: 10/11/2018 Time: 1000-1020 SLP Time Calculation (min) (ACUTE ONLY): 20 min  Assessment / Plan / Recommendation Clinical Impression  Patient seen to address dysphagia goals with puree solids and thin liquids (current diet is Dys 3 and thin liquids but patient declined solids). Per patient and confirmed by RN he did not eat anything at breakfast and patient reported "Im not very hungry". Patient did consume 80% of cup of pudding and 4-5 straw sips of thin liquids without any overt s/s of aspiration or penetration and swallow initiation was timely. Patient's vocal quality remained clear throughout PO intake. Of note, patient with short, shallow breathing prior to PO intake and this was not changed during or after PO intake.     HPI HPI: Billy [name] is a 84 y.o. male with medical history significant of CKD stage 4, HTN, DM, HTN, CHF. Per chart, pt not a candidate for dialysis and Palliative care spoke with patient, he apparently made it clear he wasn't interested in long term dialysis or heroic measures as their 3/21 note discusses and discharged home with Palliative / Hospice care. CT abdomen noted right lower lobe airspace disease, moderate pleural effucion. Found to have AKF on CKD stage 4. NO prior ST documentation located.       SLP Plan  Continue with current plan of care       Recommendations  Diet recommendations: Dysphagia 3 (mechanical soft);Thin liquid Liquids provided via: Straw;Cup Medication Administration: Whole meds with liquid Supervision: Full supervision/cueing for compensatory strategies;Staff to assist with self feeding Compensations: Slow rate;Minimize environmental distractions;Small sips/bites;Lingual sweep for clearance of pocketing;Other (Comment) Postural Changes and/or Swallow Maneuvers: Seated upright 90 degrees                Oral Care Recommendations: Oral care BID Follow up Recommendations: 24 hour supervision/assistance SLP Visit Diagnosis: Dysphagia, oral phase (R13.11) Plan: Continue with current plan of care       GO                Dannial Monarch 10/11/2018, 11:04 AM   Sonia Baller, MA, CCC-SLP Speech Therapy [facility] Pager: [phone]

## 2018-10-11 NOTE — Consult Note (Signed)
[place] Nurse wound consult note Patient receiving care in [facility] 5M13.  Patient able to turn self to left.  Moving his buttocks to attempt to evaluate his skin condition was very painful for him, therefore I was unable to measure the areas.  I will describe them below to the best of my ability. Reason for Consult: MASD and wounds to buttocks, perineum Wound type:  The patient states that when he was at home, he sat every day, 24 hours a day in a chair, and that he would be incontinent or urine and stool routinely.  The right buttock and right perineal fold where the scrotum and the inner upper thigh meet, has extensive areas of grey slough.  The Dermatherapy underpad is heavily soiled with yellow drainage.  There is a heavy foul odor.  The left buttock and perineal fold are impacted as well, but not to the extent of the right.  All of the slough areas are surrounded by denuded/excoriated partial thickness skin loss.  The abdominal and groin folds do seem to be improving with the use of Gerhardt's butt cream. Pressure Injury POA: Yes Measurement: unable to measure Wound bed: as described above Drainage (amount, consistency, odor) see above Periwound: see above Dressing procedure/placement/frequency: At this point in time, I think it may be best to abandon the Gerhardt's butt cream to the buttocks and scrotal fold area.  Instead, use Aquacel Ag+ in an effort to dry things up, absorb drainage, and combat microbes.  It would be appropriate to re-evaluate the area next week for potential topical therapy changes. If the areas continue to worsen, it may be necessary to ask surgical services to consider debridement. Monitor the wound area(s) for worsening of condition such as: Signs/symptoms of infection,  Increase in size,  Development of or worsening of odor, Development of pain, or increased pain at the affected locations.  Notify the medical team if any of these develop.  Thank you for the consult.  Discussed  plan of care with the patient and bedside nurse.    Val Riles, RN, MSN, CWOCN, CNS-BC, pager [phone]

## 2018-10-11 NOTE — Consult Note (Signed)
NAME:  Billy [name], MRN:  [id number], DOB:  24-Nov-1934, LOS: 2 ADMISSION DATE:  10/09/2018, CONSULTATION DATE:  4/17 REFERRING MD:  Grandville Silos, CHIEF COMPLAINT:  Pleural effusion    Brief History   84 year old male patient admitted acute on chronic renal failure in the setting of cardiorenal syndrome with severe aortic stenosis.  Also felt to possibly have pneumonia with associated parapneumonic pleural effusion which was identified on CT abdomen pelvis on 4/16.  Had been started on empiric antibiotics, developed increased oxygen requirements on 4/17, IV fluids discontinued.  Underwent right thoracentesis at which time 1 L of bloody pleural fluid was removed pulmonary asked to evaluate due to finding of exudative pleural effusion  History of present illness   84 year old male patient admitted on 4/15 from the outpatient setting due to worsening renal function.  Patient has a known history of severe aortic stenosis and resultant CHF as well as stage IV chronic kidney disease.  He was already deemed not a candidate for dialysis and had recently been discharged home following a hospitalization for heart failure, at that point with palliative/hospice care.  In the ER he was found to have a new serum creatinine of 3.5 and a BUN of 140, he was admitted for IV hydration and observation.  Working diagnosis at time of admission was acute on chronic renal failure, uremic encephalopathy, possible urinary tract infection and possible cellulitis involving the scrotum. Hospital course: Admitted to the medical floor 4/15.  Started on IV hydration, and bicarbonate supplementation, as well as IV antibiotics. Also felt to possibly have pneumonia with associated parapneumonic pleural effusion which was identified on CT abdomen pelvis on 4/16.  Had been started on empiric antibiotics, developed increased oxygen requirements on 4/17, IV fluids discontinued.  Underwent right thoracentesis at which time 1 L of bloody pleural  fluid was removed pulmonary asked to evaluate due to finding of exudative pleural effusion  Past Medical History  Critical AS, Deemed NOT operative, ,CKD stage IV.  Hypertension.  Diabetes.  Significant Hospital Events    Hospital course: Admitted to the medical floor 4/15.  Started on IV hydration, and bicarbonate supplementation, as well as IV antibiotics. 4/16: CT abdomen pelvis there was concern about right lower lobe pneumonia as well will effusion because of this antibiotics were changed to provide nosocomial coverage additionally interventional radiology was consulted for diagnostic thoracentesis of the pleural effusion 4/17: Remains encephalopathic.BUN and creatinine had improved marginally.  Worsening shortness of breath overnight with increased oxygen requirements.  Felt likely secondary to volume replacement efforts.  Lasix administered.  Also underwent right therapeutic and diagnostic thoracentesis which apparently was reported as bloody.  1 L was removed from pleural space.  It was exudative by light's criteria pulmonary asked to evaluate.  Consults:  Palliative care Pulmonary  Procedures:  Right thoracentesis 4/17.  1 L pleural fluid  Significant Diagnostic Tests:  Right pleural LDH : 321, pleural fluid cell count: Appearance was turbid, white blood cell 1400 Gram stain pending, protein 3.1, pH pending, cytology pending.  Micro Data:  Pleural fluid  Antimicrobials:  Rocephin 4/15 through 4/16 Vancomycin 4/16 Cefepime 4/16  Interim history/subjective:  No distress resting comfortably  Objective   Blood pressure (Abnormal) 115/52, pulse 85, temperature 99 F (37.2 C), temperature source Oral, resp. rate 20, height 6' (1.829 m), weight 106.3 kg, SpO2 96 %.        Intake/Output Summary (Last 24 hours) at 10/11/2018 1534 Last data filed at 10/11/2018 1330 Gross per 24 hour  Intake 2230 ml  Output 1600 ml  Net 630 ml   Filed Weights   10/09/18 2300 10/10/18 2122    Weight: 107 kg 106.3 kg    Examination: General: Lethargic 84 year old male patient resting in bed he is in no acute distress HENT: His membranes are dry neck veins flat Lungs: Diminished bases Cardiovascular: Regular rhythm, loud systolic murmur consistent with aortic stenosis Abdomen: Soft nontender Extremities: Warm and dry no significant edema Neuro: Awake, confused, slow to respond.  Moves all extremities, generalized weakness GU: Voids  Resolved Hospital Problem list    Assessment & Plan:  Acute on chronic hypoxic respiratory failure Acute metabolic encephalopathy Severe deconditioning Acute on chronic renal failure Severe uremia Critical aortic stenosis deemed nonoperable Bloody exudative pleural effusion Possible pneumonia versus atelectasis Metabolic acidosis Iron deficiency anemia Possible urinary tract infection Diastolic heart failure    Acute on chronic hypoxic respiratory failure in the setting of volume overload, atelectasis versus pneumonia and large right bloody appearing exudative pleural effusion  -Certainly could have a pneumonia, either healthcare associated or aspiration given his encephalopathy.  However this could also just simply be compression atelectasis from chronic pleural fluid.  The etiology of the pleural effusion is unclear.  Differential diagnosis at this point would be: Related to his uremic state versus parapneumonic process versus empyema versus malignancy, also consider trauma (he was not able to give me a history of a fall).  His symptom burden seems tolerable at this point, diagnostic panel has been sent from thoracentesis which was performed today on 4/17 -Review of portable chest x-ray post thoracentesis shows improved aeration particularly on the right side  -given his debilitated state, and previously determined being a non-candidate for dialysis or valve repair I am not sure much can be offered in regards to his pleural effusion even  if it was something reversible such as an empyema or parapneumonic process  Plan/recommendation Follow-up current pleural fluid studies including culture and cytology, PH and remaining cell count indices  Continue empiric antibiotics A CT of chest noncontrast was ordered, this will be helpful to further evaluate any residual pleural effusion, although I am not sure he is a candidate for chest tube drainage and certainly not a candidate for thoracic surgery.  If this is indeed secondary to his uremia which would be a diagnosis of exclusion there is little else we can offer other than recommend comfort and would not recommend further invasive procedures in a patient w/ end-stage cardio-renal disease particularly if symptom burden doesn't warrant it.  Would hold any anticoagulation   Labs   CBC: Recent Labs  Lab 10/10/18 0014 10/11/18 0651 10/11/18 1441  WBC 20.2* 12.8*  --   NEUTROABS  --  11.3*  --   HGB 8.2* 7.4* 7.9*  HCT 27.2* 24.0* 25.6*  MCV 86.3 86.6  --   PLT 234 155  --     Basic Metabolic Panel: Recent Labs  Lab 10/10/18 0014 10/10/18 0455 10/10/18 0743 10/10/18 1109 10/11/18 0651  NA 136  --   --  138 139  K 5.1 4.7 4.4 4.6 3.9  CL 107  --   --  104 104  CO2 16*  --   --  20* 22  GLUCOSE 147*  --   --  137* 97  BUN 138*  --   --  132* 121*  CREATININE 3.78*  --   --  3.49* 3.03*  CALCIUM 8.7*  --   --  8.5* 8.2*  GFR: Estimated Creatinine Clearance: 24.1 mL/min (A) (by C-G formula based on SCr of 3.03 mg/dL (H)). Recent Labs  Lab 10/10/18 0014 10/11/18 0651  WBC 20.2* 12.8*    Liver Function Tests: Recent Labs  Lab 10/10/18 1109 10/11/18 0651  AST 41 35  ALT 24 25  ALKPHOS 77 84  BILITOT 0.8 1.0  PROT 5.5* 5.1*  ALBUMIN 2.3* 2.0*   No results for input(s): LIPASE, AMYLASE in the last 168 hours. No results for input(s): AMMONIA in the last 168 hours.  ABG    Component Value Date/Time   PHART 7.417 10/11/2018 0954   PCO2ART 35.0 10/11/2018  0954   PO2ART 57.9 (L) 10/11/2018 0954   HCO3 22.1 10/11/2018 0954   ACIDBASEDEF 1.8 10/11/2018 0954   O2SAT 88.7 10/11/2018 0954     Coagulation Profile: No results for input(s): INR, PROTIME in the last 168 hours.  Cardiac Enzymes: No results for input(s): CKTOTAL, CKMB, CKMBINDEX, TROPONINI in the last 168 hours.  HbA1C: Hgb A1c MFr Bld  Date/Time Value Ref Range Status  09/14/2018 03:02 AM 6.1 (H) 4.8 - 5.6 % Final    Comment:    (NOTE) Pre diabetes:          5.7%-6.4% Diabetes:              >6.4% Glycemic control for   <7.0% adults with diabetes   12/11/2013 09:22 AM 6.5 4.6 - 6.5 % Final    Comment:    Glycemic Control Guidelines for People with Diabetes:Non Diabetic:  <6%Goal of Therapy: <7%Additional Action Suggested:  >8%     CBG: No results for input(s): GLUCAP in the last 168 hours.  Review of Systems:   Unable due to encephalopathy  Past Medical History  He,  has a past medical history of CKD (chronic kidney disease), stage IV ([place]), Diabetes mellitus without complication ([place]), Hypertension, Mixed hyperlipidemia, Morbid obesity ([place]), and Severe aortic stenosis.   Surgical History    Past Surgical History:  Procedure Laterality Date   NO PAST SURGERIES       Social History   reports that he has quit smoking. His smokeless tobacco use includes chew. He reports previous alcohol use. He reports that he does not use drugs.   Family History   His family history is not on file.   Allergies No Known Allergies   Home Medications  Prior to Admission medications   Medication Sig Start Date End Date Taking? Authorizing Provider  amLODipine (NORVASC) 5 MG tablet Take 1 tablet (5 mg total) by mouth daily. 09/17/18   Darreld Mclean, PA-C  aspirin EC 81 MG tablet Take 81 mg by mouth daily.    [provider]  atorvastatin (LIPITOR) 20 MG tablet Take 1 tablet (20 mg total) by mouth daily. 01/12/14   Elayne Snare, MD  bisoprolol (ZEBETA) 5 MG tablet  Take 1 tablet (5 mg total) by mouth daily. 09/17/18   Darreld Mclean, PA-C  buPROPion (WELLBUTRIN XL) 150 MG 24 hr tablet Take 1 tablet (150 mg total) by mouth daily. 01/08/14   Elayne Snare, MD  doxazosin (CARDURA) 4 MG tablet TAKE ONE TABLET BY MOUTH ONCE DAILY 04/15/14   Elayne Snare, MD  escitalopram (LEXAPRO) 10 MG tablet TAKE ONE TABLET BY MOUTH ONCE DAILY 01/08/14   Elayne Snare, MD  ferrous sulfate 325 (65 FE) MG tablet Take 1 tablet (325 mg total) by mouth 2 (two) times daily with a meal. 09/17/18   Darreld Mclean, PA-C  furosemide (LASIX) 80 MG tablet Take 2 tablets (160 mg total) by mouth 2 (two) times daily. 09/17/18   Sande Rives E, PA-C  glucose blood (ACCU-CHEK AVIVA) test strip Use as instructed to check blood sugar 1 time per day dx code 250.00 01/08/14   Elayne Snare, MD  Multiple Vitamin (MULTIVITAMIN WITH MINERALS) TABS tablet Take 1 tablet by mouth daily.    [provider]  Multiple Vitamin (MULTIVITAMIN) capsule Take 1 capsule by mouth daily.    [provider]  nystatin cream (MYCOSTATIN) Apply topically 2 (two) times daily. Apply 2 times daily to red, moist area in bilateral groin/ingluinal folds until healing is complete. 09/17/18   Sande Rives E, PA-C  potassium chloride SA (K-DUR,KLOR-CON) 20 MEQ tablet Take 1 tablet (20 mEq total) by mouth 2 (two) times daily. 09/17/18   Darreld Mclean, PA-C     Erick Colace ACNP-BC [place] Pager # [phone] OR # [phone] if no answer

## 2018-10-12 ENCOUNTER — Inpatient Hospital Stay (HOSPITAL_COMMUNITY): Payer: Medicare HMO

## 2018-10-12 DIAGNOSIS — R0789 Other chest pain: Secondary | ICD-10-CM

## 2018-10-12 DIAGNOSIS — Z7189 Other specified counseling: Secondary | ICD-10-CM

## 2018-10-12 DIAGNOSIS — Z515 Encounter for palliative care: Secondary | ICD-10-CM

## 2018-10-12 LAB — RENAL FUNCTION PANEL
Albumin: 1.9 g/dL — ABNORMAL LOW (ref 3.5–5.0)
Anion gap: 15 (ref 5–15)
BUN: 120 mg/dL — ABNORMAL HIGH (ref 8–23)
CO2: 24 mmol/L (ref 22–32)
Calcium: 8.3 mg/dL — ABNORMAL LOW (ref 8.9–10.3)
Chloride: 100 mmol/L (ref 98–111)
Creatinine, Ser: 2.88 mg/dL — ABNORMAL HIGH (ref 0.61–1.24)
GFR calc Af Amer: 23 mL/min — ABNORMAL LOW (ref >60)
GFR calc non Af Amer: 20 mL/min — ABNORMAL LOW (ref >60)
Glucose, Bld: 182 mg/dL — ABNORMAL HIGH (ref 70–99)
Phosphorus: 4.3 mg/dL (ref 2.5–4.6)
Potassium: 3.7 mmol/L (ref 3.5–5.1)
Sodium: 139 mmol/L (ref 135–145)

## 2018-10-12 LAB — CBC WITH DIFFERENTIAL/PLATELET
Abs Immature Granulocytes: 0.07 10*3/uL (ref 0.00–0.07)
Basophils Absolute: 0 10*3/uL (ref 0.0–0.1)
Basophils Relative: 0 %
Eosinophils Absolute: 0.1 10*3/uL (ref 0.0–0.5)
Eosinophils Relative: 1 %
HCT: 23.9 % — ABNORMAL LOW (ref 39.0–52.0)
Hemoglobin: 7.3 g/dL — ABNORMAL LOW (ref 13.0–17.0)
Immature Granulocytes: 1 %
Lymphocytes Relative: 4 %
Lymphs Abs: 0.5 10*3/uL — ABNORMAL LOW (ref 0.7–4.0)
MCH: 26.6 pg (ref 26.0–34.0)
MCHC: 30.5 g/dL (ref 30.0–36.0)
MCV: 87.2 fL (ref 80.0–100.0)
Monocytes Absolute: 0.7 10*3/uL (ref 0.1–1.0)
Monocytes Relative: 5 %
Neutro Abs: 11.9 10*3/uL — ABNORMAL HIGH (ref 1.7–7.7)
Neutrophils Relative %: 89 %
Platelets: 154 10*3/uL (ref 150–400)
RBC: 2.74 MIL/uL — ABNORMAL LOW (ref 4.22–5.81)
RDW: 18.7 % — ABNORMAL HIGH (ref 11.5–15.5)
WBC: 13.3 10*3/uL — ABNORMAL HIGH (ref 4.0–10.5)
nRBC: 0 % (ref 0.0–0.2)

## 2018-10-12 LAB — URINE CULTURE: Culture: 20000 — AB

## 2018-10-12 LAB — STREP PNEUMONIAE URINARY ANTIGEN: Strep Pneumo Urinary Antigen: NEGATIVE

## 2018-10-12 LAB — MAGNESIUM: Magnesium: 2.7 mg/dL — ABNORMAL HIGH (ref 1.7–2.4)

## 2018-10-12 MED ORDER — FUROSEMIDE 80 MG PO TABS
80.0000 mg | ORAL_TABLET | Freq: Two times a day (BID) | ORAL | Status: DC
  Administered 2018-10-12: 80 mg via ORAL
  Filled 2018-10-12 (×2): qty 1

## 2018-10-12 NOTE — Consult Note (Signed)
Consultation Note Date: 10/12/2018   Patient Name: Billy [name]  DOB: [birth year]  MRN: [id number]  Age / Sex: 84 y.o., male  PCP: Patient, No Pcp Per Referring Physician: Eugenie Filler, MD  Reason for Consultation: Establishing goals of care and Psychosocial/spiritual support  HPI/Patient Profile: 84 y.o. male  with past medical history of severe aortic stenosis, chronic kidney disease stage IV, diabetes, hypertension, CHF with preserved ejection fraction admitted on 10/09/2018 with altered mental status, shortness of breath, volume overload.  Creatinine on admission was 3.5, his potassium 5.7.  Was found to have a UTI.  CT of the chest performed on 10/11/2018 reflects 4 rib fractures on the right side, ribs 7, 8, 9, 10.  Patient also was found to have bilateral effusions and underwent thoracentesis on the right on 10/11/2018.  1 L of bloody fluid was returned.  Consult ordered for goals of care in the setting of deteriorating renal function, ongoing encephalopathy.  Patient has been seen by palliative medicine team in March 2020.Marland Kitchen   Clinical Assessment and Goals of Care: Patient seen, chart reviewed.  He is observed to be alert with a CNA feeding him lunch.  He does not respond to me verbally when I asked him a question.  Per CNA he did state "the food is dry".  For the most part he has been answering simple questions with yes/no answers.  Was able to reach patient's daughter, [name], who along with her Sister Jacqualyn Posey, are patient's healthcare proxy.  He does have a total of 3 children and is married.  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  Daughter reports last Saturday patient fell at home and she called EMS.  Patient refused to come to the hospital.  Daughter reports that he fell on his right side in the restroom and this is likely when he sustained right-sided rib  fractures  Patient's children are his primary healthcare proxy's.  Patient at this point is unable to participate in goals of care but has been able to in the past.  His daughters, [name] at [phone]; Almyra Free Ramo's at [phone], are his primary healthcare proxy's.    SUMMARY OF RECOMMENDATIONS   DNR DNI Patient would not pursue hemodialysis.  Family understands from a cardiac standpoint that hemodialysis would offer more risk than benefit Patient would likely return to the hospital and is not ready for full comfort care.  Family's goals are to continue to treat the treatable with care limits of DNR/DNI, no hemodialysis set Code Status/Advance Care Planning:  DNR    Symptom Management:   Pain: Pain associated with right rib fractures.  Continue with Dilaudid 0.5 mg IV every 4 hours as needed.  When transition patient to oral medication continue with oxycodone 5 to 10 mg every 4 hours as needed.Morphine would be contraindicated secondary to renal impairment  Palliative Prophylaxis:   Aspiration, Bowel Regimen, Delirium Protocol, Eye Care, Frequent Pain Assessment, Oral Care and Turn Reposition  Additional Recommendations (Limitations,  Scope, Preferences):  Minimize Medications, No Artificial Feeding, No Chemotherapy, No Hemodialysis, No Radiation, No Surgical Procedures and No Tracheostomy  Psycho-social/Spiritual:   Desire for further Chaplaincy support:no  Additional Recommendations: Referral to Community Resources   Prognosis:   Unable to determine  Discharge Planning: Home with Home Health      Primary Diagnoses: Present on Admission: . Acute renal failure superimposed on stage 4 chronic kidney disease ([place]) . Essential hypertension, benign . Uremic encephalopathy . Cellulitis of scrotum . HCAP (healthcare-associated pneumonia) . Hypertension . Hyperlipidemia . Anemia . Pleural effusion   I have reviewed the medical record, interviewed the patient  and family, and examined the patient. The following aspects are pertinent.  Past Medical History:  Diagnosis Date  . CKD (chronic kidney disease), stage IV ([place])   . Diabetes mellitus without complication ([place])   . Hypertension   . Mixed hyperlipidemia   . Morbid obesity ([place])   . Severe aortic stenosis    a. 09/09/2018 Echo [facility]): EF 55-60%, mod conc LVH. Nl RV fxn. Sev dil RA w/ elevated LA pressure. Mod RAE. Sev AS w/ peak grad of 73mmHg, mean of 29mmHg. AoV area 0.7cm. Mod TR. RVSP 93mmHg.   Social History   Socioeconomic History  . Marital status: Divorced    Spouse name: Not on file  . Number of children: Not on file  . Years of education: Not on file  . Highest education level: Not on file  Occupational History  . Occupation: Retired    Comment: Retired from [employer] and then from [employer]  . Financial resource strain: Not on file  . Food insecurity:    Worry: Not on file    Inability: Not on file  . Transportation needs:    Medical: Not on file    Non-medical: Not on file  Tobacco Use  . Smoking status: Former Research scientist (life sciences)  . Smokeless tobacco: Current User    Types: Chew  . Tobacco comment: Light smoker ~ 50 yrs ago.  Substance and Sexual Activity  . Alcohol use: Not Currently  . Drug use: Never  . Sexual activity: Not on file  Lifestyle  . Physical activity:    Days per week: Not on file    Minutes per session: Not on file  . Stress: Not on file  Relationships  . Social connections:    Talks on phone: Not on file    Gets together: Not on file    Attends religious service: Not on file    Active member of club or organization: Not on file    Attends meetings of clubs or organizations: Not on file    Relationship status: Not on file  Other Topics Concern  . Not on file  Social History Narrative   Lives in [place] by himself.  Dtrs and sister nearby.  He does not routinely exercise.   History reviewed. No pertinent family history.  Scheduled Meds: . aspirin EC  81 mg Oral Daily  . atorvastatin  20 mg Oral Daily  . bisacodyl  10 mg Rectal Once  . bisoprolol  5 mg Oral Daily  . buPROPion  150 mg Oral Daily  . chlorhexidine  15 mL Mouth Rinse BID  . Chlorhexidine Gluconate Cloth  6 each Topical Q0600  . doxazosin  4 mg Oral Daily  . escitalopram  10 mg Oral Daily  . ferrous sulfate  325 mg Oral BID WC  . fluticasone  2 spray Each Nare Daily  .  Gerhardt's butt cream   Topical QID  . guaiFENesin  1,200 mg Oral BID  . loratadine  10 mg Oral Daily  . mouth rinse  15 mL Mouth Rinse q12n4p  . multivitamin with minerals  1 tablet Oral Daily  . mupirocin ointment  1 application Nasal BID  . nystatin   Topical TID  . pantoprazole  40 mg Oral Q0600  . sodium chloride flush  3 mL Intravenous Q12H   Continuous Infusions: . ceFEPime (MAXIPIME) IV 2 g (10/12/18 0910)   PRN Meds:.acetaminophen **OR** acetaminophen, HYDROmorphone (DILAUDID) injection, ondansetron **OR** ondansetron (ZOFRAN) IV Medications Prior to Admission:  Prior to Admission medications   Medication Sig Start Date End Date Taking? Authorizing Provider  amLODipine (NORVASC) 5 MG tablet Take 1 tablet (5 mg total) by mouth daily. 09/17/18  Yes Darreld Mclean, PA-C  aspirin EC 81 MG tablet Take 81 mg by mouth daily.   Yes [provider]  atorvastatin (LIPITOR) 20 MG tablet Take 1 tablet (20 mg total) by mouth daily. 01/12/14  Yes Elayne Snare, MD  bisoprolol (ZEBETA) 5 MG tablet Take 1 tablet (5 mg total) by mouth daily. 09/17/18  Yes Sande Rives E, PA-C  buPROPion (WELLBUTRIN XL) 150 MG 24 hr tablet Take 1 tablet (150 mg total) by mouth daily. 01/08/14  Yes Elayne Snare, MD  escitalopram (LEXAPRO) 10 MG tablet TAKE ONE TABLET BY MOUTH ONCE DAILY 01/08/14  Yes Elayne Snare, MD  ferrous sulfate 325 (65 FE) MG tablet Take 1 tablet (325 mg total) by mouth 2 (two) times daily with a meal. 09/17/18  Yes Sande Rives E, PA-C  furosemide (LASIX) 80 MG  tablet Take 2 tablets (160 mg total) by mouth 2 (two) times daily. 09/17/18  Yes Sande Rives E, PA-C  glucose blood (ACCU-CHEK AVIVA) test strip Use as instructed to check blood sugar 1 time per day dx code 250.00 01/08/14  Yes Elayne Snare, MD  Multiple Vitamin (MULTIVITAMIN WITH MINERALS) TABS tablet Take 1 tablet by mouth daily.   Yes [provider]  Multiple Vitamin (MULTIVITAMIN) capsule Take 1 capsule by mouth daily.   Yes [provider]  nystatin cream (MYCOSTATIN) Apply topically 2 (two) times daily. Apply 2 times daily to red, moist area in bilateral groin/ingluinal folds until healing is complete. 09/17/18  Yes Sande Rives E, PA-C  potassium chloride SA (K-DUR,KLOR-CON) 20 MEQ tablet Take 1 tablet (20 mEq total) by mouth 2 (two) times daily. 09/17/18  Yes Sande Rives E, PA-C  doxazosin (CARDURA) 4 MG tablet TAKE ONE TABLET BY MOUTH ONCE DAILY Patient not taking: Reported on 10/11/2018 04/15/14   Elayne Snare, MD   No Known Allergies Review of Systems  Unable to perform ROS: Mental status change    Physical Exam Vitals signs and nursing note reviewed.  Constitutional:      Appearance: He is ill-appearing.  HENT:     Head: Normocephalic and atraumatic.  Neck:     Musculoskeletal: Normal range of motion.  Cardiovascular:     Rate and Rhythm: Normal rate.  Skin:    General: Skin is warm and dry.     Coloration: Skin is pale.  Neurological:     Mental Status: He is alert.     Comments: Patient is alert but minimally conversant.  Will mostly answer brief questions, yes/no answers.  Unable to test orientation  Psychiatric:     Comments: No overt agitation otherwise unable to perform mental status exam     Vital  Signs: BP 112/63 (BP Location: Right Arm)   Pulse 90   Temp 98.9 F (37.2 C) (Oral)   Resp 20   Ht 6' (1.829 m)   Wt (!) 144.7 kg   SpO2 97%   BMI 43.26 kg/m  Pain Scale: Faces   Pain Score: 0-No pain   SpO2: SpO2: 97 % O2  Device:SpO2: 97 % O2 Flow Rate: .O2 Flow Rate (L/min): 6 L/min  IO: Intake/output summary:   Intake/Output Summary (Last 24 hours) at 10/12/2018 1344 Last data filed at 10/12/2018 1237 Gross per 24 hour  Intake 1080 ml  Output 800 ml  Net 280 ml    LBM: Last BM Date: 10/10/18 Baseline Weight: Weight: 107 kg Most recent weight: Weight: (!) 144.7 kg     Palliative Assessment/Data:   Flowsheet Rows     Most Recent Value  Intake Tab  Referral Department  Hospitalist  Unit at Time of Referral  Med/Surg Unit  Palliative Care Primary Diagnosis  Neurology  Date Notified  10/10/18  Palliative Care Type  Return patient Palliative Care  Reason for referral  Clarify Goals of Care, Psychosocial or Spiritual support  Date of Admission  10/09/18  Date first seen by Palliative Care  10/12/18  # of days Palliative referral response time  2 Day(s)  # of days IP prior to Palliative referral  1  Clinical Assessment  Palliative Performance Scale Score  40%  Pain Max last 24 hours  Not able to report  Pain Min Last 24 hours  Not able to report  Dyspnea Max Last 24 Hours  Not able to report  Dyspnea Min Last 24 hours  Not able to report  Nausea Max Last 24 Hours  Not able to report  Nausea Min Last 24 Hours  Not able to report  Anxiety Max Last 24 Hours  Not able to report  Anxiety Min Last 24 Hours  Not able to report  Other Max Last 24 Hours  Not able to report  Psychosocial & Spiritual Assessment  Palliative Care Outcomes  Patient/Family meeting held?  Yes  Who was at the meeting?  dtr via phone  Patient/Family wishes: Interventions discontinued/not started   Mechanical Ventilation, Trach, Hemodialysis  Palliative Care follow-up planned  Yes, Facility      Time In: 1230 Time Out: 1330 Time Total: 60 min Greater than 50%  of this time was spent counseling and coordinating care related to the above assessment and plan.  Signed by: Dory Horn, NP   Please contact  Palliative Medicine Team phone at [phone] for questions and concerns.  For individual provider: See Shea Evans

## 2018-10-12 NOTE — Progress Notes (Addendum)
PROGRESS NOTE    Billy [name]  DDU:[id number] DOB: May 15, 1935 DOA: 10/09/2018 PCP: Patient, No Pcp Per   Brief Narrative:  HPI per Dr. Jamesetta Geralds [name] is a 84 y.o. male with medical history significant of CKD stage 4, HTN, DM that appears diet controlled.  Late last month he was admitted for CHF due to severe AS, diuresed, noted to have somewhat worsening renal function with diuresis.  Cardiology didn't feel he would be a good candidate for intervention regarding his AS.  Nephrology likewise didn't think he would be a candidate at all for long term dialysis.  Pal care spoke with patient, he apparently made it clear he wasn't interested in long term dialysis or heroic measures as their 3/21 note discusses.  He was discharged home with Palliative / Hospice care.  Seen by cardiology for video office visit (due to current [place] pandemic), on 4/10.  Cardiologist noted that he was quite "sluggish", and noted "I am worried about potentially worsening of his kidney function with quite aggressive diuresis."  Chem 7 was ordered, obtained today.  And showed BUN in the 140s with creat of 3.5.  Patient sent in to ED.   ED Course: BUN 140, creat 3.5.  Bicarb 18.  K 5.7 (temp measures given, no EKG changes).  Patient with AMS.  Patient transferred to [facility].  Assessment & Plan:   Principal Problem:   Acute respiratory failure with hypoxia (HCC) Active Problems:   Acute renal failure superimposed on stage 4 chronic kidney disease (HCC)   HCAP (healthcare-associated pneumonia)   Pleural effusion   Type 2 diabetes mellitus (HCC)   Hyperlipidemia   Essential hypertension, benign   Severe aortic stenosis   Hypertension   Uremic encephalopathy   Cellulitis of scrotum   Anemia   Parapneumonic effusion   Pressure injury of skin  1 acute respiratory failure with hypoxia Patient noted to have increased O2 requirement since the evening of 10/10/2018.  Questionable etiology.  Some improvement with  hypoxia.  Likely multifactorial secondary to right lower lobe pneumonia and probable parapneumonic effusion in the setting of severe AS and concerns for possible volume overload.  Influenza PCR negative.  Patient with no cough.  ABG with ph 7.4, pco2 35 po2 58. Patient s/p ultrasound-guided thoracentesis with 1L bloody fluid removed and c/w exudate.  IV fluids have been saline locked.  Resume half home dose oral diuretics.  Continue IV vancomycin IV cefepime.  Patient seen in consultation by pulmonary who are recommending treat as a parapneumonic effusion with 10 to 14 days of antibiotics pending Gram stain and culture.  Supportive care.   2 Acute renal failure on chronic kidney disease stage IV Likely secondary to prerenal azotemia from aggressive diuresis.  It was noted from prior hospitalization that cardiology was concerned about this possibility as well as lethargy.  Patient did have a video visit 5 days prior to admission.  Patient noted not to be a candidate and does not want dialysis per palliative care notes and nephrology notes from last admission.  Hyperkalemia improved.  Renal function improving and creatinine down to 2.88 from 3.78 on admission.  BUN at 120 from 138 on admission.  Patient still with asterixis however mentation slowly improving.  Patient noted to be more hypoxic and due to history of severe AS. NSL IVF. CT abdomen and pelvis negative for hydronephrosis.  Foley catheter in place.  Resume 1/2 home dose diuretics. Follow.  3.  Acute metabolic encephalopathy likely secondary to uremic encephalopathy  Patient noted to have asterixis on examination.  Patient was confused on admission.  Patient on admission noted to have BUN of 138 with a creatinine of 3.78.  Renal function slowly trending down with BUN 120 and Cr 2.88.  Patient more alert today and less confused and less drowsy.  Due to hypoxia IV fluid have been saline locked.  Will resume half home dose diuretics and monitor renal  function closely. Follow.  4.  HCAP/probable parapneumonic effusion CT abdomen and pelvis noted a right lower lobe pneumonia as well as a pleural effusion.  Urine strep pneumococcus antigen pending.  Urine Legionella antigen pending.  Blood cultures pending with no growth to date. Patient with a leukocytosis with a white count of 20.2 is trending down and currently at 13.3.  IR for ultrasound-guided thoracentesis which was done 10/11/2018 with 1 L of bloody fluid removed.  Findings consistent with exudate.  CT chest which was done 10/12/2018 with multiple right-sided rib fractures, no pneumothorax.  Bilateral pleural effusions right greater than left with associated lower lobe consolidation right greater than left.  Dependent gallstones noted.  Patient seen by pulmonary who are recommending treating as a parapneumonic effusion for 10 to 14 days.  IV Rocephin discontinued and patient on IV cefepime and vancomycin.  If blood cultures remain negative tomorrow we will discontinue IV vancomycin.  Appreciate pulmonary's input and recommendations.  Follow.   5.  Iron deficiency anemia/anemia of chronic disease Patient with no overt bleeding.  Hemoglobin currently at 7.3 from 7.4.  Likely dilutional in nature.  Patient with no overt bleeding however patient noted to have a bloody exudate on thoracentesis.  Transfusion threshold hemoglobin less than 7.  Continue oral iron supplementation.  6.  Severe AS Patient deemed not a candidate for intervention per cardiologist last note from prior admission.  Patient with increased O2 requirements with hypoxia requiring 6 L nasal cannula on 10/11/2018.  O2 requirements improved with saline lock of IV fluids.  Resume half home dose Lasix. Follow.  7.  Bacteriuria/Pyuria Urine cultures with 20,000 coplonis of proteus penneri. Blood cultures pending.  IV Rocephin was changed to IV cefepime due to concerns for healthcare associated pneumonia parapneumonic effusion.  Follow.     8.  Cellulitis of the scrotum/Candida in the scrotum/MASD and wounds to perineum, buttocks Patient noted to have a candidal rash issues during last admission.  Patient with a leukocytosis with a white count of 20.2 which is trending down.Marland Kitchen  MRSA PCR positive.  CT abdomen and pelvis with no indication of Fournier's.  IV Rocephin has been changed to IV vancomycin IV cefepime as patient also noted to have a pneumonia.  Wound care consulted.  Continue nystatin topical powder.  Doubt if patient will be able to make it through surgery and subsequent ICU stay if indeed he has a Fournier's gangrene.  Continue current wound care as recommended per wound care RN.  9.  Hypertension Continue current regimen of antihypertensive medications.  Will resume half home dose diuretics.  10.  Chronic diastolic heart failure Patient with increased hypoxia early on in the hospitalization requiring 6 L nasal cannula on 10/11/2018.  Increase O2 requirements improving. Patient was on IV fluids for acute on chronic kidney disease.  Patient also noted to have a pneumonia.  Diuretics on hold.  IV fluids have been saline locked.  Renal function trending down.  Will resume half home dose diuretics.  Follow.   11. Multiple right sided Rib fractures Likely secondary to fall.  Supportive care    DVT prophylaxis: SCDs Code Status: DNR Family Communication: Updated patient.  Updated daughter via phone. Disposition Plan: To be determined.   Consultants:   Palliative care: Vinie Sill, NP 08/15/2540  Pulmonary: Dr. Elsworth Soho 10/11/2018  Procedures:  CT abdomen and pelvis 10/10/2018  US Guided Thoracentesis 10/11/2018--- 1 L of bloody fluid  Antimicrobials:   IV cefepime 10/10/2018  IV vancomycin 10/10/2018   Subjective: Patient somewhat alert however with some confusion noted.  Asterixis present.  Following commands.  Denies chest pain.  Increased O2 requirements improving and on 3-1/2 L nasal cannula.    Objective: Vitals:   10/11/18 0755 10/11/18 1600 10/12/18 0500 10/12/18 0927  BP: (!) 115/52 (!) 124/50  112/63  Pulse: 85 89  90  Resp: 20 20  20   Temp: 99 F (37.2 C) 98.4 F (36.9 C)  98.9 F (37.2 C)  TempSrc: Oral Oral  Oral  SpO2: 96% 95%  97%  Weight:   (!) 144.7 kg   Height:        Intake/Output Summary (Last 24 hours) at 10/12/2018 1026 Last data filed at 10/12/2018 0819 Gross per 24 hour  Intake 1020 ml  Output 1000 ml  Net 20 ml   Filed Weights   10/09/18 2300 10/10/18 2122 10/12/18 0500  Weight: 107 kg 106.3 kg (!) 144.7 kg    Examination:  General exam: NAD Respiratory system: Decreased breath sounds in the bases right greater than left.  No crackles noted.  No wheezing noted.  Some thoracoabdominal dyssynchrony.  Speaking in full sentences. Cardiovascular system: RRR with 3/6 SEM right upper sternal border, left upper sternal border, left lower sternal border. No JVD. No pedal edema. Gastrointestinal system: Abdomen is soft, nontender, nondistended, positive bowel sounds.  No rebound.  No guarding.  Central nervous system: Alert..  Moving extremities spontaneously.  Asterixis.  Extremities: Symmetric 5 x 5 power. Skin:           No rashes, lesions or ulcers  Psychiatry: Judgement and insight appear fair. Mood & affect appropriate.     Data Reviewed: I have personally reviewed following labs and imaging studies  CBC: Recent Labs  Lab 10/10/18 0014 10/11/18 0651 10/11/18 1441 10/12/18 0450  WBC 20.2* 12.8*  --  13.3*  NEUTROABS  --  11.3*  --  11.9*  HGB 8.2* 7.4* 7.9* 7.3*  HCT 27.2* 24.0* 25.6* 23.9*  MCV 86.3 86.6  --  87.2  PLT 234 155  --  706   Basic Metabolic Panel: Recent Labs  Lab 10/10/18 0014 10/10/18 0455 10/10/18 0743 10/10/18 1109 10/11/18 0651 10/12/18 0450  NA 136  --   --  138 139 139  K 5.1 4.7 4.4 4.6 3.9 3.7  CL 107  --   --  104 104 100  CO2 16*  --   --  20* 22 24  GLUCOSE 147*  --   --  137* 97 182*   BUN 138*  --   --  132* 121* 120*  CREATININE 3.78*  --   --  3.49* 3.03* 2.88*  CALCIUM 8.7*  --   --  8.5* 8.2* 8.3*  MG  --   --   --   --   --  2.7*  PHOS  --   --   --   --   --  4.3   GFR: Estimated Creatinine Clearance: 29.7 mL/min (A) (by C-G formula based on SCr of 2.88 mg/dL (H)). Liver Function Tests:  Recent Labs  Lab 10/10/18 1109 10/11/18 0651 10/12/18 0450  AST 41 35  --   ALT 24 25  --   ALKPHOS 77 84  --   BILITOT 0.8 1.0  --   PROT 5.5* 5.1*  --   ALBUMIN 2.3* 2.0* 1.9*   No results for input(s): LIPASE, AMYLASE in the last 168 hours. No results for input(s): AMMONIA in the last 168 hours. Coagulation Profile: No results for input(s): INR, PROTIME in the last 168 hours. Cardiac Enzymes: No results for input(s): CKTOTAL, CKMB, CKMBINDEX, TROPONINI in the last 168 hours. BNP (last 3 results) No results for input(s): PROBNP in the last 8760 hours. HbA1C: No results for input(s): HGBA1C in the last 72 hours. CBG: No results for input(s): GLUCAP in the last 168 hours. Lipid Profile: No results for input(s): CHOL, HDL, LDLCALC, TRIG, CHOLHDL, LDLDIRECT in the last 72 hours. Thyroid Function Tests: No results for input(s): TSH, T4TOTAL, FREET4, T3FREE, THYROIDAB in the last 72 hours. Anemia Panel: Recent Labs    10/10/18 1109 10/11/18 1441  VITAMINB12 568  --   FOLATE 26.7  --   FERRITIN 491* 705*  TIBC 161*  --   IRON 16*  --    Sepsis Labs: No results for input(s): PROCALCITON, LATICACIDVEN in the last 168 hours.  Recent Results (from the past 240 hour(s))  Culture, Urine     Status: Abnormal   Collection Time: 10/09/18 11:56 PM  Result Value Ref Range Status   Specimen Description URINE, RANDOM  Final   Special Requests   Final    NONE Performed at [facility], [address], [place], [place] [address]    Culture 20,000 COLONIES/mL PROTEUS PENNERI (A)  Final   Report Status 10/12/2018 FINAL  Final   Organism ID, Bacteria PROTEUS  PENNERI (A)  Final      Susceptibility   Proteus penneri - MIC*    AMPICILLIN >=32 RESISTANT Resistant     CEFAZOLIN >=64 RESISTANT Resistant     CEFTRIAXONE >=64 RESISTANT Resistant     CIPROFLOXACIN <=0.25 SENSITIVE Sensitive     GENTAMICIN <=1 SENSITIVE Sensitive     IMIPENEM 4 SENSITIVE Sensitive     NITROFURANTOIN 128 RESISTANT Resistant     TRIMETH/SULFA <=20 SENSITIVE Sensitive     AMPICILLIN/SULBACTAM >=32 RESISTANT Resistant     PIP/TAZO <=4 SENSITIVE Sensitive     * 20,000 COLONIES/mL PROTEUS PENNERI  Culture, blood (routine x 2)     Status: None (Preliminary result)   Collection Time: 10/10/18 12:15 AM  Result Value Ref Range Status   Specimen Description BLOOD RIGHT HAND  Final   Special Requests   Final    BOTTLES DRAWN AEROBIC AND ANAEROBIC Blood Culture adequate volume   Culture   Final    NO GROWTH 2 DAYS Performed at [facility], [address], [place], [place] [address]    Report Status PENDING  Incomplete  Culture, blood (routine x 2)     Status: None (Preliminary result)   Collection Time: 10/10/18 12:15 AM  Result Value Ref Range Status   Specimen Description BLOOD LEFT HAND  Final   Special Requests   Final    BOTTLES DRAWN AEROBIC ONLY Blood Culture results may not be optimal due to an excessive volume of blood received in culture bottles   Culture   Final    NO GROWTH 2 DAYS Performed at [facility], [place] [address], [place], [place] [address]  Report Status PENDING  Incomplete  MRSA PCR Screening     Status: Abnormal   Collection Time: 10/10/18  2:10 AM  Result Value Ref Range Status   MRSA by PCR POSITIVE (A) NEGATIVE Final    Comment:        The GeneXpert MRSA Assay (FDA approved for NASAL specimens only), is one component of a comprehensive MRSA colonization surveillance program. It is not intended to diagnose MRSA infection nor to guide or monitor treatment for MRSA infections. GENGLER,K RN 10/10/2018 AT 0354  SKEEN,P Performed at [facility], [place] [address] [place], [place] [address]   Gram stain     Status: None   Collection Time: 10/11/18  1:50 PM  Result Value Ref Range Status   Specimen Description FLUID PLEURAL RIGHT  Final   Special Requests NONE  Final   Gram Stain   Final    FEW WBC PRESENT,BOTH PMN AND MONONUCLEAR NO ORGANISMS SEEN Performed at [facility], [place] [address], [place], [place] [address]    Report Status 10/11/2018 FINAL  Final  Culture, body fluid-bottle     Status: None (Preliminary result)   Collection Time: 10/11/18  1:50 PM  Result Value Ref Range Status   Specimen Description FLUID PLEURAL RIGHT  Final   Special Requests BOTTLES DRAWN AEROBIC AND ANAEROBIC  Final   Culture   Final    NO GROWTH < 24 HOURS Performed at [facility], [place] [address] [place], [place] [address]    Report Status PENDING  Incomplete         Radiology Studies: Dg Chest 1 View  Result Date: 10/11/2018 CLINICAL DATA:  Status post thoracentesis EXAM: CHEST  1 VIEW COMPARISON:  10/11/2018 800 hours FINDINGS: There is no pneumothorax post thoracentesis. Right pleural effusion has improved. Bibasilar atelectasis persists. Low lung volumes. IMPRESSION: No pneumothorax post thoracentesis. Electronically Signed   By: Marybelle Killings M.D.   On: 10/11/2018 13:58   Ct Chest Wo Contrast  Result Date: 10/12/2018 CLINICAL DATA:  Hypoxia EXAM: CT CHEST WITHOUT CONTRAST TECHNIQUE: Multidetector CT imaging of the chest was performed following the standard protocol without IV contrast. COMPARISON:  10/11/2018 FINDINGS: Cardiovascular: Aortic calcifications are noted without aneurysmal dilatation. Coronary calcifications are seen. No enlargement of the pulmonary artery is noted. Mediastinum/Nodes: Thoracic inlet demonstrates changes consistent with goiter more prominent on the left than the right. Calcifications are noted within the thyroid gland bilaterally. No hilar or mediastinal  adenopathy is noted. The esophagus as visualized is within normal limits. Lungs/Pleura: Patchy infiltrates are noted worst in the lower lobes bilaterally with associated small effusion on the left and moderate effusion on the right. Upper Abdomen: Gallbladder is well distended with a dependent gallstone. The remainder of the upper abdomen is within normal limits. Musculoskeletal: Fractures of the seventh, eighth, ninth and tenth ribs on the right are noted. Degenerative changes of the thoracic spine are noted. IMPRESSION: Multiple right-sided rib fractures.  No pneumothorax is noted. Bilateral pleural effusions right considerably greater than left with associated lower lobe consolidation right greater than left. Dependent gallstone. Aortic Atherosclerosis (ICD10-I70.0). Electronically Signed   By: Inez Catalina M.D.   On: 10/12/2018 02:25   Dg Chest Port 1 View  Result Date: 10/11/2018 CLINICAL DATA:  Shortness of breath EXAM: PORTABLE CHEST 1 VIEW COMPARISON:  October 09, 2018 FINDINGS: There is a right pleural effusion. There is atelectatic change in the lung bases. Heart is upper normal in size with  pulmonary vascularity normal. No adenopathy. There is aortic atherosclerosis. No bone lesions. IMPRESSION: Right pleural effusion. Bibasilar atelectasis. It should be noted that a degree of consolidation on the right could be obscured by effusion. Stable cardiac silhouette. Aortic Atherosclerosis (ICD10-I70.0). Electronically Signed   By: Lowella Grip III M.D.   On: 10/11/2018 08:25   Ir Thoracentesis Asp Pleural Space W/img Guide  Result Date: 10/11/2018 INDICATION: Patient with right pleural effusion. Request is made for right diagnostic and therapeutic thoracentesis. EXAM: ULTRASOUND GUIDED RIGHT DIAGNOSTIC AND THERAPEUTIC THORACENTESIS MEDICATIONS: 10 mL 1% lidocaine COMPLICATIONS: None immediate. PROCEDURE: An ultrasound guided thoracentesis was thoroughly discussed with the patient and questions  answered. The benefits, risks, alternatives and complications were also discussed. The patient understands and wishes to proceed with the procedure. Written consent was obtained. Ultrasound was performed to localize and mark an adequate pocket of fluid in the right chest. The area was then prepped and draped in the normal sterile fashion. 1% Lidocaine was used for local anesthesia. Under ultrasound guidance a 6 Fr Safe-T-Centesis catheter was introduced. Thoracentesis was performed. The catheter was removed and a dressing applied. FINDINGS: A total of approximately 1.0 liters of bloody fluid was removed. Samples were sent to the laboratory as requested by the clinical team. IMPRESSION: Successful ultrasound guided diagnostic and therapeutic right thoracentesis yielding 1.0 of pleural fluid. Read by: Brynda Greathouse PA-C Electronically Signed   By: Markus Daft M.D.   On: 10/11/2018 14:02        Scheduled Meds:  aspirin EC  81 mg Oral Daily   atorvastatin  20 mg Oral Daily   bisacodyl  10 mg Rectal Once   bisoprolol  5 mg Oral Daily   buPROPion  150 mg Oral Daily   chlorhexidine  15 mL Mouth Rinse BID   Chlorhexidine Gluconate Cloth  6 each Topical Q0600   doxazosin  4 mg Oral Daily   escitalopram  10 mg Oral Daily   ferrous sulfate  325 mg Oral BID WC   fluticasone  2 spray Each Nare Daily   Gerhardt's butt cream   Topical QID   guaiFENesin  1,200 mg Oral BID   loratadine  10 mg Oral Daily   mouth rinse  15 mL Mouth Rinse q12n4p   multivitamin with minerals  1 tablet Oral Daily   mupirocin ointment  1 application Nasal BID   nystatin   Topical TID   pantoprazole  40 mg Oral Q0600   sodium chloride flush  3 mL Intravenous Q12H   Continuous Infusions:  ceFEPime (MAXIPIME) IV 2 g (10/12/18 0910)     LOS: 3 days    Time spent: 40 minutes    Irine Seal, MD [facility]  If 7PM-7AM, please contact night-coverage [email or web address] 10/12/2018, 10:26 AM

## 2018-10-13 LAB — CBC WITH DIFFERENTIAL/PLATELET
Abs Immature Granulocytes: 0.08 10*3/uL — ABNORMAL HIGH (ref 0.00–0.07)
Basophils Absolute: 0 10*3/uL (ref 0.0–0.1)
Basophils Relative: 0 %
Eosinophils Absolute: 0.1 10*3/uL (ref 0.0–0.5)
Eosinophils Relative: 1 %
HCT: 26.7 % — ABNORMAL LOW (ref 39.0–52.0)
Hemoglobin: 7.8 g/dL — ABNORMAL LOW (ref 13.0–17.0)
Immature Granulocytes: 1 %
Lymphocytes Relative: 7 %
Lymphs Abs: 0.7 10*3/uL (ref 0.7–4.0)
MCH: 25.9 pg — ABNORMAL LOW (ref 26.0–34.0)
MCHC: 29.2 g/dL — ABNORMAL LOW (ref 30.0–36.0)
MCV: 88.7 fL (ref 80.0–100.0)
Monocytes Absolute: 0.7 10*3/uL (ref 0.1–1.0)
Monocytes Relative: 7 %
Neutro Abs: 9.5 10*3/uL — ABNORMAL HIGH (ref 1.7–7.7)
Neutrophils Relative %: 84 %
Platelets: 134 10*3/uL — ABNORMAL LOW (ref 150–400)
RBC: 3.01 MIL/uL — ABNORMAL LOW (ref 4.22–5.81)
RDW: 18.9 % — ABNORMAL HIGH (ref 11.5–15.5)
WBC: 11.1 10*3/uL — ABNORMAL HIGH (ref 4.0–10.5)
nRBC: 0 % (ref 0.0–0.2)

## 2018-10-13 LAB — RENAL FUNCTION PANEL
Albumin: 1.9 g/dL — ABNORMAL LOW (ref 3.5–5.0)
Anion gap: 14 (ref 5–15)
BUN: 124 mg/dL — ABNORMAL HIGH (ref 8–23)
CO2: 22 mmol/L (ref 22–32)
Calcium: 8.1 mg/dL — ABNORMAL LOW (ref 8.9–10.3)
Chloride: 103 mmol/L (ref 98–111)
Creatinine, Ser: 3.15 mg/dL — ABNORMAL HIGH (ref 0.61–1.24)
GFR calc Af Amer: 20 mL/min — ABNORMAL LOW (ref >60)
GFR calc non Af Amer: 18 mL/min — ABNORMAL LOW (ref >60)
Glucose, Bld: 171 mg/dL — ABNORMAL HIGH (ref 70–99)
Phosphorus: 4.7 mg/dL — ABNORMAL HIGH (ref 2.5–4.6)
Potassium: 4.1 mmol/L (ref 3.5–5.1)
Sodium: 139 mmol/L (ref 135–145)

## 2018-10-13 MED ORDER — LORAZEPAM 2 MG/ML IJ SOLN
0.5000 mg | Freq: Four times a day (QID) | INTRAMUSCULAR | Status: DC | PRN
  Administered 2018-10-13 – 2018-10-15 (×6): 0.5 mg via INTRAVENOUS
  Filled 2018-10-13 (×6): qty 1

## 2018-10-13 MED ORDER — FUROSEMIDE 80 MG PO TABS: 80.0000 mg | ORAL_TABLET | Freq: Two times a day (BID) | ORAL | Status: DC

## 2018-10-13 MED ORDER — OLANZAPINE 5 MG PO TABS
2.5000 mg | ORAL_TABLET | Freq: Every day | ORAL | Status: DC
  Administered 2018-10-13 – 2018-10-14 (×2): 2.5 mg via ORAL
  Filled 2018-10-13 (×2): qty 1

## 2018-10-13 NOTE — Progress Notes (Signed)
PROGRESS NOTE    Billy [name]  PJA:[id number] DOB: [birth year] DOA: 10/09/2018 PCP: Patient, No Pcp Per   Brief Narrative:  HPI per Dr. Jamesetta Geralds [name] is a 84 y.o. male with medical history significant of CKD stage 4, HTN, DM that appears diet controlled.  Late last month he was admitted for CHF due to severe AS, diuresed, noted to have somewhat worsening renal function with diuresis.  Cardiology didn't feel he would be a good candidate for intervention regarding his AS.  Nephrology likewise didn't think he would be a candidate at all for long term dialysis.  Pal care spoke with patient, he apparently made it clear he wasn't interested in long term dialysis or heroic measures as their 3/21 note discusses.  He was discharged home with Palliative / Hospice care.  Seen by cardiology for video office visit (due to current [place] pandemic), on 4/10.  Cardiologist noted that he was quite "sluggish", and noted "I am worried about potentially worsening of his kidney function with quite aggressive diuresis."  Chem 7 was ordered, obtained today.  And showed BUN in the 140s with creat of 3.5.  Patient sent in to ED.   ED Course: BUN 140, creat 3.5.  Bicarb 18.  K 5.7 (temp measures given, no EKG changes).  Patient with AMS.  Patient transferred to [facility].  Assessment & Plan:   Principal Problem:   Acute respiratory failure with hypoxia (HCC) Active Problems:   Acute renal failure superimposed on stage 4 chronic kidney disease (HCC)   HCAP (healthcare-associated pneumonia)   Pleural effusion   Type 2 diabetes mellitus (HCC)   Hyperlipidemia   Essential hypertension, benign   Severe aortic stenosis   Hypertension   Uremic encephalopathy   Cellulitis of scrotum   Anemia   Parapneumonic effusion   Pressure injury of skin   Goals of care, counseling/discussion   Palliative care by specialist   Right-sided chest wall pain  1 acute respiratory failure with hypoxia Patient noted to have  increased O2 requirement since the evening of 10/10/2018.  Questionable etiology.  Some improvement with hypoxia.  Likely multifactorial secondary to right lower lobe pneumonia and probable parapneumonic effusion in the setting of severe AS and concerns for possible volume overload.  Influenza PCR negative.  Patient with no cough.  ABG with ph 7.4, pco2 35 po2 58. Patient s/p ultrasound-guided thoracentesis with 1L bloody fluid removed and c/w exudate.  IV fluids have been saline locked.  Hold diuretics today due to slight bump in creatinine.  Blood cultures with no growth to date.  Discontinue IV vancomycin.  Continue IV cefepime. Patient seen in consultation by pulmonary who are recommending treat as a parapneumonic effusion with 10 to 14 days of antibiotics pending Gram stain and culture.  Supportive care.   2 Acute renal failure on chronic kidney disease stage IV Likely secondary to prerenal azotemia from aggressive diuresis.  It was noted from prior hospitalization that cardiology was concerned about this possibility as well as lethargy.  Patient did have a video visit 5 days prior to admission.  Patient noted not to be a candidate and does not want dialysis per palliative care notes and nephrology notes from last admission.  Hyperkalemia improved.  Renal function was improving however slight bump in creatinine up to 3.15 today from 2.88 from 3.78 on admission.  BUN at 120 from 138 on admission.  Patient still with asterixis however mentation slowly improving.  Patient noted to be more hypoxic and due  to history of severe AS. NSL IVF. CT abdomen and pelvis negative for hydronephrosis.  Foley catheter in place.  Hold diuretics today due to bump in creatinine.  Follow.  3.  Acute metabolic encephalopathy likely secondary to uremic encephalopathy Patient noted to have asterixis on examination.  Patient was confused on admission.  Patient on admission noted to have BUN of 138 with a creatinine of 3.78.  Renal  function slowly trending down with BUN 120 and Cr 3.15.  Patient noted to be agitated overnight. Ativan prn. Due to hypoxia IV fluid have been saline locked.  Hold diuretics due to bump in creatinine. Follow.  4.  HCAP/probable parapneumonic effusion CT abdomen and pelvis noted a right lower lobe pneumonia as well as a pleural effusion.  Urine strep pneumococcus antigen negative.  Urine Legionella antigen pending.  Blood cultures pending with no growth to date. Patient with a leukocytosis with a white count of 20.2 is trending down and currently at 11.1.  IR for ultrasound-guided thoracentesis which was done 10/11/2018 with 1 L of bloody fluid removed.  Findings consistent with exudate.  CT chest which was done 10/12/2018 with multiple right-sided rib fractures, no pneumothorax.  Bilateral pleural effusions right greater than left with associated lower lobe consolidation right greater than left.  Dependent gallstones noted.  Patient seen by pulmonary who are recommending treating as a parapneumonic effusion for 10 to 14 days.  IV Rocephin discontinued and patient on IV cefepime and vancomycin.Blood cultures with no growth to date. D/C IV Vancomycin. Continue IV Cefepime.  Appreciate pulmonary's input and recommendations.  Follow.   5.  Iron deficiency anemia/anemia of chronic disease Patient with no overt bleeding.  Hemoglobin currently at 7.8 from 7.3 from 7.4.  Likely dilutional in nature.  Patient with no overt bleeding however patient noted to have a bloody exudate on thoracentesis.  Transfusion threshold hemoglobin less than 7.  Continue oral iron supplementation.  6.  Severe AS Patient deemed not a candidate for intervention per cardiologist last note from prior admission.  Patient with increased O2 requirements with hypoxia requiring 6 L nasal cannula on 10/11/2018.  O2 requirements improved with saline lock of IV fluids.  Due to slight bump in creatinine we will hold half home dose Lasix.  Follow.   7.  Bacteriuria/Pyuria Urine cultures with 20,000 coplonis of proteus penneri. Blood cultures pending with no growth to date..  IV Rocephin was changed to IV cefepime due to concerns for healthcare associated pneumonia parapneumonic effusion.  Follow.    8.  Cellulitis of the scrotum/Candida in the scrotum/MASD and wounds to perineum, buttocks, POA Patient noted to have a candidal rash issues during last admission.  Patient with a leukocytosis with a white count of 20.2 which is trending down.Marland Kitchen  MRSA PCR positive.  CT abdomen and pelvis with no indication of Fournier's.  Seems to slowly be improving.  IV Rocephin has been changed to IV vancomycin IV cefepime as patient also noted to have a pneumonia.  Wound care consulted.  Continue nystatin topical powder.  Doubt if patient will be able to make it through surgery and subsequent ICU stay if indeed he has a Fournier's gangrene.  Continue current wound care as recommended per wound care RN.  Discontinue IV vancomycin.  9.  Hypertension Continue current regimen of antihypertensive medications.  We will hold half home dose diuretics today due to bump in creatinine.  Follow.   10.  Chronic diastolic heart failure Patient with increased hypoxia early on  in the hospitalization requiring 6 L nasal cannula on 10/11/2018.  Increase O2 requirements improving. Patient was on IV fluids for acute on chronic kidney disease.  Patient also noted to have a pneumonia. IV fluids have been saline locked.  Patient was resumed at half home dose diuretics however slight bump in creatinine and as such we will hold diuretics today.  Follow.   11. Multiple right sided Rib fractures Likely secondary to fall. Supportive care.  Incentive spirometry.  12.  Agitation Likely secondary to acute on chronic kidney disease.  Place on IV Ativan as needed for agitation.    DVT prophylaxis: SCDs Code Status: DNR Family Communication: Updated patient.  Disposition Plan: To be  determined.   Consultants:   Palliative care: Vinie Sill, NP 1/69/6789  Pulmonary: Dr. Elsworth Soho 10/11/2018  Procedures:  CT abdomen and pelvis 10/10/2018  US Guided Thoracentesis 10/11/2018--- 1 L of bloody fluid  Antimicrobials:   IV cefepime 10/10/2018  IV vancomycin 10/10/2018>>>> 10/13/2018   Subjective: Patient noted to have some confusion and agitation overnight and placed in mittens.  Patient just received some Ativan and currently sleeping somewhat arousable however drifts back to sleep.  Patient denies any shortness of breath or chest pain.  Objective: Vitals:   10/12/18 1717 10/12/18 1933 10/13/18 0518 10/13/18 0955  BP:  (!) 112/44 (!) 116/52 (!) 110/46  Pulse:  87 76 72  Resp:  19 18 18   Temp:  99.3 F (37.4 C) 99.1 F (37.3 C) 98.2 F (36.8 C)  TempSrc:  Oral Oral Axillary  SpO2: 94% 100% 97% 96%  Weight:      Height:        Intake/Output Summary (Last 24 hours) at 10/13/2018 1043 Last data filed at 10/13/2018 0911 Gross per 24 hour  Intake 730 ml  Output 800 ml  Net -70 ml   Filed Weights   10/09/18 2300 10/10/18 2122 10/12/18 0500  Weight: 107 kg 106.3 kg (!) 144.7 kg    Examination:  General exam: NAD Respiratory system: Decreased breath sounds in the bases.  No crackles.  No wheezing.  Normal respiratory status.  Cardiovascular system: RRR with 3/6 SEM right upper sternal border, left upper sternal border, left lower sternal border. No JVD. No pedal edema. Gastrointestinal system: Abdomen is nontender, nondistended, soft, positive bowel sounds.  No rebound.  No guarding.  Central nervous system: Sleeping.  Moving extremities spontaneously.  Asterixis.  Extremities: Symmetric 5 x 5 power. Skin:           No rashes, lesions or ulcers  Psychiatry: Judgement and insight appear fair. Mood & affect appropriate.     Data Reviewed: I have personally reviewed following labs and imaging studies  CBC: Recent Labs  Lab 10/10/18 0014  10/11/18 0651 10/11/18 1441 10/12/18 0450 10/13/18 0612  WBC 20.2* 12.8*  --  13.3* 11.1*  NEUTROABS  --  11.3*  --  11.9* 9.5*  HGB 8.2* 7.4* 7.9* 7.3* 7.8*  HCT 27.2* 24.0* 25.6* 23.9* 26.7*  MCV 86.3 86.6  --  87.2 88.7  PLT 234 155  --  154 381*   Basic Metabolic Panel: Recent Labs  Lab 10/10/18 0014  10/10/18 0743 10/10/18 1109 10/11/18 0651 10/12/18 0450 10/13/18 0612  NA 136  --   --  138 139 139 139  K 5.1   < > 4.4 4.6 3.9 3.7 4.1  CL 107  --   --  104 104 100 103  CO2 16*  --   --  20* 22 24 22   GLUCOSE 147*  --   --  137* 97 182* 171*  BUN 138*  --   --  132* 121* 120* 124*  CREATININE 3.78*  --   --  3.49* 3.03* 2.88* 3.15*  CALCIUM 8.7*  --   --  8.5* 8.2* 8.3* 8.1*  MG  --   --   --   --   --  2.7*  --   PHOS  --   --   --   --   --  4.3 4.7*   < > = values in this interval not displayed.   GFR: Estimated Creatinine Clearance: 27.2 mL/min (A) (by C-G formula based on SCr of 3.15 mg/dL (H)). Liver Function Tests: Recent Labs  Lab 10/10/18 1109 10/11/18 0651 10/12/18 0450 10/13/18 0612  AST 41 35  --   --   ALT 24 25  --   --   ALKPHOS 77 84  --   --   BILITOT 0.8 1.0  --   --   PROT 5.5* 5.1*  --   --   ALBUMIN 2.3* 2.0* 1.9* 1.9*   No results for input(s): LIPASE, AMYLASE in the last 168 hours. No results for input(s): AMMONIA in the last 168 hours. Coagulation Profile: No results for input(s): INR, PROTIME in the last 168 hours. Cardiac Enzymes: No results for input(s): CKTOTAL, CKMB, CKMBINDEX, TROPONINI in the last 168 hours. BNP (last 3 results) No results for input(s): PROBNP in the last 8760 hours. HbA1C: No results for input(s): HGBA1C in the last 72 hours. CBG: No results for input(s): GLUCAP in the last 168 hours. Lipid Profile: No results for input(s): CHOL, HDL, LDLCALC, TRIG, CHOLHDL, LDLDIRECT in the last 72 hours. Thyroid Function Tests: No results for input(s): TSH, T4TOTAL, FREET4, T3FREE, THYROIDAB in the last 72 hours.  Anemia Panel: Recent Labs    10/10/18 1109 10/11/18 1441  VITAMINB12 568  --   FOLATE 26.7  --   FERRITIN 491* 705*  TIBC 161*  --   IRON 16*  --    Sepsis Labs: No results for input(s): PROCALCITON, LATICACIDVEN in the last 168 hours.  Recent Results (from the past 240 hour(s))  Culture, Urine     Status: Abnormal   Collection Time: 10/09/18 11:56 PM  Result Value Ref Range Status   Specimen Description URINE, RANDOM  Final   Special Requests   Final    NONE Performed at [facility], [address], [place], [place] [address]    Culture 20,000 COLONIES/mL PROTEUS PENNERI (A)  Final   Report Status 10/12/2018 FINAL  Final   Organism ID, Bacteria PROTEUS PENNERI (A)  Final      Susceptibility   Proteus penneri - MIC*    AMPICILLIN >=32 RESISTANT Resistant     CEFAZOLIN >=64 RESISTANT Resistant     CEFTRIAXONE >=64 RESISTANT Resistant     CIPROFLOXACIN <=0.25 SENSITIVE Sensitive     GENTAMICIN <=1 SENSITIVE Sensitive     IMIPENEM 4 SENSITIVE Sensitive     NITROFURANTOIN 128 RESISTANT Resistant     TRIMETH/SULFA <=20 SENSITIVE Sensitive     AMPICILLIN/SULBACTAM >=32 RESISTANT Resistant     PIP/TAZO <=4 SENSITIVE Sensitive     * 20,000 COLONIES/mL PROTEUS PENNERI  Culture, blood (routine x 2)     Status: None (Preliminary result)   Collection Time: 10/10/18 12:15 AM  Result Value Ref Range Status   Specimen Description BLOOD RIGHT HAND  Final  Special Requests   Final    BOTTLES DRAWN AEROBIC AND ANAEROBIC Blood Culture adequate volume   Culture   Final    NO GROWTH 3 DAYS Performed at [facility], [place] [address], [place], [place] [address]    Report Status PENDING  Incomplete  Culture, blood (routine x 2)     Status: None (Preliminary result)   Collection Time: 10/10/18 12:15 AM  Result Value Ref Range Status   Specimen Description BLOOD LEFT HAND  Final   Special Requests   Final    BOTTLES DRAWN AEROBIC ONLY Blood Culture results may not be  optimal due to an excessive volume of blood received in culture bottles   Culture   Final    NO GROWTH 3 DAYS Performed at [facility], [place] [address] [place], [place] [address]    Report Status PENDING  Incomplete  MRSA PCR Screening     Status: Abnormal   Collection Time: 10/10/18  2:10 AM  Result Value Ref Range Status   MRSA by PCR POSITIVE (A) NEGATIVE Final    Comment:        The GeneXpert MRSA Assay (FDA approved for NASAL specimens only), is one component of a comprehensive MRSA colonization surveillance program. It is not intended to diagnose MRSA infection nor to guide or monitor treatment for MRSA infections. GENGLER,K RN 10/10/2018 AT 0354 SKEEN,P Performed at [facility], [place] [address], [place], [place] [address]   Gram stain     Status: None   Collection Time: 10/11/18  1:50 PM  Result Value Ref Range Status   Specimen Description FLUID PLEURAL RIGHT  Final   Special Requests NONE  Final   Gram Stain   Final    FEW WBC PRESENT,BOTH PMN AND MONONUCLEAR NO ORGANISMS SEEN Performed at [facility], [place] [address], [place], [place] [address]    Report Status 10/11/2018 FINAL  Final  Culture, body fluid-bottle     Status: None (Preliminary result)   Collection Time: 10/11/18  1:50 PM  Result Value Ref Range Status   Specimen Description FLUID PLEURAL RIGHT  Final   Special Requests BOTTLES DRAWN AEROBIC AND ANAEROBIC  Final   Culture   Final    NO GROWTH 2 DAYS Performed at [facility], [place] [address], [place], [place] [address]    Report Status PENDING  Incomplete         Radiology Studies: Dg Chest 1 View  Result Date: 10/11/2018 CLINICAL DATA:  Status post thoracentesis EXAM: CHEST  1 VIEW COMPARISON:  10/11/2018 800 hours FINDINGS: There is no pneumothorax post thoracentesis. Right pleural effusion has improved. Bibasilar atelectasis persists. Low lung volumes. IMPRESSION: No pneumothorax post thoracentesis. Electronically  Signed   By: Marybelle Killings M.D.   On: 10/11/2018 13:58   Ct Chest Wo Contrast  Result Date: 10/12/2018 CLINICAL DATA:  Hypoxia EXAM: CT CHEST WITHOUT CONTRAST TECHNIQUE: Multidetector CT imaging of the chest was performed following the standard protocol without IV contrast. COMPARISON:  10/11/2018 FINDINGS: Cardiovascular: Aortic calcifications are noted without aneurysmal dilatation. Coronary calcifications are seen. No enlargement of the pulmonary artery is noted. Mediastinum/Nodes: Thoracic inlet demonstrates changes consistent with goiter more prominent on the left than the right. Calcifications are noted within the thyroid gland bilaterally. No hilar or mediastinal adenopathy is noted. The esophagus as visualized is within normal limits. Lungs/Pleura: Patchy infiltrates are noted worst in the lower lobes bilaterally with associated small effusion  on the left and moderate effusion on the right. Upper Abdomen: Gallbladder is well distended with a dependent gallstone. The remainder of the upper abdomen is within normal limits. Musculoskeletal: Fractures of the seventh, eighth, ninth and tenth ribs on the right are noted. Degenerative changes of the thoracic spine are noted. IMPRESSION: Multiple right-sided rib fractures.  No pneumothorax is noted. Bilateral pleural effusions right considerably greater than left with associated lower lobe consolidation right greater than left. Dependent gallstone. Aortic Atherosclerosis (ICD10-I70.0). Electronically Signed   By: Inez Catalina M.D.   On: 10/12/2018 02:25   Ir Thoracentesis Asp Pleural Space W/img Guide  Result Date: 10/11/2018 INDICATION: Patient with right pleural effusion. Request is made for right diagnostic and therapeutic thoracentesis. EXAM: ULTRASOUND GUIDED RIGHT DIAGNOSTIC AND THERAPEUTIC THORACENTESIS MEDICATIONS: 10 mL 1% lidocaine COMPLICATIONS: None immediate. PROCEDURE: An ultrasound guided thoracentesis was thoroughly discussed with the patient  and questions answered. The benefits, risks, alternatives and complications were also discussed. The patient understands and wishes to proceed with the procedure. Written consent was obtained. Ultrasound was performed to localize and mark an adequate pocket of fluid in the right chest. The area was then prepped and draped in the normal sterile fashion. 1% Lidocaine was used for local anesthesia. Under ultrasound guidance a 6 Fr Safe-T-Centesis catheter was introduced. Thoracentesis was performed. The catheter was removed and a dressing applied. FINDINGS: A total of approximately 1.0 liters of bloody fluid was removed. Samples were sent to the laboratory as requested by the clinical team. IMPRESSION: Successful ultrasound guided diagnostic and therapeutic right thoracentesis yielding 1.0 of pleural fluid. Read by: Brynda Greathouse PA-C Electronically Signed   By: Markus Daft M.D.   On: 10/11/2018 14:02        Scheduled Meds: . aspirin EC  81 mg Oral Daily  . atorvastatin  20 mg Oral Daily  . bisacodyl  10 mg Rectal Once  . bisoprolol  5 mg Oral Daily  . buPROPion  150 mg Oral Daily  . chlorhexidine  15 mL Mouth Rinse BID  . Chlorhexidine Gluconate Cloth  6 each Topical Q0600  . doxazosin  4 mg Oral Daily  . escitalopram  10 mg Oral Daily  . ferrous sulfate  325 mg Oral BID WC  . fluticasone  2 spray Each Nare Daily  . [START ON 10/14/2018] furosemide  80 mg Oral BID  . Gerhardt's butt cream   Topical QID  . guaiFENesin  1,200 mg Oral BID  . loratadine  10 mg Oral Daily  . mouth rinse  15 mL Mouth Rinse q12n4p  . multivitamin with minerals  1 tablet Oral Daily  . mupirocin ointment  1 application Nasal BID  . nystatin   Topical TID  . OLANZapine  2.5 mg Oral Daily  . pantoprazole  40 mg Oral Q0600  . sodium chloride flush  3 mL Intravenous Q12H   Continuous Infusions: . ceFEPime (MAXIPIME) IV 2 g (10/13/18 0849)     LOS: 4 days    Time spent: 40 minutes    Irine Seal, MD  [facility]  If 7PM-7AM, please contact night-coverage [email or web address] 10/13/2018, 10:43 AM

## 2018-10-13 NOTE — Progress Notes (Signed)
Pt agitated and aggressive with staff; pulling out his telemetry, and oxygen. Dr. Grandville Silos notified, orders given and implemented.  Left pt on RA, O2 sats in 90-92%. Will continue to monitor.

## 2018-10-14 LAB — RENAL FUNCTION PANEL
Albumin: 1.9 g/dL — ABNORMAL LOW (ref 3.5–5.0)
Anion gap: 15 (ref 5–15)
BUN: 126 mg/dL — ABNORMAL HIGH (ref 8–23)
CO2: 23 mmol/L (ref 22–32)
Calcium: 8.6 mg/dL — ABNORMAL LOW (ref 8.9–10.3)
Chloride: 105 mmol/L (ref 98–111)
Creatinine, Ser: 3.42 mg/dL — ABNORMAL HIGH (ref 0.61–1.24)
GFR calc Af Amer: 18 mL/min — ABNORMAL LOW (ref >60)
GFR calc non Af Amer: 16 mL/min — ABNORMAL LOW (ref >60)
Glucose, Bld: 168 mg/dL — ABNORMAL HIGH (ref 70–99)
Phosphorus: 5.1 mg/dL — ABNORMAL HIGH (ref 2.5–4.6)
Potassium: 4.1 mmol/L (ref 3.5–5.1)
Sodium: 143 mmol/L (ref 135–145)

## 2018-10-14 LAB — CBC
HCT: 26.7 % — ABNORMAL LOW (ref 39.0–52.0)
Hemoglobin: 7.8 g/dL — ABNORMAL LOW (ref 13.0–17.0)
MCH: 26.1 pg (ref 26.0–34.0)
MCHC: 29.2 g/dL — ABNORMAL LOW (ref 30.0–36.0)
MCV: 89.3 fL (ref 80.0–100.0)
Platelets: 185 10*3/uL (ref 150–400)
RBC: 2.99 MIL/uL — ABNORMAL LOW (ref 4.22–5.81)
RDW: 19.2 % — ABNORMAL HIGH (ref 11.5–15.5)
WBC: 10.8 10*3/uL — ABNORMAL HIGH (ref 4.0–10.5)
nRBC: 0 % (ref 0.0–0.2)

## 2018-10-14 LAB — SODIUM, URINE, RANDOM: Sodium, Ur: 20 mmol/L

## 2018-10-14 LAB — PH, BODY FLUID: pH, Body Fluid: 7.4

## 2018-10-14 LAB — CREATININE, URINE, RANDOM: Creatinine, Urine: 82.18 mg/dL

## 2018-10-14 LAB — LEGIONELLA PNEUMOPHILA SEROGP 1 UR AG: L. pneumophila Serogp 1 Ur Ag: NEGATIVE

## 2018-10-14 MED ORDER — LEVALBUTEROL HCL 0.63 MG/3ML IN NEBU
0.6300 mg | INHALATION_SOLUTION | Freq: Four times a day (QID) | RESPIRATORY_TRACT | Status: DC
  Administered 2018-10-14 – 2018-10-15 (×5): 0.63 mg via RESPIRATORY_TRACT
  Filled 2018-10-14 (×5): qty 3

## 2018-10-14 MED ORDER — LORATADINE 10 MG PO TABS
10.0000 mg | ORAL_TABLET | Freq: Every day | ORAL | Status: DC
  Administered 2018-10-15: 10 mg via ORAL
  Filled 2018-10-14: qty 1

## 2018-10-14 MED ORDER — METHYLPREDNISOLONE SODIUM SUCC 40 MG IJ SOLR
40.0000 mg | Freq: Two times a day (BID) | INTRAMUSCULAR | Status: DC
  Administered 2018-10-14 (×2): 40 mg via INTRAVENOUS
  Filled 2018-10-14 (×3): qty 1

## 2018-10-14 MED ORDER — SODIUM CHLORIDE 0.9 % IV SOLN
INTRAVENOUS | Status: DC
  Administered 2018-10-14: 08:00:00 via INTRAVENOUS

## 2018-10-14 MED ORDER — IPRATROPIUM BROMIDE 0.02 % IN SOLN
0.5000 mg | Freq: Four times a day (QID) | RESPIRATORY_TRACT | Status: DC
  Administered 2018-10-14 – 2018-10-15 (×5): 0.5 mg via RESPIRATORY_TRACT
  Filled 2018-10-14 (×5): qty 2.5

## 2018-10-14 MED ORDER — BUDESONIDE 0.5 MG/2ML IN SUSP
0.5000 mg | Freq: Two times a day (BID) | RESPIRATORY_TRACT | Status: DC
  Administered 2018-10-14 – 2018-10-16 (×4): 0.5 mg via RESPIRATORY_TRACT
  Filled 2018-10-14 (×4): qty 2

## 2018-10-14 NOTE — Progress Notes (Addendum)
PROGRESS NOTE    Billy [name]  OAC:[id number] DOB: 1934/06/27 DOA: 10/09/2018 PCP: Patient, No Pcp Per   Brief Narrative:  HPI per Dr. Jamesetta Geralds Standlee is a 84 y.o. male with medical history significant of CKD stage 4, HTN, DM that appears diet controlled.  Late last month he was admitted for CHF due to severe AS, diuresed, noted to have somewhat worsening renal function with diuresis.  Cardiology didn't feel he would be a good candidate for intervention regarding his AS.  Nephrology likewise didn't think he would be a candidate at all for long term dialysis.  Pal care spoke with patient, he apparently made it clear he wasn't interested in long term dialysis or heroic measures as their 3/21 note discusses.  He was discharged home with Palliative / Hospice care.  Seen by cardiology for video office visit (due to current [place] pandemic), on 4/10.  Cardiologist noted that he was quite "sluggish", and noted "I am worried about potentially worsening of his kidney function with quite aggressive diuresis."  Chem 7 was ordered, obtained today.  And showed BUN in the 140s with creat of 3.5.  Patient sent in to ED.   ED Course: BUN 140, creat 3.5.  Bicarb 18.  K 5.7 (temp measures given, no EKG changes).  Patient with AMS.  Patient transferred to [facility].  Assessment & Plan:   Principal Problem:   Acute respiratory failure with hypoxia (HCC) Active Problems:   Acute renal failure superimposed on stage 4 chronic kidney disease (HCC)   HCAP (healthcare-associated pneumonia)   Pleural effusion   Type 2 diabetes mellitus (HCC)   Hyperlipidemia   Essential hypertension, benign   Severe aortic stenosis   Hypertension   Uremic encephalopathy   Cellulitis of scrotum   Anemia   Parapneumonic effusion   Pressure injury of skin   Goals of care, counseling/discussion   Palliative care by specialist   Right-sided chest wall pain  1 acute respiratory failure with hypoxia Patient noted to have  increased O2 requirement since the evening of 10/10/2018.  Questionable etiology.  Some improvement with hypoxia.  Likely multifactorial secondary to right lower lobe pneumonia and probable parapneumonic effusion in the setting of severe AS and concerns for possible volume overload.  Influenza PCR negative.  Patient with no cough.  ABG with ph 7.4, pco2 35 po2 58. Patient s/p ultrasound-guided thoracentesis with 1L bloody fluid removed and c/w exudate.  IV fluids have been saline locked.  Continue to hold diuretics due to worsening renal function.  Blood cultures with no growth to date.  Patient with some wheezing noted on examination.  Placed on IV Solu-Medrol 40 mg every 12 hours, scheduled Xopenex and Atrovent nebs, Pulmicort nebs.  Discontinued IV vancomycin.  Continue IV cefepime. Patient seen in consultation by pulmonary who are recommending treat as a parapneumonic effusion with 10 to 14 days of antibiotics pending Gram stain and culture.  Supportive care.   2 Acute renal failure on chronic kidney disease stage IV Likely secondary to prerenal azotemia from aggressive diuresis.  It was noted from prior hospitalization that cardiology was concerned about this possibility as well as lethargy.  Patient did have a video visit 5 days prior to admission.  Patient noted not to be a candidate and does not want dialysis per palliative care notes and nephrology notes from last admission.  Hyperkalemia improved.  Renal function was initially improving however trending back up and currently at 3.42 from 3.15 from 2.88 from 3.78 on  admission.  BUN at 126 from 138 on admission.  Patient still with asterixis however mentation fluctuating.  Patient noted to be more hypoxic and due to history of severe AS.  IV fluids initially saline lock.  Continue to hold diuretics due to worsening renal function.  Placed on gentle hydration normal saline at 75 cc/h for the next 2 days. CT abdomen and pelvis negative for hydronephrosis.   Foley catheter in place.  Due to worsening renal function will consult with nephrology for further evaluation and management.  During last hospitalization it was deemed patient was not a candidate for hemodialysis and patient did not want to have hemodialysis.  Follow.   3.  Acute metabolic encephalopathy likely secondary to uremic encephalopathy Patient noted to have asterixis on examination.  Patient was confused on admission.  Patient on admission noted to have BUN of 138 with a creatinine of 3.78.  Renal function was initially trending down however trending back up.  Patient noted to be agitated yesterday given some Ativan as needed.  Patient also given IV Dilaudid overnight for agitation and drowsy this morning.  Discontinue IV Dilaudid.  Continue to hold diuretics.  Gentle hydration.  Follow.  4.  HCAP/probable parapneumonic effusion CT abdomen and pelvis noted a right lower lobe pneumonia as well as a pleural effusion.  Urine strep pneumococcus antigen negative.  Urine Legionella antigen pending.  Blood cultures pending with no growth to date. Patient with a leukocytosis with a white count of 20.2 is trending down and currently at 10.8.  IR for ultrasound-guided thoracentesis which was done 10/11/2018 with 1 L of bloody fluid removed.  Findings consistent with exudate.  CT chest which was done 10/12/2018 with multiple right-sided rib fractures, no pneumothorax.  Bilateral pleural effusions right greater than left with associated lower lobe consolidation right greater than left.  Dependent gallstones noted.  Patient seen by pulmonary who are recommending treating as a parapneumonic effusion for 10 to 14 days.  IV Rocephin and IV vancomycin discontinued and patient on IV cefepime. Blood cultures with no growth to date. Continue IV Cefepime.  Appreciate pulmonary's input and recommendations.  Follow.   5.  Iron deficiency anemia/anemia of chronic disease Patient with no overt bleeding.  Hemoglobin seems  to have stabilized around 7.8.  Likely dilutional in nature.  Patient with no overt bleeding.  Patient however noted to have bloody exudate on thoracentesis.  Transfusion threshold hemoglobin less than 7.  Continue oral iron supplementation.  6.  Severe AS Patient deemed not a candidate for intervention per cardiologist last note from prior admission.  Patient with increased O2 requirements with hypoxia requiring 6 L nasal cannula on 10/11/2018.  O2 requirements improved with saline lock of IV fluids.  Half home dose diuretics were held due to bump in creatinine.  Continue to hold diuretics as creatinine further increased today.  Gentle hydration with normal saline 75 cc an hour for the next 2 days.  Monitor closely for volume overload.   7.  Bacteriuria/Pyuria Urine cultures with 20,000 coplonis of proteus penneri. Blood cultures pending with no growth to date..  IV Rocephin was changed to IV cefepime due to concerns for healthcare associated pneumonia parapneumonic effusion.  Follow.    8.  Cellulitis of the scrotum/Candida in the scrotum/MASD and wounds to perineum, buttocks, POA Patient noted to have a candidal rash issues during last admission.  Patient with a leukocytosis with a white count of 20.2 which is trending down.Marland Kitchen  MRSA PCR positive.  CT abdomen and pelvis with no indication of Fournier's.  Clinically improving.  IV Rocephin has been changed to IV vancomycin IV cefepime as patient also noted to have a pneumonia.  Wound care consulted.  Continue nystatin topical powder.  Doubt if patient will be able to make it through surgery and subsequent ICU stay if indeed he has a Fournier's gangrene.  Continue current wound care as recommended per wound care RN.  Discontinued IV vancomycin.  9.  Hypertension Continue current regimen of antihypertensive medications.  Diuretics on hold due to worsening renal function.  Follow.   10.  Chronic diastolic heart failure Patient with increased hypoxia early  on in the hospitalization requiring 6 L nasal cannula on 10/11/2018.  Increase O2 requirements improving. Patient was on IV fluids for acute on chronic kidney disease.  Patient also noted to have a pneumonia. IV fluids have been saline locked.  Patient was resumed at half home dose diuretics however slight bump in creatinine and as such diet recs have been discontinued.  Gentle hydration.  Follow.   11. Multiple right sided Rib fractures Likely secondary to fall. Supportive care.  Incentive spirometry.  12.  Agitation Likely secondary to acute on chronic kidney disease. D/C Dilaudid. Continue  IV Ativan as needed for agitation.    DVT prophylaxis: SCDs Code Status: DNR Family Communication: Updated patient.  Disposition Plan: To be determined.   Consultants:   Palliative care: Vinie Sill, NP 1/61/0960  Pulmonary: Dr. Elsworth Soho 10/11/2018  Procedures:  CT abdomen and pelvis 10/10/2018  US Guided Thoracentesis 10/11/2018--- 1 L of bloody fluid  Antimicrobials:   IV cefepime 10/10/2018  IV vancomycin 10/10/2018>>>> 10/13/2018   Subjective: Patient drowsy.  Per RN patient given IV Dilaudid early on this morning for agitation.  Patient arousable denies any shortness of breath or chest pain.   Objective: Vitals:   10/13/18 1727 10/13/18 2038 10/14/18 0417 10/14/18 0952  BP: (!) 112/56 (!) 120/57 (!) 116/56 (!) 135/55  Pulse: 73 79 75 76  Resp:  18 18 18   Temp: (!) 97.4 F (36.3 C) 97.7 F (36.5 C) (!) 97.3 F (36.3 C) (!) 97.4 F (36.3 C)  TempSrc: Oral Oral Oral Oral  SpO2: (!) 87% (!) 81% 97% 98%  Weight:      Height:        Intake/Output Summary (Last 24 hours) at 10/14/2018 1026 Last data filed at 10/14/2018 0959 Gross per 24 hour  Intake 210 ml  Output 1200 ml  Net -990 ml   Filed Weights   10/09/18 2300 10/10/18 2122 10/12/18 0500  Weight: 107 kg 106.3 kg (!) 144.7 kg    Examination:  General exam: NAD Respiratory system: Expiratory wheezing diffusely.  No  crackles.  No rhonchi.  Decreased breath sounds in the bases.    Cardiovascular system: Regular rate and rhythm with 3/6 systolic ejection murmur right upper sternal border, left upper sternal border, left lower sternal border.  No JVD.  No edema.  Gastrointestinal system: Abdomen is soft, nontender, nondistended, positive bowel sounds.  No rebound.  No guarding.  Central nervous system: Drowsy. Moving extremities spontaneously.  Asterixis.  Extremities: Symmetric 5 x 5 power. Skin:           No rashes, lesions or ulcers  Psychiatry: Judgement and insight appear fair. Mood & affect appropriate.     Data Reviewed: I have personally reviewed following labs and imaging studies  CBC: Recent Labs  Lab 10/10/18 0014 10/11/18 0651 10/11/18 1441 10/12/18 0450  10/13/18 0612 10/14/18 0623  WBC 20.2* 12.8*  --  13.3* 11.1* 10.8*  NEUTROABS  --  11.3*  --  11.9* 9.5*  --   HGB 8.2* 7.4* 7.9* 7.3* 7.8* 7.8*  HCT 27.2* 24.0* 25.6* 23.9* 26.7* 26.7*  MCV 86.3 86.6  --  87.2 88.7 89.3  PLT 234 155  --  154 134* 825   Basic Metabolic Panel: Recent Labs  Lab 10/10/18 1109 10/11/18 0651 10/12/18 0450 10/13/18 0612 10/14/18 0623  NA 138 139 139 139 143  K 4.6 3.9 3.7 4.1 4.1  CL 104 104 100 103 105  CO2 20* 22 24 22 23   GLUCOSE 137* 97 182* 171* 168*  BUN 132* 121* 120* 124* 126*  CREATININE 3.49* 3.03* 2.88* 3.15* 3.42*  CALCIUM 8.5* 8.2* 8.3* 8.1* 8.6*  MG  --   --  2.7*  --   --   PHOS  --   --  4.3 4.7* 5.1*   GFR: Estimated Creatinine Clearance: 25 mL/min (A) (by C-G formula based on SCr of 3.42 mg/dL (H)). Liver Function Tests: Recent Labs  Lab 10/10/18 1109 10/11/18 0651 10/12/18 0450 10/13/18 0612 10/14/18 0623  AST 41 35  --   --   --   ALT 24 25  --   --   --   ALKPHOS 77 84  --   --   --   BILITOT 0.8 1.0  --   --   --   PROT 5.5* 5.1*  --   --   --   ALBUMIN 2.3* 2.0* 1.9* 1.9* 1.9*   No results for input(s): LIPASE, AMYLASE in the last 168  hours. No results for input(s): AMMONIA in the last 168 hours. Coagulation Profile: No results for input(s): INR, PROTIME in the last 168 hours. Cardiac Enzymes: No results for input(s): CKTOTAL, CKMB, CKMBINDEX, TROPONINI in the last 168 hours. BNP (last 3 results) No results for input(s): PROBNP in the last 8760 hours. HbA1C: No results for input(s): HGBA1C in the last 72 hours. CBG: No results for input(s): GLUCAP in the last 168 hours. Lipid Profile: No results for input(s): CHOL, HDL, LDLCALC, TRIG, CHOLHDL, LDLDIRECT in the last 72 hours. Thyroid Function Tests: No results for input(s): TSH, T4TOTAL, FREET4, T3FREE, THYROIDAB in the last 72 hours. Anemia Panel: Recent Labs    10/11/18 1441  FERRITIN 705*   Sepsis Labs: No results for input(s): PROCALCITON, LATICACIDVEN in the last 168 hours.  Recent Results (from the past 240 hour(s))  Culture, Urine     Status: Abnormal   Collection Time: 10/09/18 11:56 PM  Result Value Ref Range Status   Specimen Description URINE, RANDOM  Final   Special Requests   Final    NONE Performed at [facility], [address] [place], [place] [address]    Culture 20,000 COLONIES/mL PROTEUS PENNERI (A)  Final   Report Status 10/12/2018 FINAL  Final   Organism ID, Bacteria PROTEUS PENNERI (A)  Final      Susceptibility   Proteus penneri - MIC*    AMPICILLIN >=32 RESISTANT Resistant     CEFAZOLIN >=64 RESISTANT Resistant     CEFTRIAXONE >=64 RESISTANT Resistant     CIPROFLOXACIN <=0.25 SENSITIVE Sensitive     GENTAMICIN <=1 SENSITIVE Sensitive     IMIPENEM 4 SENSITIVE Sensitive     NITROFURANTOIN 128 RESISTANT Resistant     TRIMETH/SULFA <=20 SENSITIVE Sensitive     AMPICILLIN/SULBACTAM >=32 RESISTANT Resistant  PIP/TAZO <=4 SENSITIVE Sensitive     * 20,000 COLONIES/mL PROTEUS PENNERI  Culture, blood (routine x 2)     Status: None (Preliminary result)   Collection Time: 10/10/18 12:15 AM  Result Value Ref Range Status    Specimen Description BLOOD RIGHT HAND  Final   Special Requests   Final    BOTTLES DRAWN AEROBIC AND ANAEROBIC Blood Culture adequate volume   Culture   Final    NO GROWTH 3 DAYS Performed at [facility], [place] [address], [place], [place] [address]    Report Status PENDING  Incomplete  Culture, blood (routine x 2)     Status: None (Preliminary result)   Collection Time: 10/10/18 12:15 AM  Result Value Ref Range Status   Specimen Description BLOOD LEFT HAND  Final   Special Requests   Final    BOTTLES DRAWN AEROBIC ONLY Blood Culture results may not be optimal due to an excessive volume of blood received in culture bottles   Culture   Final    NO GROWTH 3 DAYS Performed at [facility], [place] [address], [place], [place] [address]    Report Status PENDING  Incomplete  MRSA PCR Screening     Status: Abnormal   Collection Time: 10/10/18  2:10 AM  Result Value Ref Range Status   MRSA by PCR POSITIVE (A) NEGATIVE Final    Comment:        The GeneXpert MRSA Assay (FDA approved for NASAL specimens only), is one component of a comprehensive MRSA colonization surveillance program. It is not intended to diagnose MRSA infection nor to guide or monitor treatment for MRSA infections. GENGLER,K RN 10/10/2018 AT 0354 SKEEN,P Performed at [facility], [place] [address], [place], [place] [address]   Gram stain     Status: None   Collection Time: 10/11/18  1:50 PM  Result Value Ref Range Status   Specimen Description FLUID PLEURAL RIGHT  Final   Special Requests NONE  Final   Gram Stain   Final    FEW WBC PRESENT,BOTH PMN AND MONONUCLEAR NO ORGANISMS SEEN Performed at [facility], [place] [address], [place], [place] [address]    Report Status 10/11/2018 FINAL  Final  Culture, body fluid-bottle     Status: None (Preliminary result)   Collection Time: 10/11/18  1:50 PM  Result Value Ref Range Status   Specimen Description FLUID PLEURAL RIGHT  Final   Special Requests  BOTTLES DRAWN AEROBIC AND ANAEROBIC  Final   Culture   Final    NO GROWTH 2 DAYS Performed at [facility], [place] [address], [place],  [address]    Report Status PENDING  Incomplete         Radiology Studies: No results found.      Scheduled Meds:  aspirin EC  81 mg Oral Daily   atorvastatin  20 mg Oral Daily   bisacodyl  10 mg Rectal Once   bisoprolol  5 mg Oral Daily   buPROPion  150 mg Oral Daily   chlorhexidine  15 mL Mouth Rinse BID   doxazosin  4 mg Oral Daily   escitalopram  10 mg Oral Daily   ferrous sulfate  325 mg Oral BID WC   fluticasone  2 spray Each Nare Daily   Gerhardt's butt cream   Topical QID   guaiFENesin  1,200 mg Oral BID   loratadine  10 mg Oral Daily   mouth rinse  15 mL Mouth Rinse q12n4p   multivitamin with minerals  1 tablet Oral Daily   mupirocin ointment  1 application Nasal BID   nystatin   Topical TID   OLANZapine  2.5 mg Oral Daily   pantoprazole  40 mg Oral Q0600   sodium chloride flush  3 mL Intravenous Q12H   Continuous Infusions:  sodium chloride 75 mL/hr at 10/14/18 0756   ceFEPime (MAXIPIME) IV 2 g (10/14/18 0942)     LOS: 5 days    Time spent: 40 minutes    Irine Seal, MD [facility]  If 7PM-7AM, please contact night-coverage [email or web address] 10/14/2018, 10:26 AM

## 2018-10-14 NOTE — Progress Notes (Signed)
Pt yelling since 1900 will not follow simple commands pt is agitated and continues to throw his legs over the rails trying to get up.pt put his legs in between the railings trying to get out of bed,pt is a high fall risk,their r no sitters available , pt is one on one, will continue to monitor

## 2018-10-14 NOTE — Care Management Important Message (Signed)
Important Message  Patient Details  Name: Billy [name] MRN: [id number] Date of Birth: Feb 22, 1935   Medicare Important Message Given:  Yes    Wyland Rastetter 10/14/2018, 3:28 PM

## 2018-10-14 NOTE — Evaluation (Signed)
Occupational Therapy Evaluation Patient Details Name: Billy [name] MRN: [id number] DOB: February 09, 1935 Today's Date: 10/14/2018    History of Present Illness PAtient is a 84 y/o male who presents from outpatient appt with AMS. Found to have acute on chronic ARF in setting of cardiorenal syndrome. s/p thoracentesis 4/17. Ct Abdomen- RLL airspace disease abd right pleural effusion. PMh includes HTN, HLD, DM, CKD stage IV, obesity.   Clinical Impression   Per chart review, pt was living with his wife and was performing BADLs and using SPC for mobility. Pt currently requiring Max A for ADLs and bed mobility due to poor cognition, balance, and strength. Pt restless and unconfortable throughout session stating "I am sick, help me." Pt sitting at EOB for ~10 minutes with Min A for support. SpO2 80% on RA upon arrival, and pt taking off O2 when placed back on. RN notified. Pt would benefit from further acute OT to facilitate safe dc. Recommend dc to SNF for further OT to optimize safety, independence with ADLs, and return to PLOF.      Follow Up Recommendations  SNF;Supervision/Assistance - 24 hour    Equipment Recommendations  Other (comment)(Defer to next venue)    Recommendations for Other Services PT consult     Precautions / Restrictions Precautions Precautions: Fall Precaution Comments: watch 02 Restrictions Weight Bearing Restrictions: No      Mobility Bed Mobility Overal bed mobility: Needs Assistance Bed Mobility: Rolling;Sidelying to Sit;Sit to Supine Rolling: Max assist;+2 for physical assistance Sidelying to sit: Max assist;+2 for physical assistance;HOB elevated   Sit to supine: Mod assist;+2 for physical assistance;HOB elevated   General bed mobility comments: Assist with trunk, scooting bottom and LEs to get to EOB; assist to bring LEs to return to supine and lower trunk.  Transfers                 General transfer comment: Not attempted secondary to safety  concerns, cognition andw eakness.     Balance Overall balance assessment: Needs assistance;History of Falls Sitting-balance support: Feet supported;Bilateral upper extremity supported Sitting balance-Leahy Scale: Poor Sitting balance - Comments: Requires BUE support sitting EOB, leaning on chair in front of pt, close min guard-Min A due to restlessness Postural control: (anterior lean)                                 ADL either performed or assessed with clinical judgement   ADL Overall ADL's : Needs assistance/impaired                                       General ADL Comments: Max A for ADLs and bed mobility due to confusion, poor cognition, and decreased strength and balance     Vision         Perception     Praxis      Pertinent Vitals/Pain Pain Assessment: Faces Faces Pain Scale: Hurts little more Pain Location: stomach or any movement Pain Descriptors / Indicators: Guarding;Grimacing;Moaning;Discomfort Pain Intervention(s): Monitored during session;Limited activity within patient's tolerance;Repositioned     Hand Dominance Right   Extremity/Trunk Assessment Upper Extremity Assessment Upper Extremity Assessment: Generalized weakness   Lower Extremity Assessment Lower Extremity Assessment: Defer to PT evaluation   Cervical / Trunk Assessment Cervical / Trunk Assessment: Kyphotic   Communication Communication Communication: No difficulties   Cognition Arousal/Alertness: Lethargic  Behavior During Therapy: Restless Overall Cognitive Status: Impaired/Different from baseline Area of Impairment: Orientation;Attention;Memory;Following commands;Awareness;Problem solving;Safety/judgement                 Orientation Level: Disoriented to;Time;Situation Current Attention Level: Selective;Sustained Memory: Decreased short-term memory Following Commands: Follows one step commands with increased time;Follows one step commands  inconsistently Safety/Judgement: Decreased awareness of safety;Decreased awareness of deficits Awareness: Intellectual Problem Solving: Requires verbal cues General Comments: "I am sick, I am sick, help me" "get me up, grab my hand" Very restless and uncomfortable. Pulling off 02 despite low levels, pulling gown over head, "i cannot breathe."   General Comments  Bruising present along right lateral trunk- per chart pt with fall in bathroom at home and broke some ribs. Sp02 at 80% on RA in room; donned 02 but pt keeps taking it off. RN notified.     Exercises     Shoulder Instructions      Home Living Family/patient expects to be discharged to:: Private residence Living Arrangements: Spouse/significant other Available Help at Discharge: Family;Available PRN/intermittently(daughter lives across the street per nurse) Type of Home: Mobile home Home Access: Ramped entrance     [place]: One level     Bathroom Shower/Tub: Walk-in shower     Bathroom Accessibility: No   Home Equipment: Environmental consultant - 2 wheels;Walker - 4 wheels;Cane - single point;Shower seat - built in;Grab bars - tub/shower;Grab bars - toilet   Additional Comments: Pt in unreliable historian, information taken from previous admission 1 month ago.      Prior Functioning/Environment Level of Independence: Needs assistance  Gait / Transfers Assistance Needed: uses SPC for mobility in the home ADL's / Homemaking Assistance Needed: family and aide were helping with cleaning and food   Comments: Pt in unreliable historian, information taken from previous admission 1 month ago.        OT Problem List: Decreased strength;Decreased range of motion;Impaired balance (sitting and/or standing);Decreased activity tolerance;Decreased cognition;Decreased safety awareness;Decreased knowledge of use of DME or AE;Decreased knowledge of precautions;Impaired UE functional use;Pain      OT Treatment/Interventions: Therapeutic  exercise;Self-care/ADL training;Energy conservation;DME and/or AE instruction;Therapeutic activities;Patient/family education    OT Goals(Current goals can be found in the care plan section) Acute Rehab OT Goals Patient Stated Goal: "help me" OT Goal Formulation: Patient unable to participate in goal setting Time For Goal Achievement: 10/28/18 Potential to Achieve Goals: Good ADL Goals Pt Will Perform Grooming: with set-up;with supervision;sitting Pt Will Perform Upper Body Dressing: with set-up;with supervision;sitting Pt Will Perform Lower Body Dressing: with mod assist;sit to/from stand Pt Will Transfer to Toilet: with mod assist;stand pivot transfer;bedside commode;with +2 assist Additional ADL Goal #1: Pt will sustain attention to simple ADLs with Min cues  OT Frequency: Min 2X/week   Barriers to D/C:            Co-evaluation PT/OT/SLP Co-Evaluation/Treatment: Yes Reason for Co-Treatment: Complexity of the patient's impairments (multi-system involvement);For patient/therapist safety;Necessary to address cognition/behavior during functional activity;To address functional/ADL transfers PT goals addressed during session: Mobility/safety with mobility;Balance;Strengthening/ROM OT goals addressed during session: ADL's and self-care      AM-PAC OT "6 Clicks" Daily Activity     Outcome Measure Help from another person eating meals?: Total Help from another person taking care of personal grooming?: A Lot Help from another person toileting, which includes using toliet, bedpan, or urinal?: Total Help from another person bathing (including washing, rinsing, drying)?: A Lot Help from another person to put on and taking off regular  upper body clothing?: A Lot Help from another person to put on and taking off regular lower body clothing?: Total 6 Click Score: 9   End of Session Equipment Utilized During Treatment: Oxygen Nurse Communication: Mobility status;Other (comment)(Pt taking O2  off)  Activity Tolerance: Patient tolerated treatment well Patient left: in bed;with call bell/phone within reach;with bed alarm set  OT Visit Diagnosis: Unsteadiness on feet (R26.81);Other abnormalities of gait and mobility (R26.89);Muscle weakness (generalized) (M62.81);Other symptoms and signs involving cognitive function;Pain Pain - part of body: (Stomach)                Time: 1155-2080 OT Time Calculation (min): 15 min Charges:  OT General Charges $OT Visit: 1 Visit OT Evaluation $OT Eval Moderate Complexity: [place], OTR/L Acute Rehab Pager: [phone] Office: [place] 10/14/2018, 4:43 PM

## 2018-10-14 NOTE — Evaluation (Signed)
Physical Therapy Evaluation Patient Details Name: Billy [name] MRN: [id number] DOB: Oct 28, 1934 Today's Date: 10/14/2018   History of Present Illness  PAtient is a 84 y/o male who presents from outpatient appt with AMS. Found to have acute on chronic ARF in setting of cardiorenal syndrome. s/p thoracentesis 4/17. Ct Abdomen- RLL airspace disease abd right pleural effusion. PMh includes HTN, HLD, DM, CKD stage IV, obesity.  Clinical Impression  Patient presents with generalized weakness, deconditioning, confusion, restlessness, pain and impaired mobility s/p above. Pt with impaired attention and awareness and only oriented to self and place. "I am sick, help me." Tolerated sitting EOB ~10 min with close Min guard-Min A for support secondary to unpredictability of movements/safety concerns and anterior lean on chair. Sp02 80% on RA upon arrival as pt continuously taking off 02. Rn aware. Pt not a great historian so not able to provide information however per nurse, pt lives at home with wife and was using [facility] for ambulation. Falls reported at home. Daughter lives across the street. Would benefit from SNF to maximize independence and mobility prior to return home.    Follow Up Recommendations SNF;Supervision for mobility/OOB;Supervision/Assistance - 24 hour    Equipment Recommendations  None recommended by PT    Recommendations for Other Services       Precautions / Restrictions Precautions Precautions: Fall Precaution Comments: watch 02 Restrictions Weight Bearing Restrictions: No      Mobility  Bed Mobility Overal bed mobility: Needs Assistance Bed Mobility: Rolling;Sidelying to Sit;Sit to Supine Rolling: Max assist;+2 for physical assistance Sidelying to sit: Max assist;+2 for physical assistance;HOB elevated   Sit to supine: Mod assist;+2 for physical assistance;HOB elevated   General bed mobility comments: Assist with trunk, scooting bottom and LEs to get to EOB; assist to bring  LEs to return to supine and lower trunk.  Transfers                 General transfer comment: Not attempted secondary to safety concerns, cognition andw eakness.   Ambulation/Gait                Stairs            Wheelchair Mobility    Modified Rankin (Stroke Patients Only)       Balance Overall balance assessment: Needs assistance;History of Falls Sitting-balance support: Feet supported;Bilateral upper extremity supported Sitting balance-Leahy Scale: Poor Sitting balance - Comments: Requires BUE support sitting EOB, leaning on chair in front of pt, close min guard-Min A due to restlessness Postural control: (anterior lean)                                   Pertinent Vitals/Pain Pain Assessment: Faces Faces Pain Scale: Hurts little more Pain Location: stomach or any movement Pain Descriptors / Indicators: Guarding;Grimacing;Moaning;Discomfort Pain Intervention(s): Monitored during session;Repositioned;Limited activity within patient's tolerance    Home Living Family/patient expects to be discharged to:: Private residence Living Arrangements: Spouse/significant other Available Help at Discharge: Family;Available PRN/intermittently(daughter lives across the street per nurse) Type of Home: Mobile home Home Access: [place]: One level Home Equipment: Environmental consultant - 2 wheels;Walker - 4 wheels;Cane - single point;Shower seat - built in;Grab bars - tub/shower;Grab bars - toilet      Prior Function Level of Independence: Needs assistance   Gait / Transfers Assistance Needed: uses SPC for mobility in the home  ADL's / Homemaking  Assistance Needed: family and aide were helping with cleaning and food  Comments: Pt in unreliable historian, information taken from previous admission 1 month ago.     Hand Dominance   Dominant Hand: Right    Extremity/Trunk Assessment   Upper Extremity Assessment Upper Extremity  Assessment: Defer to OT evaluation    Lower Extremity Assessment Lower Extremity Assessment: Generalized weakness(Pain with extending BLEs passively. Able to move LEs in bed but no lift against gravity. )    Cervical / Trunk Assessment Cervical / Trunk Assessment: Kyphotic  Communication   Communication: No difficulties  Cognition Arousal/Alertness: Lethargic Behavior During Therapy: Restless Overall Cognitive Status: Impaired/Different from baseline Area of Impairment: Orientation;Attention;Memory;Following commands;Awareness;Problem solving;Safety/judgement                 Orientation Level: Disoriented to;Time;Situation Current Attention Level: Selective;Sustained Memory: Decreased short-term memory Following Commands: Follows one step commands with increased time;Follows one step commands inconsistently Safety/Judgement: Decreased awareness of safety;Decreased awareness of deficits Awareness: Intellectual Problem Solving: Requires verbal cues General Comments: "I am sick, I am sick, help me" "get me up, grab my hand" Very restless and uncomfortable. Pulling off 02 despite low levels, pulling gown over head, "i cannot breathe."      General Comments General comments (skin integrity, edema, etc.): Bruising present along right lateral trunk- per chart pt with fall in bathroom at home and broke some ribs. Sp02 at 80% on RA in room; donned 02 but pt keeps taking it off. RN notified.     Exercises     Assessment/Plan    PT Assessment Patient needs continued PT services  PT Problem List Decreased strength;Decreased balance;Decreased cognition;Pain;Cardiopulmonary status limiting activity;Obesity;Decreased mobility;Decreased activity tolerance;Decreased safety awareness       PT Treatment Interventions Functional mobility training;Balance training;Patient/family education;Gait training;Therapeutic activities;Therapeutic exercise;Cognitive remediation;DME instruction    PT  Goals (Current goals can be found in the Care Plan section)  Acute Rehab PT Goals Patient Stated Goal: "help me" PT Goal Formulation: With patient Time For Goal Achievement: 10/28/18 Potential to Achieve Goals: Fair    Frequency Min 2X/week   Barriers to discharge Decreased caregiver support wife not able to care for pt at home per nurse report from talking with family    Co-evaluation PT/OT/SLP Co-Evaluation/Treatment: Yes Reason for Co-Treatment: Complexity of the patient's impairments (multi-system involvement);Necessary to address cognition/behavior during functional activity;For patient/therapist safety;To address functional/ADL transfers PT goals addressed during session: Mobility/safety with mobility;Balance;Strengthening/ROM         AM-PAC PT "6 Clicks" Mobility  Outcome Measure Help needed turning from your back to your side while in a flat bed without using bedrails?: A Lot Help needed moving from lying on your back to sitting on the side of a flat bed without using bedrails?: A Lot Help needed moving to and from a bed to a chair (including a wheelchair)?: A Lot Help needed standing up from a chair using your arms (e.g., wheelchair or bedside chair)?: A Lot Help needed to walk in hospital room?: A Lot Help needed climbing 3-5 steps with a railing? : Total 6 Click Score: 11    End of Session Equipment Utilized During Treatment: Oxygen Activity Tolerance: Patient limited by lethargy;Other (comment);Patient limited by pain(restlessness, cognition) Patient left: in bed;with call bell/phone within reach;with bed alarm set Nurse Communication: Mobility status PT Visit Diagnosis: Muscle weakness (generalized) (M62.81);Difficulty in walking, not elsewhere classified (R26.2);Pain Pain - part of body: (abdomen)    Time: 9937-1696 PT Time Calculation (min) (ACUTE ONLY):  21 min   Charges:   PT Evaluation $PT Eval Moderate Complexity: 1 Mod          Wray Kearns,  PT, DPT Acute Rehabilitation Services Pager [phone] Office [phone]      Lacie Draft 10/14/2018, 4:34 PM

## 2018-10-14 NOTE — Progress Notes (Addendum)
  Speech Language Pathology Treatment: Dysphagia  Patient Details Name: Billy [name] MRN: [id number] DOB: [birth year] Today's Date: 10/14/2018 Time: 4128-7867 SLP Time Calculation (min) (ACUTE ONLY): 10 min  Assessment / Plan / Recommendation Clinical Impression  Visible labored work of breathing, pt stating "I'm sick". Assisted to reposition and applies cold cloth to forehead; stating his stomach hurts. Discussed with RN pt's significant work of breathing (? fluid retention); he is not undergoing dialysis (plans are home with hospice/Palliative care). Agreeable to sips water consumed without overt s/s aspiration but risk is increased with current respiratory status. RN reported pt exhibited difficulty with mechanical soft solids but tolerated pudding with greater ease. Will downgrade to Dys 1 (puree), continue thin liquid. Prognosis for swallow appears poor-fair. Will follow up once more after downgrade to puree.    HPI HPI: Coal [name] is a 84 y.o. male with medical history significant of CKD stage 4, HTN, DM, HTN, CHF. Per chart, pt not a candidate for dialysis and Palliative care spoke with patient, he apparently made it clear he wasn't interested in long term dialysis or heroic measures as their 3/21 note discusses and discharged home with Palliative / Hospice care. CT abdomen noted right lower lobe airspace disease, moderate pleural effucion. Found to have AKF on CKD stage 4. NO prior ST documentation located.       SLP Plan  Continue with current plan of care       Recommendations  Diet recommendations: Dysphagia 1 (puree);Thin liquid Liquids provided via: Cup;Straw Medication Administration: Crushed with puree Supervision: Full supervision/cueing for compensatory strategies;Staff to assist with self feeding Compensations: Minimize environmental distractions;Slow rate;Small sips/bites Postural Changes and/or Swallow Maneuvers: Seated upright 90 degrees                Oral Care  Recommendations: Oral care BID Follow up Recommendations: 24 hour supervision/assistance SLP Visit Diagnosis: Dysphagia, oral phase (R13.11) Plan: Continue with current plan of care                      Houston Siren 10/14/2018, 3:35 PM  [name] Tery Hoeger M.Ed Risk analyst [phone] Office [phone]

## 2018-10-14 NOTE — Consult Note (Signed)
[facility] Renal Consultation Note  Requesting MD: Irine Seal MD Indication for Consultation:  AKI   Chief complaint: AKI   HPI: Billy [name] is a 84 y.o. male history of diabetes, hypertension, CKD stage IV with a baseline creatinine of 2.5-2.6 who presented after a follow-up video office visit with cardiology after a recent hospitalization.  He appeared sluggish and was sent for eval.  Palliative care had been consulted last admission as nephrology felt he was not a candidate for dialysis and cardiology felt he was not a candidate for intervention regarding aortic stenosis.  The patient was recently discharged from cardiology after an admission for overload.  Per the primary team initially he had been felt to be dehydrated and was hydrated and then resumed on half dose of his normal diuretics.  They have been stopped the diuretics because his renal function worsened and gave IV fluids.    Creat  Date/Time Value Ref Range Status  09/09/2013 10:40 AM 1.08 0.50 - 1.35 mg/dL Final   Creatinine, Ser  Date/Time Value Ref Range Status  10/14/2018 06:23 AM 3.42 (H) 0.61 - 1.24 mg/dL Final  10/13/2018 06:12 AM 3.15 (H) 0.61 - 1.24 mg/dL Final  10/12/2018 04:50 AM 2.88 (H) 0.61 - 1.24 mg/dL Final  10/11/2018 06:51 AM 3.03 (H) 0.61 - 1.24 mg/dL Final  10/10/2018 11:09 AM 3.49 (H) 0.61 - 1.24 mg/dL Final  10/10/2018 12:14 AM 3.78 (H) 0.61 - 1.24 mg/dL Final  09/17/2018 09:36 AM 2.72 (H) 0.61 - 1.24 mg/dL Final  09/16/2018 04:29 AM 2.77 (H) 0.61 - 1.24 mg/dL Final  09/15/2018 04:44 AM 2.72 (H) 0.61 - 1.24 mg/dL Final  09/14/2018 03:02 AM 2.92 (H) 0.61 - 1.24 mg/dL Final  09/13/2018 05:08 AM 2.85 (H) 0.61 - 1.24 mg/dL Final  09/12/2018 04:45 AM 2.91 (H) 0.61 - 1.24 mg/dL Final  09/11/2018 08:15 PM 2.95 (H) 0.61 - 1.24 mg/dL Final  01/30/2014 04:09 PM 1.8 (H) 0.4 - 1.5 mg/dL Final  12/11/2013 09:22 AM 1.2 0.4 - 1.5 mg/dL Final     PMHx:   Past Medical History:   Diagnosis Date  . CKD (chronic kidney disease), stage IV ([place])   . Diabetes mellitus without complication ([place])   . Hypertension   . Mixed hyperlipidemia   . Morbid obesity ([place])   . Severe aortic stenosis    a. 09/09/2018 [facility]): EF 55-60%, mod conc LVH. Nl RV fxn. Sev dil RA w/ elevated LA pressure. Mod RAE. Sev AS w/ peak grad of 58mmHg, mean of 18mmHg. AoV area 0.7cm. Mod TR. RVSP 13mmHg.    Past Surgical History:  Procedure Laterality Date  . IR THORACENTESIS ASP PLEURAL SPACE W/IMG GUIDE  10/11/2018  . NO PAST SURGERIES      Family Hx: History reviewed. No pertinent family history.  Social History:  reports that he has quit smoking. His smokeless tobacco use includes chew. He reports previous alcohol use. He reports that he does not use drugs.  Allergies: No Known Allergies  Medications: Prior to Admission medications   Medication Sig Start Date End Date Taking? Authorizing Provider  amLODipine (NORVASC) 5 MG tablet Take 1 tablet (5 mg total) by mouth daily. 09/17/18  Yes Darreld Mclean, PA-C  aspirin EC 81 MG tablet Take 81 mg by mouth daily.   Yes [provider]  atorvastatin (LIPITOR) 20 MG tablet Take 1 tablet (20 mg total) by mouth daily. 01/12/14  Yes Elayne Snare, MD  bisoprolol (ZEBETA) 5 MG tablet Take 1  tablet (5 mg total) by mouth daily. 09/17/18  Yes Sande Rives E, PA-C  buPROPion (WELLBUTRIN XL) 150 MG 24 hr tablet Take 1 tablet (150 mg total) by mouth daily. 01/08/14  Yes Elayne Snare, MD  escitalopram (LEXAPRO) 10 MG tablet TAKE ONE TABLET BY MOUTH ONCE DAILY 01/08/14  Yes Elayne Snare, MD  ferrous sulfate 325 (65 FE) MG tablet Take 1 tablet (325 mg total) by mouth 2 (two) times daily with a meal. 09/17/18  Yes Sande Rives E, PA-C  furosemide (LASIX) 80 MG tablet Take 2 tablets (160 mg total) by mouth 2 (two) times daily. 09/17/18  Yes Sande Rives E, PA-C  glucose blood (ACCU-CHEK AVIVA) test strip Use as instructed to check blood  sugar 1 time per day dx code 250.00 01/08/14  Yes Elayne Snare, MD  Multiple Vitamin (MULTIVITAMIN WITH MINERALS) TABS tablet Take 1 tablet by mouth daily.   Yes [provider]  Multiple Vitamin (MULTIVITAMIN) capsule Take 1 capsule by mouth daily.   Yes [provider]  nystatin cream (MYCOSTATIN) Apply topically 2 (two) times daily. Apply 2 times daily to red, moist area in bilateral groin/ingluinal folds until healing is complete. 09/17/18  Yes Sande Rives E, PA-C  potassium chloride SA (K-DUR,KLOR-CON) 20 MEQ tablet Take 1 tablet (20 mEq total) by mouth 2 (two) times daily. 09/17/18  Yes Sande Rives E, PA-C  doxazosin (CARDURA) 4 MG tablet TAKE ONE TABLET BY MOUTH ONCE DAILY Patient not taking: Reported on 10/11/2018 04/15/14   Elayne Snare, MD    I have reviewed the patient's current medications.  Labs:  BMP Latest Ref Rng & Units 10/14/2018 10/13/2018 10/12/2018  Glucose 70 - 99 mg/dL 168(H) 171(H) 182(H)  BUN 8 - 23 mg/dL 126(H) 124(H) 120(H)  Creatinine 0.61 - 1.24 mg/dL 3.42(H) 3.15(H) 2.88(H)  Sodium 135 - 145 mmol/L 143 139 139  Potassium 3.5 - 5.1 mmol/L 4.1 4.1 3.7  Chloride 98 - 111 mmol/L 105 103 100  CO2 22 - 32 mmol/L 23 22 24   Calcium 8.9 - 10.3 mg/dL 8.6(L) 8.1(L) 8.3(L)    Urinalysis    Component Value Date/Time   COLORURINE YELLOW 10/10/2018 0219   APPEARANCEUR TURBID (A) 10/10/2018 0219   LABSPEC 1.009 10/10/2018 0219   PHURINE 5.0 10/10/2018 0219   GLUCOSEU NEGATIVE 10/10/2018 0219   GLUCOSEU NEGATIVE 07/22/2013 1148   HGBUR MODERATE (A) 10/10/2018 0219   BILIRUBINUR NEGATIVE 10/10/2018 0219   KETONESUR NEGATIVE 10/10/2018 0219   PROTEINUR 30 (A) 10/10/2018 0219   UROBILINOGEN 0.2 07/22/2013 1148   NITRITE NEGATIVE 10/10/2018 0219   LEUKOCYTESUR LARGE (A) 10/10/2018 0219     ROS: Unable To obtain secondary to altered mental status   Physical Exam: Vitals:   10/14/18 0952 10/14/18 1740  BP: (!) 135/55 114/70  Pulse: 76 86   Resp: 18 18  Temp: (!) 97.4 F (36.3 C) 97.6 F (36.4 C)  SpO2: 98% 94%     General: Chronically ill-appearing elderly male; drowsy  HEENT: Normocephalic atraumatic Heart: S1-S2 no rub appreciated Lungs: Clear anteriorly on exam normal work of breathing at rest Abdomen: Softly distended/nontender normal bowel sounds Extremities: No pitting edema appreciated lower extremities Skin: Reviewed skin images included with primary team exam Neuro: Does not follow commands or respond to questions Psych: No agitation GU: foley   Assessment/Plan:  # AKI; non-oliguric - s/p prerenal insults with optimizing volume status in the setting of CHF and severe aortic stenosis -Continue supportive care - noted s/p cautious IV fluids.  Would discontinue and reassess  - Previously felt to be a poor long-term dialysis candidate and I agree   # CKD stage IV  - Baseline Cr reported as 2.5  # Severe AS  -Not felt to be a candidate for intervention per cardiology  # Acute hypoxic respiratory failure  - Continue supportive care with oxygen - s/p cautious hydration earlier today   # Chronic diastolic CHF  - s/p IV fluids  - Currently off of diuretics   # HTN  - acceptable  # Anemia  - Secondary in part to CKD  - no acute indication for transfusion   Agree with goals of care discussions   Claudia Desanctis 10/14/2018, 6:18 PM

## 2018-10-15 LAB — RENAL FUNCTION PANEL
Albumin: 2 g/dL — ABNORMAL LOW (ref 3.5–5.0)
Anion gap: 18 — ABNORMAL HIGH (ref 5–15)
BUN: 131 mg/dL — ABNORMAL HIGH (ref 8–23)
CO2: 19 mmol/L — ABNORMAL LOW (ref 22–32)
Calcium: 8.8 mg/dL — ABNORMAL LOW (ref 8.9–10.3)
Chloride: 107 mmol/L (ref 98–111)
Creatinine, Ser: 3.42 mg/dL — ABNORMAL HIGH (ref 0.61–1.24)
GFR calc Af Amer: 18 mL/min — ABNORMAL LOW (ref >60)
GFR calc non Af Amer: 16 mL/min — ABNORMAL LOW (ref >60)
Glucose, Bld: 255 mg/dL — ABNORMAL HIGH (ref 70–99)
Phosphorus: 5.6 mg/dL — ABNORMAL HIGH (ref 2.5–4.6)
Potassium: 4.8 mmol/L (ref 3.5–5.1)
Sodium: 144 mmol/L (ref 135–145)

## 2018-10-15 LAB — CBC WITH DIFFERENTIAL/PLATELET
Abs Immature Granulocytes: 0.09 10*3/uL — ABNORMAL HIGH (ref 0.00–0.07)
Basophils Absolute: 0 10*3/uL (ref 0.0–0.1)
Basophils Relative: 0 %
Eosinophils Absolute: 0 10*3/uL (ref 0.0–0.5)
Eosinophils Relative: 0 %
HCT: 27.8 % — ABNORMAL LOW (ref 39.0–52.0)
Hemoglobin: 8 g/dL — ABNORMAL LOW (ref 13.0–17.0)
Immature Granulocytes: 1 %
Lymphocytes Relative: 2 %
Lymphs Abs: 0.2 10*3/uL — ABNORMAL LOW (ref 0.7–4.0)
MCH: 26.2 pg (ref 26.0–34.0)
MCHC: 28.8 g/dL — ABNORMAL LOW (ref 30.0–36.0)
MCV: 91.1 fL (ref 80.0–100.0)
Monocytes Absolute: 0.4 10*3/uL (ref 0.1–1.0)
Monocytes Relative: 3 %
Neutro Abs: 11.1 10*3/uL — ABNORMAL HIGH (ref 1.7–7.7)
Neutrophils Relative %: 94 %
Platelets: 195 10*3/uL (ref 150–400)
RBC: 3.05 MIL/uL — ABNORMAL LOW (ref 4.22–5.81)
RDW: 19.4 % — ABNORMAL HIGH (ref 11.5–15.5)
WBC: 11.7 10*3/uL — ABNORMAL HIGH (ref 4.0–10.5)
nRBC: 0 % (ref 0.0–0.2)

## 2018-10-15 LAB — CULTURE, BLOOD (ROUTINE X 2)
Culture: NO GROWTH
Culture: NO GROWTH
Special Requests: ADEQUATE

## 2018-10-15 LAB — AMMONIA: Ammonia: 24 umol/L (ref 9–35)

## 2018-10-15 MED ORDER — BUPROPION HCL 75 MG PO TABS
75.0000 mg | ORAL_TABLET | Freq: Two times a day (BID) | ORAL | Status: DC
  Administered 2018-10-15 (×2): 75 mg via ORAL
  Filled 2018-10-15 (×3): qty 1

## 2018-10-15 MED ORDER — LEVALBUTEROL HCL 0.63 MG/3ML IN NEBU
0.6300 mg | INHALATION_SOLUTION | Freq: Three times a day (TID) | RESPIRATORY_TRACT | Status: DC
  Administered 2018-10-16: 0.63 mg via RESPIRATORY_TRACT
  Filled 2018-10-15: qty 3

## 2018-10-15 MED ORDER — OLANZAPINE 5 MG PO TABS
2.5000 mg | ORAL_TABLET | Freq: Two times a day (BID) | ORAL | Status: DC
  Administered 2018-10-15 – 2018-10-16 (×3): 2.5 mg via ORAL
  Filled 2018-10-15 (×3): qty 1

## 2018-10-15 MED ORDER — IPRATROPIUM BROMIDE 0.02 % IN SOLN
0.5000 mg | Freq: Three times a day (TID) | RESPIRATORY_TRACT | Status: DC
  Administered 2018-10-16: 0.5 mg via RESPIRATORY_TRACT
  Filled 2018-10-15: qty 2.5

## 2018-10-15 MED ORDER — GUAIFENESIN 200 MG PO TABS
400.0000 mg | ORAL_TABLET | ORAL | Status: DC
  Administered 2018-10-15 (×2): 400 mg via ORAL
  Filled 2018-10-15 (×9): qty 2

## 2018-10-15 MED ORDER — SODIUM CHLORIDE 0.9 % IV SOLN
INTRAVENOUS | Status: AC
  Administered 2018-10-15: 16:00:00 via INTRAVENOUS

## 2018-10-15 MED ORDER — ASPIRIN 81 MG PO CHEW
81.0000 mg | CHEWABLE_TABLET | Freq: Every day | ORAL | Status: DC
  Administered 2018-10-15 – 2018-10-16 (×2): 81 mg via ORAL
  Filled 2018-10-15 (×2): qty 1

## 2018-10-15 MED ORDER — PANTOPRAZOLE SODIUM 40 MG PO PACK
40.0000 mg | PACK | Freq: Every day | ORAL | Status: DC
  Administered 2018-10-15 – 2018-10-16 (×2): 40 mg
  Filled 2018-10-15 (×2): qty 20

## 2018-10-15 NOTE — Progress Notes (Addendum)
[facility] Progress Note  Name: Billy [name] MRN: [id number] DOB: [birth year]  Chief Complaint:  Altered mental status  Subjective:  Seen and evaluated.  Spoke with nursing at bedside and primary team via phone.  Patient altered and does not provide any history.  Called daughters via phone with no answer and recording that no voicemail box was set up.   Review of systems:  unable to obtain 2/2 AMS ----------------- Background on consult:  Billy [name] is a 84 y.o. male history of diabetes, hypertension, CKD stage IV with a baseline creatinine of 2.5-2.6 who presented after a follow-up video office visit with cardiology after a recent hospitalization.  He appeared sluggish and was sent for eval.  Palliative care had been consulted last admission as nephrology felt he was not a candidate for dialysis and cardiology felt he was not a candidate for intervention regarding aortic stenosis.  The patient was recently discharged from cardiology after an admission for overload.  Per the primary team initially he had been felt to be dehydrated and was hydrated and then resumed on half dose of his normal diuretics.  They have stopped the diuretics because his renal function worsened and gave IV fluids.  CT abdomen/pelvis with no renal obstruction and bilateral renal atrophy with a 5.7 cm mass concerning for possible renal cell.    Intake/Output Summary (Last 24 hours) at 10/15/2018 1450 Last data filed at 10/15/2018 1300 Gross per 24 hour  Intake 3 ml  Output 650 ml  Net -647 ml    Vitals:  Vitals:   10/14/18 0952 10/14/18 1740 10/14/18 2130 10/15/18 0921  BP: (!) 135/55 114/70 121/65   Pulse: 76 86 96   Resp: 18 18 18 20   Temp: (!) 97.4 F (36.3 C) 97.6 F (36.4 C) 98 F (36.7 C)   TempSrc: Oral Oral    SpO2: 98% 94% 93%   Weight:      Height:         Physical Exam:  General chronically ill elderly male Neck supple trachea midline Lungs clear to auscultation  bilaterally normal work of breathing at rest on my exam  Heart S1S2; no rub Abdomen soft nontender nondistended Extremities no pitting edema Nonverbal    Medications reviewed   Labs:  BMP Latest Ref Rng & Units 10/15/2018 10/14/2018 10/13/2018  Glucose 70 - 99 mg/dL 255(H) 168(H) 171(H)  BUN 8 - 23 mg/dL 131(H) 126(H) 124(H)  Creatinine 0.61 - 1.24 mg/dL 3.42(H) 3.42(H) 3.15(H)  Sodium 135 - 145 mmol/L 144 143 139  Potassium 3.5 - 5.1 mmol/L 4.8 4.1 4.1  Chloride 98 - 111 mmol/L 107 105 103  CO2 22 - 32 mmol/L 19(L) 23 22  Calcium 8.9 - 10.3 mg/dL 8.8(L) 8.6(L) 8.1(L)     Assessment/Plan:  # AKI; non-oliguric - s/p prerenal insults with optimizing volume status in the setting of CHF and severe aortic stenosis -Continue supportive care - normal saline at 75 ml/hr x 500 mL total - Previously felt to be a poor long-term dialysis candidate and I agree.  Goals of care discussions   # CKD stage IV  - Baseline Cr reported as 2.5  # Severe AS  -Not felt to be a candidate for intervention per cardiology  # Acute hypoxic respiratory failure  - Continue supportive care with oxygen - s/p steroids per primary team   # Chronic diastolic CHF  - caution with IV fluids  - Currently off of diuretics   # Renal mass - noted  on CT a/p - Defer additional imaging at this time   # HTN  - acceptable  # Anemia  - Secondary in part to CKD  - no acute indication for transfusion    Spoke with patient's daughter Billy [name] to update her at 7:45 pm.  Explained recommendations for supportive care from renal standpoint.   Claudia Desanctis, MD 10/15/2018 2:50 PM

## 2018-10-15 NOTE — Progress Notes (Addendum)
Initial Nutrition Assessment  DOCUMENTATION CODES:   Not applicable  INTERVENTION:   Recommend addressing nutrition support in goals of care as pt is at risk for malnutrition.   Assistance required with all meals.    Magic cup TID with meals, each supplement provides 290 kcal and 9 grams of protein  MVI with minerals  NUTRITION DIAGNOSIS:   Inadequate oral intake related to lethargy/confusion as evidenced by meal completion < 25%.  GOAL:   Patient will meet greater than or equal to 90% of their needs  MONITOR:   PO intake, Supplement acceptance, Diet advancement, Weight trends, Labs, Skin, I & O's  REASON FOR ASSESSMENT:   LOS    ASSESSMENT:   Patient with PMH significant for DM, HTN, and CKD IV (baseline creatinine 2.5-2.6). Admitted last moth for CHF due to severe AS and had worsening renal function with diuresis. Was found not to be a good candidate for long term HD and was discharged home with palliative. Presents this admission with ARF on CKD IV and uremic encephalopathy.    RD working remotely.  Palliative spoke with family 3/18. Not ready for full comfort care, plan to treat the treatable with DNR/DNI.   Pt confused and unable to speak with RD. Spoke with RN via phone. Pt's not taking much by mouth. Meal completions charted as 0-25% since admit. His diet was downgraded to DYS 1 today by SLP due to being severe aspiration risk. Nursing attempted to give pt medication with apple sauce and pt needed multiple reminders to swallow. Will provide Magic Cup TID as nursing states liquids remain difficult. Pt is at risk for malnutrition, would consider addressing nutrition with goals of care.   Unable to obtain wt history PTA. Weight noted to increase from 106.3 kg on 4/16 to 144.7 kg today. Diuretics discontinued due to worsening renal function. Will continue to monitor trends.   No BM charted in 5 days.   I/O: -1,482 ml since admit UOP: 750 ml x 24 hrs    Medications: dulcolax, ferrous sulfate, MVI with minerals Labs: CBG 182-255 Phosphorus 5.6 (H) Creatinine 3.42- trending up  Diet Order:   Diet Order            DIET - DYS 1 Room service appropriate? Yes; Fluid consistency: Thin  Diet effective now              EDUCATION NEEDS:   Not appropriate for education at this time  Skin:  Skin Assessment: Skin Integrity Issues: Skin Integrity Issues:: Other (Comment), Unstageable Unstageable: coccyx Other: MASD- groin, scrotum, penis, R leg, L rib  Last BM:  4/16  Height:   Ht Readings from Last 1 Encounters:  10/10/18 6' (1.829 m)    Weight:   Wt Readings from Last 1 Encounters:  10/12/18 (!) 144.7 kg    Ideal Body Weight:  80.9 kg  BMI:  Body mass index is 43.26 kg/m.  Estimated Nutritional Needs:   Kcal:  2200-2400 kcal  Protein:  110-130 grams  Fluid:  Per MD   Mariana Single RD, LDN Clinical Nutrition Pager # [phone]

## 2018-10-15 NOTE — Progress Notes (Signed)
Patient agitated at this time, breathing treatments held RN aware. No respiratory distress noted, RCP will continue to follow.

## 2018-10-15 NOTE — Progress Notes (Signed)
Inpatient Diabetes Program Recommendations  AACE/ADA: New Consensus Statement on Inpatient Glycemic Control (2015)  Target Ranges:  Prepandial:   less than 140 mg/dL      Peak postprandial:   less than 180 mg/dL (1-2 hours)      Critically ill patients:  140 - 180 mg/dL   Results for Billy [name], Billy [name] (MRN [id number]) as of 10/15/2018 09:34  Ref. Range 10/15/2018 06:25  Glucose Latest Ref Range: 70 - 99 mg/dL 255 (H)    Review of Glycemic Control  Diabetes history: DM 2, Diet controlled  Current orders for Inpatient glycemic control: none  Inpatient Diabetes Program Recommendations:    Last A1c 6.1% on 3/21  Lab glucose 255 mg/dl this morning. Patient on IV Solumedrol 40 mg Q12 hours. Consider CBGs ACHS and Novolog 0-9 units tid while on steroids.  Thanks,  Tama Headings RN, MSN, BC-ADM Inpatient Diabetes Coordinator Team Pager [phone] (8a-5p)

## 2018-10-15 NOTE — Progress Notes (Signed)
  Speech Language Pathology Treatment: Dysphagia  Patient Details Name: Billy [name] MRN: [id number] DOB: 08/22/1934 Today's Date: 10/15/2018 Time: 6237-6283 SLP Time Calculation (min) (ACUTE ONLY): 13 min  Assessment / Plan / Recommendation Clinical Impression  Pt continues to appear distressed, restless, pulling at mitts although less verbal this session. Agrees to water but cognitive impairments prevented ability to use straw and teaspoon sips consumed. One bite pudding and 3-4 swallows with water and puree indicating possible pharyngeal residue. Delayed cough and intermittent suspected penetration/aspiration given poor cognition and increased respiratory demand causing premature inhalation during swallows. RN tech/sitter with pt and educated to attempt straw but use spoon if unable; water would be less toxic to lungs versus other liquids. Sit him upright using reverse Trendelenburg position. Unfortunately aspiration risk is high at this point in pt's medical course.    HPI HPI: Billy [name] is a 84 y.o. male with medical history significant of CKD stage 4, HTN, DM, HTN, CHF. Per chart, pt not a candidate for dialysis and Palliative care spoke with patient, he apparently made it clear he wasn't interested in long term dialysis or heroic measures as their 3/21 note discusses and discharged home with Palliative / Hospice care. CT abdomen noted right lower lobe airspace disease, moderate pleural effucion. Found to have AKF on CKD stage 4. NO prior ST documentation located.       SLP Plan  Continue with current plan of care       Recommendations  Diet recommendations: Dysphagia 1 (puree);Thin liquid Liquids provided via: Teaspoon;Straw(straw if able) Medication Administration: Crushed with puree Supervision: Full supervision/cueing for compensatory strategies;Staff to assist with self feeding Compensations: Minimize environmental distractions;Slow rate;Small sips/bites Postural Changes  and/or Swallow Maneuvers: Seated upright 90 degrees                Oral Care Recommendations: Oral care BID Follow up Recommendations: 24 hour supervision/assistance;Skilled Nursing facility SLP Visit Diagnosis: Dysphagia, oral phase (R13.11) Plan: Continue with current plan of care                      Houston Siren 10/15/2018, 9:17 AM  [name] [name].Ed Risk analyst [phone] Office [phone]

## 2018-10-15 NOTE — Progress Notes (Addendum)
Patient was very agitated, getting out of the bed, doesn't follow commands, confused and pulling out his Foley and IV even with mittens on, yelling, Ativan given but not worked, paged Dr. Grandville Silos for restraint order and was given. Patient placed on bilateral soft wrist restraints, tried to notify family [name] white) daughter, didn't pick the call and couldn't leave a message as voicemail was not setup.  Will continue to monitor.

## 2018-10-15 NOTE — Progress Notes (Signed)
Patient continue to be agitated. Yelling non stop. "Help me."  He continues to pull his foley cath off. Restraints in placed and checked for proper placement. Safety sitter at bedside. Will continue to monitor.

## 2018-10-15 NOTE — Progress Notes (Signed)
PROGRESS NOTE    Billy [name]  GUR:[id number] DOB: [birth year] DOA: 10/09/2018 PCP: Patient, No Pcp Per   Brief Narrative:  HPI per Billy [name] is a 84 y.o. male with medical history significant of CKD stage 4, HTN, DM that appears diet controlled.  Late last month he was admitted for CHF due to severe AS, diuresed, noted to have somewhat worsening renal function with diuresis.  Cardiology didn't feel he would be a good candidate for intervention regarding his AS.  Nephrology likewise didn't think he would be a candidate at all for long term dialysis.  Pal care spoke with patient, he apparently made it clear he wasn't interested in long term dialysis or heroic measures as their 3/21 note discusses.  He was discharged home with Palliative / Hospice care.  Seen by cardiology for video office visit (due to current [place] pandemic), on 4/10.  Cardiologist noted that he was quite "sluggish", and noted "I am worried about potentially worsening of his kidney function with quite aggressive diuresis."  Chem 7 was ordered, obtained today.  And showed BUN in the 140s with creat of 3.5.  Patient sent in to ED.   ED Course: BUN 140, creat 3.5.  Bicarb 18.  K 5.7 (temp measures given, no EKG changes).  Patient with AMS.  Patient transferred to [facility].  Assessment & Plan:   Principal Problem:   Acute respiratory failure with hypoxia (HCC) Active Problems:   Acute renal failure superimposed on stage 4 chronic kidney disease (HCC)   HCAP (healthcare-associated pneumonia)   Pleural effusion   Type 2 diabetes mellitus (HCC)   Hyperlipidemia   Essential hypertension, benign   Severe aortic stenosis   Hypertension   Uremic encephalopathy   Cellulitis of scrotum   Anemia   Parapneumonic effusion   Pressure injury of skin   Goals of care, counseling/discussion   Palliative care by specialist   Right-sided chest wall pain  1 acute respiratory failure with hypoxia Patient noted to have  increased O2 requirement since the evening of 10/10/2018.  Questionable etiology.  Some improvement with hypoxia.  Likely multifactorial secondary to right lower lobe pneumonia and probable parapneumonic effusion in the setting of severe AS and concerns for possible volume overload.  Influenza PCR negative.  Patient with no cough.  ABG with ph 7.4, pco2 35 po2 58. Patient s/p ultrasound-guided thoracentesis with 1L bloody fluid removed and c/w exudate.  IV fluids have been saline locked.  Continue to hold diuretics due to worsening renal function.  Blood cultures with no growth to date.  Patient with improvement with wheezing. D/C solumedrol. Continue scheduled Xopenex and Atrovent nebs, Pulmicort nebs.  Discontinued IV vancomycin.  Continue IV cefepime. Patient seen in consultation by pulmonary who are recommending treat as a parapneumonic effusion with 10 to 14 days of antibiotics pending Gram stain and culture.  Supportive care.   2 Acute renal failure on chronic kidney disease stage IV Likely secondary to prerenal azotemia from aggressive diuresis.  It was noted from prior hospitalization that cardiology was concerned about this possibility as well as lethargy.  Patient did have a video visit 5 days prior to admission.  Patient noted not to be a candidate and does not want dialysis per palliative care notes and nephrology notes from last admission.  Hyperkalemia improved.  Renal function was initially improving however trending back up and currently at 3.42 from 3.15 from 2.88 from 3.78 on admission.  BUN at 126 from 138 on admission.  Patient still with asterixis however mentation fluctuating.  Patient noted to be more hypoxic and due to history of severe AS.  IV fluids initially saline lock.  Continue to hold diuretics due to worsening renal function.  Placed on gentle hydration normal saline at 75 cc/h for the next 2 days. IVF NSL. CT abdomen and pelvis negative for hydronephrosis.  Foley catheter in place.   Due to worsening renal function,  nephrology consulted and following. During last hospitalization it was deemed patient was not a candidate for hemodialysis and patient did not want to have hemodialysis.  Follow.   3.  Acute metabolic encephalopathy likely secondary to uremic encephalopathy Patient noted to have asterixis on examination.  Patient was confused on admission.  Patient on admission noted to have BUN of 138 with a creatinine of 3.78.  Renal function was initially trending down however trending back up.  Patient noted to be agitated with some confusion.  Likely secondary to worsening renal function.  IV Dilaudid has been discontinued.  Continue IV Ativan as needed.  Change Zyprexa to 2.5 mg p.o. twice daily.  Diuretics on hold.  IV fluids have been stopped per nephrology.  Follow.  4.  HCAP/probable parapneumonic effusion CT abdomen and pelvis noted a right lower lobe pneumonia as well as a pleural effusion.  Urine strep pneumococcus antigen negative.  Urine Legionella antigen pending.  Blood cultures pending with no growth to date. Patient with a leukocytosis with a white count of 20.2 is trending down and currently at 11.7. IR for ultrasound-guided thoracentesis which was done 10/11/2018 with 1 L of bloody fluid removed.  Findings consistent with exudate.  CT chest which was done 10/12/2018 with multiple right-sided rib fractures, no pneumothorax.  Bilateral pleural effusions right greater than left with associated lower lobe consolidation right greater than left.  Dependent gallstones noted.  Patient seen by pulmonary who are recommending treating as a parapneumonic effusion for 10 to 14 days.  IV Rocephin and IV vancomycin discontinued and patient on IV cefepime. Blood cultures with no growth to date. Continue IV Cefepime.  Appreciate pulmonary's input and recommendations.  Follow.   5.  Iron deficiency anemia/anemia of chronic disease Patient with no overt bleeding.  Hemoglobin seems to have  stabilized around 8.0 from 7.8.  Likely dilutional in nature.  Patient with no overt bleeding.  Patient however noted to have bloody exudate on thoracentesis.  Transfusion threshold hemoglobin less than 7.  Continue oral iron supplementation.  6.  Severe AS Patient deemed not a candidate for intervention per cardiologist last note from prior admission.  Patient with increased O2 requirements with hypoxia requiring 6 L nasal cannula on 10/11/2018.  O2 requirements improved with saline lock of IV fluids.  Half home dose diuretics were held due to bump in creatinine.  Continue to hold diuretics as creatinine further increased today.  Gentle hydration was discontinued per nephrology.  Monitor fluid status closely.  Follow.   7.  Bacteriuria/Pyuria Urine cultures with 20,000 coplonis of proteus penneri. Blood cultures pending with no growth to date..  IV Rocephin was changed to IV cefepime due to concerns for healthcare associated pneumonia parapneumonic effusion.  Follow.    8.  Cellulitis of the scrotum/Candida in the scrotum/MASD and wounds to perineum, buttocks, POA Patient noted to have a candidal rash issues during last admission.  Patient with a leukocytosis with a white count of 20.2 which is trending down.Marland Kitchen  MRSA PCR positive.  CT abdomen and pelvis with no indication  of Fournier's.  Clinically improving.  IV Rocephin has been changed to IV vancomycin IV cefepime as patient also noted to have a pneumonia.  Wound care consulted.  Continue nystatin topical powder.  Doubt if patient will be able to make it through surgery and subsequent ICU stay if indeed he has a Fournier's gangrene.  Continue current wound care as recommended per wound care RN.  Discontinued IV vancomycin.  9.  Hypertension Continue current regimen of antihypertensive medications.  Diuretics on hold due to worsening renal function.  Follow.   10.  Chronic diastolic heart failure Patient with increased hypoxia early on in the  hospitalization requiring 6 L nasal cannula on 10/11/2018.  Increase O2 requirements improving. Patient was on IV fluids for acute on chronic kidney disease.  Patient also noted to have a pneumonia. IV fluids have been saline locked.  Patient was resumed at half home dose diuretics however slight bump in creatinine and as such diet recs have been discontinued. IVF d/c'd per nephrology.  Follow.   11. Multiple right sided Rib fractures Likely secondary to fall. Supportive care.  Incentive spirometry.  12.  Agitation Likely secondary to acute on chronic kidney disease. D/C Dilaudid. Continue  IV Ativan as needed for agitation. Increase zyprexa to 2.5mg  BID.  13. Prognosis Patient with a poor prognosis.  Patient with acute on chronic kidney disease and not a dialysis candidate.  Patient with severe aortic stenosis and not a candidate for intervention.  Patient with no significant improvement with renal function.  Nephrology following.  Palliative care following.  Patient may need to be transition to comfort measures as no significant improvement.    DVT prophylaxis: SCDs Code Status: DNR Family Communication: Updated patient.  Disposition Plan: To be determined.   Consultants:   Palliative care: Vinie Sill, NP 0/17/5102  Pulmonary: Dr. Elsworth Soho 10/11/2018  Nephrology: Dr. Royce Macadamia 10/14/2018  Procedures:  CT abdomen and pelvis 10/10/2018  US Guided Thoracentesis 10/11/2018--- 1 L of bloody fluid  Antimicrobials:   IV cefepime 10/10/2018  IV vancomycin 10/10/2018>>>> 10/13/2018   Subjective: Patient drowsy.  Patient somewhat confused.  Patient noted to have some agitation.   Objective: Vitals:   10/14/18 1740 10/14/18 2130 10/15/18 0921 10/15/18 1301  BP: 114/70 121/65  (!) 130/55  Pulse: 86 96  90  Resp: 18 18 20 19   Temp: 97.6 F (36.4 C) 98 F (36.7 C)  98.9 F (37.2 C)  TempSrc: Oral   Oral  SpO2: 94% 93%  92%  Weight:      Height:        Intake/Output Summary (Last 24  hours) at 10/15/2018 1630 Last data filed at 10/15/2018 1300 Gross per 24 hour  Intake 3 ml  Output 350 ml  Net -347 ml   Filed Weights   10/09/18 2300 10/10/18 2122 10/12/18 0500  Weight: 107 kg 106.3 kg (!) 144.7 kg    Examination:  General exam: NAD Respiratory system: Clear to auscultation bilaterally anterior lung fields.  Some decreased breath sounds in the bases.  No wheezing.  No crackles.   Cardiovascular system: RRR with 3/6 systolic ejection murmur right upper sternal border, left upper sternal border, left lower sternal border.  No JVD.  No edema.  Gastrointestinal system: Abdomen is nontender, nondistended, soft, positive bowel sounds.  No rebound.  No guarding.  Central nervous system: Drowsy. Moving extremities spontaneously.  Asterixis.  Extremities: Symmetric 5 x 5 power. Skin:           No  rashes, lesions or ulcers  Psychiatry: Judgement and insight appear fair. Mood & affect appropriate.     Data Reviewed: I have personally reviewed following labs and imaging studies  CBC: Recent Labs  Lab 10/11/18 0651 10/11/18 1441 10/12/18 0450 10/13/18 0612 10/14/18 0623 10/15/18 0625  WBC 12.8*  --  13.3* 11.1* 10.8* 11.7*  NEUTROABS 11.3*  --  11.9* 9.5*  --  11.1*  HGB 7.4* 7.9* 7.3* 7.8* 7.8* 8.0*  HCT 24.0* 25.6* 23.9* 26.7* 26.7* 27.8*  MCV 86.6  --  87.2 88.7 89.3 91.1  PLT 155  --  154 134* 185 329   Basic Metabolic Panel: Recent Labs  Lab 10/11/18 0651 10/12/18 0450 10/13/18 0612 10/14/18 0623 10/15/18 0625  NA 139 139 139 143 144  K 3.9 3.7 4.1 4.1 4.8  CL 104 100 103 105 107  CO2 22 24 22 23  19*  GLUCOSE 97 182* 171* 168* 255*  BUN 121* 120* 124* 126* 131*  CREATININE 3.03* 2.88* 3.15* 3.42* 3.42*  CALCIUM 8.2* 8.3* 8.1* 8.6* 8.8*  MG  --  2.7*  --   --   --   PHOS  --  4.3 4.7* 5.1* 5.6*   GFR: Estimated Creatinine Clearance: 25 mL/min (A) (by C-G formula based on SCr of 3.42 mg/dL (H)). Liver Function Tests: Recent Labs    Lab 10/10/18 1109 10/11/18 0651 10/12/18 0450 10/13/18 0612 10/14/18 0623 10/15/18 0625  AST 41 35  --   --   --   --   ALT 24 25  --   --   --   --   ALKPHOS 77 84  --   --   --   --   BILITOT 0.8 1.0  --   --   --   --   PROT 5.5* 5.1*  --   --   --   --   ALBUMIN 2.3* 2.0* 1.9* 1.9* 1.9* 2.0*   No results for input(s): LIPASE, AMYLASE in the last 168 hours. Recent Labs  Lab 10/15/18 1142  AMMONIA 24   Coagulation Profile: No results for input(s): INR, PROTIME in the last 168 hours. Cardiac Enzymes: No results for input(s): CKTOTAL, CKMB, CKMBINDEX, TROPONINI in the last 168 hours. BNP (last 3 results) No results for input(s): PROBNP in the last 8760 hours. HbA1C: No results for input(s): HGBA1C in the last 72 hours. CBG: No results for input(s): GLUCAP in the last 168 hours. Lipid Profile: No results for input(s): CHOL, HDL, LDLCALC, TRIG, CHOLHDL, LDLDIRECT in the last 72 hours. Thyroid Function Tests: No results for input(s): TSH, T4TOTAL, FREET4, T3FREE, THYROIDAB in the last 72 hours. Anemia Panel: No results for input(s): VITAMINB12, FOLATE, FERRITIN, TIBC, IRON, RETICCTPCT in the last 72 hours. Sepsis Labs: No results for input(s): PROCALCITON, LATICACIDVEN in the last 168 hours.  Recent Results (from the past 240 hour(s))  Culture, Urine     Status: Abnormal   Collection Time: 10/09/18 11:56 PM  Result Value Ref Range Status   Specimen Description URINE, RANDOM  Final   Special Requests   Final    NONE Performed at [facility], [address], [place], [place] [address]    Culture 20,000 COLONIES/mL PROTEUS PENNERI (A)  Final   Report Status 10/12/2018 FINAL  Final   Organism ID, Bacteria PROTEUS PENNERI (A)  Final      Susceptibility   Proteus penneri - MIC*    AMPICILLIN >=32 RESISTANT Resistant     CEFAZOLIN >=  64 RESISTANT Resistant     CEFTRIAXONE >=64 RESISTANT Resistant     CIPROFLOXACIN <=0.25 SENSITIVE Sensitive     GENTAMICIN <=1  SENSITIVE Sensitive     IMIPENEM 4 SENSITIVE Sensitive     NITROFURANTOIN 128 RESISTANT Resistant     TRIMETH/SULFA <=20 SENSITIVE Sensitive     AMPICILLIN/SULBACTAM >=32 RESISTANT Resistant     PIP/TAZO <=4 SENSITIVE Sensitive     * 20,000 COLONIES/mL PROTEUS PENNERI  Culture, blood (routine x 2)     Status: None   Collection Time: 10/10/18 12:15 AM  Result Value Ref Range Status   Specimen Description BLOOD RIGHT HAND  Final   Special Requests   Final    BOTTLES DRAWN AEROBIC AND ANAEROBIC Blood Culture adequate volume   Culture   Final    NO GROWTH 5 DAYS Performed at [facility], [place] [address], [place], [place] [address]    Report Status 10/15/2018 FINAL  Final  Culture, blood (routine x 2)     Status: None   Collection Time: 10/10/18 12:15 AM  Result Value Ref Range Status   Specimen Description BLOOD LEFT HAND  Final   Special Requests   Final    BOTTLES DRAWN AEROBIC ONLY Blood Culture results may not be optimal due to an excessive volume of blood received in culture bottles   Culture   Final    NO GROWTH 5 DAYS Performed at [facility], [place] [address] [place], [place] [address]    Report Status 10/15/2018 FINAL  Final  MRSA PCR Screening     Status: Abnormal   Collection Time: 10/10/18  2:10 AM  Result Value Ref Range Status   MRSA by PCR POSITIVE (A) NEGATIVE Final    Comment:        The GeneXpert MRSA Assay (FDA approved for NASAL specimens only), is one component of a comprehensive MRSA colonization surveillance program. It is not intended to diagnose MRSA infection nor to guide or monitor treatment for MRSA infections. GENGLER,K RN 10/10/2018 AT 0354 SKEEN,P Performed at [facility], [place] [address], [place], [place] [address]   Gram stain     Status: None   Collection Time: 10/11/18  1:50 PM  Result Value Ref Range Status   Specimen Description FLUID PLEURAL RIGHT  Final   Special Requests NONE  Final   Gram Stain   Final    FEW  WBC PRESENT,BOTH PMN AND MONONUCLEAR NO ORGANISMS SEEN Performed at [facility], [place] [address], [place], [place] [address]    Report Status 10/11/2018 FINAL  Final  Culture, body fluid-bottle     Status: None (Preliminary result)   Collection Time: 10/11/18  1:50 PM  Result Value Ref Range Status   Specimen Description FLUID PLEURAL RIGHT  Final   Special Requests BOTTLES DRAWN AEROBIC AND ANAEROBIC  Final   Culture   Final    NO GROWTH 4 DAYS Performed at [facility], [place] [address] [place], [place] [address]    Report Status PENDING  Incomplete         Radiology Studies: No results found.      Scheduled Meds:  aspirin  81 mg Oral Daily   atorvastatin  20 mg Oral Daily   bisacodyl  10 mg Rectal Once   bisoprolol  5 mg Oral Daily   budesonide (PULMICORT) nebulizer solution  0.5 mg Nebulization BID   buPROPion  75 mg Oral BID  chlorhexidine  15 mL Mouth Rinse BID   doxazosin  4 mg Oral Daily   escitalopram  10 mg Oral Daily   ferrous sulfate  325 mg Oral BID WC   fluticasone  2 spray Each Nare Daily   guaiFENesin  400 mg Oral Q4H   ipratropium  0.5 mg Nebulization Q6H   levalbuterol  0.63 mg Nebulization Q6H   loratadine  10 mg Oral Daily   mouth rinse  15 mL Mouth Rinse q12n4p   multivitamin with minerals  1 tablet Oral Daily   nystatin   Topical TID   OLANZapine  2.5 mg Oral BID   pantoprazole sodium  40 mg Per Tube Daily   sodium chloride flush  3 mL Intravenous Q12H   Continuous Infusions:  sodium chloride 75 mL/hr at 10/15/18 1544   ceFEPime (MAXIPIME) IV 2 g (10/15/18 1126)     LOS: 6 days    Time spent: 40 minutes    Irine Seal, MD [facility]  If 7PM-7AM, please contact night-coverage [email or web address] 10/15/2018, 4:30 PM

## 2018-10-15 NOTE — Progress Notes (Signed)
Attempted to contact daughter, [name], at mobile number - vm not set up, and home number without vm. Attempted to contact daughter, Ignacia Bayley, on mobile, but no vm set up.   CM has worked with patient and family during previous admission. [employer] of [facility] - patient is active for RN, PT, OT, and SW. Patient also was set up with oxygen in the home through [facility] - CM verified that this set up was complete.   CM to continue to follow for transition of care needs.  Manya Silvas, RN MSN CCM Transitions of Care 60M CM [phone]

## 2018-10-15 NOTE — Progress Notes (Signed)
Pt yelling trying to get out of bed,ativan given pt calmed down for three hours and started yelling again when he woke up,he took his oxygen off,would not leave tele box on,and pulling at his foley,pt does not follow simple commands,pt agitated and aggressive to staff when trying to climb over rails,will continue to monitor and  keep pt safe.

## 2018-10-16 LAB — CBC WITH DIFFERENTIAL/PLATELET
Abs Immature Granulocytes: 0.09 10*3/uL — ABNORMAL HIGH (ref 0.00–0.07)
Basophils Absolute: 0 10*3/uL (ref 0.0–0.1)
Basophils Relative: 0 %
Eosinophils Absolute: 0 10*3/uL (ref 0.0–0.5)
Eosinophils Relative: 0 %
HCT: 27.8 % — ABNORMAL LOW (ref 39.0–52.0)
Hemoglobin: 8 g/dL — ABNORMAL LOW (ref 13.0–17.0)
Immature Granulocytes: 1 %
Lymphocytes Relative: 4 %
Lymphs Abs: 0.5 10*3/uL — ABNORMAL LOW (ref 0.7–4.0)
MCH: 26.5 pg (ref 26.0–34.0)
MCHC: 28.8 g/dL — ABNORMAL LOW (ref 30.0–36.0)
MCV: 92.1 fL (ref 80.0–100.0)
Monocytes Absolute: 1 10*3/uL (ref 0.1–1.0)
Monocytes Relative: 7 %
Neutro Abs: 12 10*3/uL — ABNORMAL HIGH (ref 1.7–7.7)
Neutrophils Relative %: 88 %
Platelets: 214 10*3/uL (ref 150–400)
RBC: 3.02 MIL/uL — ABNORMAL LOW (ref 4.22–5.81)
RDW: 19.6 % — ABNORMAL HIGH (ref 11.5–15.5)
WBC: 13.6 10*3/uL — ABNORMAL HIGH (ref 4.0–10.5)
nRBC: 0.1 % (ref 0.0–0.2)

## 2018-10-16 LAB — CULTURE, BODY FLUID W GRAM STAIN -BOTTLE: Culture: NO GROWTH

## 2018-10-16 LAB — RENAL FUNCTION PANEL
Albumin: 2.2 g/dL — ABNORMAL LOW (ref 3.5–5.0)
Anion gap: 17 — ABNORMAL HIGH (ref 5–15)
BUN: 133 mg/dL — ABNORMAL HIGH (ref 8–23)
CO2: 21 mmol/L — ABNORMAL LOW (ref 22–32)
Calcium: 9 mg/dL (ref 8.9–10.3)
Chloride: 112 mmol/L — ABNORMAL HIGH (ref 98–111)
Creatinine, Ser: 3.49 mg/dL — ABNORMAL HIGH (ref 0.61–1.24)
GFR calc Af Amer: 18 mL/min — ABNORMAL LOW (ref >60)
GFR calc non Af Amer: 16 mL/min — ABNORMAL LOW (ref >60)
Glucose, Bld: 175 mg/dL — ABNORMAL HIGH (ref 70–99)
Phosphorus: 5.1 mg/dL — ABNORMAL HIGH (ref 2.5–4.6)
Potassium: 4.3 mmol/L (ref 3.5–5.1)
Sodium: 150 mmol/L — ABNORMAL HIGH (ref 135–145)

## 2018-10-16 MED ORDER — COLLAGENASE 250 UNIT/GM EX OINT
TOPICAL_OINTMENT | Freq: Every day | CUTANEOUS | Status: DC
  Administered 2018-10-16 – 2018-10-17 (×2): via TOPICAL
  Filled 2018-10-16: qty 30

## 2018-10-16 MED ORDER — GLYCOPYRROLATE 0.2 MG/ML IJ SOLN
0.2000 mg | INTRAMUSCULAR | Status: DC | PRN
  Administered 2018-10-16: 0.2 mg via INTRAVENOUS
  Filled 2018-10-16: qty 1

## 2018-10-16 MED ORDER — LORAZEPAM 2 MG/ML IJ SOLN
0.5000 mg | INTRAMUSCULAR | Status: DC | PRN
  Administered 2018-10-16: 1 mg via INTRAVENOUS
  Filled 2018-10-16: qty 1

## 2018-10-16 MED ORDER — LORAZEPAM 2 MG/ML IJ SOLN
1.0000 mg | INTRAMUSCULAR | Status: DC | PRN
  Administered 2018-10-17: 1 mg via INTRAVENOUS
  Administered 2018-10-17: 2 mg via INTRAVENOUS
  Filled 2018-10-16 (×2): qty 1

## 2018-10-16 MED ORDER — LEVALBUTEROL HCL 0.63 MG/3ML IN NEBU
0.6300 mg | INHALATION_SOLUTION | Freq: Two times a day (BID) | RESPIRATORY_TRACT | Status: DC
  Administered 2018-10-16: 0.63 mg via RESPIRATORY_TRACT
  Filled 2018-10-16: qty 3

## 2018-10-16 MED ORDER — BIOTENE DRY MOUTH MT LIQD: 15.0000 mL | OROMUCOSAL | Status: DC | PRN

## 2018-10-16 MED ORDER — GLYCOPYRROLATE 0.2 MG/ML IJ SOLN: 0.2000 mg | INTRAMUSCULAR | Status: DC | PRN

## 2018-10-16 MED ORDER — HYDROMORPHONE HCL 1 MG/ML IJ SOLN
1.0000 mg | INTRAMUSCULAR | Status: DC | PRN
  Administered 2018-10-16 – 2018-10-17 (×2): 1 mg via INTRAVENOUS
  Filled 2018-10-16 (×2): qty 1

## 2018-10-16 MED ORDER — IPRATROPIUM BROMIDE 0.02 % IN SOLN
0.5000 mg | Freq: Two times a day (BID) | RESPIRATORY_TRACT | Status: DC
  Administered 2018-10-16: 0.5 mg via RESPIRATORY_TRACT
  Filled 2018-10-16: qty 2.5

## 2018-10-16 MED ORDER — HYDROMORPHONE HCL 1 MG/ML IJ SOLN
1.0000 mg | INTRAMUSCULAR | Status: AC
  Administered 2018-10-16: 1 mg via INTRAVENOUS
  Filled 2018-10-16: qty 1

## 2018-10-16 MED ORDER — HYDROMORPHONE HCL 1 MG/ML IJ SOLN
0.5000 mg | INTRAMUSCULAR | Status: DC | PRN
  Administered 2018-10-16: 0.5 mg via INTRAVENOUS
  Filled 2018-10-16: qty 1

## 2018-10-16 MED ORDER — LORAZEPAM 2 MG/ML IJ SOLN: 0.5000 mg | Freq: Four times a day (QID) | INTRAMUSCULAR | Status: DC | PRN

## 2018-10-16 MED ORDER — OLANZAPINE 5 MG PO TABS: 5.0000 mg | ORAL_TABLET | Freq: Two times a day (BID) | ORAL | Status: DC

## 2018-10-16 MED ORDER — POLYVINYL ALCOHOL 1.4 % OP SOLN
1.0000 [drp] | Freq: Four times a day (QID) | OPHTHALMIC | Status: DC | PRN
  Filled 2018-10-16: qty 15

## 2018-10-16 NOTE — Progress Notes (Signed)
Educated family member, [name], that the patient is allowed up to four visitors. She verbalized understanding and listed herself as well as Ignacia Bayley, "Junior" Clemencia Course, and Sandria Manly. Front desk is aware.

## 2018-10-16 NOTE — Progress Notes (Signed)
[facility] Progress Note  Patient: Billy [name] TDH:[id number]   PCP: Patient, No Pcp Per DOB: [birth year]   DOA: 10/09/2018   DOS: 10/16/2018   Date of Service: the patient was seen and examined on 10/16/2018  Brief hospital course: Pt. with PMH of severe left ear medically managed, CKD stage IV, HTN, type II DM, chronic HFPEF; admitted on 10/09/2018, presented with complaint of confusion and shortness of breath, was found to have worsening renal function as well as CHF. Currently further plan is transition to comfort measures.  Subjective: Patient is restless and agitated.  Yelling help me.  Fidgeting.  Trying to pull out IVs and catheter.  Assessment and Plan: 1. Acute hypoxic respiratory failure. Healthcare associated pneumonia. Parapneumonic effusion. Presented with cough and shortness of breath was hypoxic on admission.  Chest x-ray showed right lower lobe pneumonia and parapneumonic effusion.  Influenza PCR was negative. S/P ultrasound-guided thoracentesis with 1 L of bloody fluid removed. Analysis consistent with exudate. Cultures so far negative. Blood cultures negative as well. CT chest abdomen pelvis was performed which showed right lower lobe pneumonia, multiple right rib fractures, no pneumothorax, gallstones. Started on IV diuresis as well as IV antibiotics. Started on DuoNeb's. PCCM was consulted recommended continuing antibiotics for 10 to 14 days. Currently the patient remains hypoxic and agitated. Diuresis is on hold.  Due to worsening renal function Currently on IV cefepime  2. Acute on chronic diastolic CHF. Severe aortic stenosis. Patient not a candidate for intervention by cardiologist. Continues to remain hypoxic. Given aggressive IV diuresis which was on hold due to worsening renal function.Poor prognosis.  3.  Acute kidney injury on chronic kidney disease stage IV. Combination of prerenal etiology likely from aggressive diuresis. As well as cardiorenal  hemodynamics. Recently admitted and was seen by nephrology as well as palliative care. Renal function progressively worsened. Nephrology fellow the patient is not a candidate for dialysis last admission. Currently given his worsening mentation as well as possible aspiration patient still deemed not a candidate for hemodialysis. Will not tolerated as well secondary to severe aortic stenosis. CT abdomen negative for hydronephrosis. Foley catheter inserted. Nephrology recommends discussion with family regarding comfort care since the patient is not a dialysis candidate.  4.  Acute metabolic and toxic encephalopathy. Likely combination of uremia as well as polypharmacy with morphine, Wellbutrin, Zyprexa. Continues to remain agitated and confused. Significant change from his baseline. Currently a Zyprexa dose has been adjusted.  5.  UTI. Cellulitis of the perineal area and scrotum. CT abdomen performed on admission was negative for Fournier's gangrene. Patient was given IV antibiotics with which there is improvement in his presentation. Continue topical nystatin.  6.  Goals of care discussion. Attempt to reach the family were not successful by myself. Palliative care was consulted prior. Reconsulted them again.  Will discuss with family. Current plan is to transition to comfort care. AnticipatingRapid decline after initiation of the comfort measures although trajectory not confirmed. Continue IV 81 1 to 2 mg every 2 hours as needed, Dilaudid 1 mg every 30-minute as needed. Family wishes to take the patient home if he is more comfortable and stable to be discharged home with hospice.  Pressure ulcer. On coccyx. Present on admission. Continue to monitor. Pressure Injury 10/09/18 Unstageable - Full thickness tissue loss in which the base of the ulcer is covered by slough (yellow, tan, gray, green or brown) and/or eschar (tan, brown or black) in the wound bed. Unstageable wound above rectum  (Active)  10/09/18 2330  Location: Coccyx  Location Orientation: Posterior;Medial  Staging: Unstageable - Full thickness tissue loss in which the base of the ulcer is covered by slough (yellow, tan, gray, green or brown) and/or eschar (tan, brown or black) in the wound bed.  Wound Description (Comments): Unstageable wound above rectum  Present on Admission: Yes     Diet: comfort feeds  DVT Prophylaxis: subcutaneous Heparin  Advance goals of care discussion: DNR DNI comfort care  Family Communication: no family was present at bedside, at the time of interview.   Disposition:  Discharge to be determined.  Consultants: Nephrology, palliative care, PCCM Procedures: none  Scheduled Meds:  bisacodyl  10 mg Rectal Once   budesonide (PULMICORT) nebulizer solution  0.5 mg Nebulization BID   chlorhexidine  15 mL Mouth Rinse BID   collagenase   Topical Daily   doxazosin  4 mg Oral Daily   fluticasone  2 spray Each Nare Daily   ipratropium  0.5 mg Nebulization BID   levalbuterol  0.63 mg Nebulization BID   mouth rinse  15 mL Mouth Rinse q12n4p   nystatin   Topical TID   OLANZapine  5 mg Oral BID   pantoprazole sodium  40 mg Per Tube Daily   sodium chloride flush  3 mL Intravenous Q12H   Continuous Infusions:  ceFEPime (MAXIPIME) IV 2 g (10/16/18 1009)   PRN Meds: acetaminophen **OR** acetaminophen, antiseptic oral rinse, glycopyrrolate **OR** glycopyrrolate, HYDROmorphone (DILAUDID) injection, LORazepam, ondansetron **OR** ondansetron (ZOFRAN) IV, polyvinyl alcohol Antibiotics: Anti-infectives (From admission, onward)   Start     Dose/Rate Route Frequency Ordered Stop   10/10/18 0900  ceFEPIme (MAXIPIME) 2 g in sodium chloride 0.9 % 100 mL IVPB     2 g 200 mL/hr over 30 Minutes Intravenous Every 24 hours 10/10/18 0808 10/06/2018 0859   10/10/18 0815  vancomycin (VANCOCIN) 2,000 mg in sodium chloride 0.9 % 500 mL IVPB     2,000 mg 250 mL/hr over 120 Minutes Intravenous   Once 10/10/18 0811 10/10/18 1155   10/10/18 0000  cefTRIAXone (ROCEPHIN) 1 g in sodium chloride 0.9 % 100 mL IVPB  Status:  Discontinued     1 g 200 mL/hr over 30 Minutes Intravenous Every 24 hours 10/09/18 2355 10/10/18 0808       Objective: Physical Exam: Vitals:   10/15/18 1740 10/16/18 0616 10/16/18 0839 10/16/18 0901  BP: 127/62 (!) 121/52  (!) 142/50  Pulse: 88 84  85  Resp: 18 19  20   Temp: 98.6 F (37 C) 97.7 F (36.5 C)  97.7 F (36.5 C)  TempSrc: Oral Oral  Oral  SpO2: 93% 100% 95% 100%  Weight:      Height:        Intake/Output Summary (Last 24 hours) at 10/16/2018 1621 Last data filed at 10/16/2018 1500 Gross per 24 hour  Intake 100 ml  Output 1550 ml  Net -1450 ml   Filed Weights   10/09/18 2300 10/10/18 2122 10/12/18 0500  Weight: 107 kg 106.3 kg (!) 144.7 kg   General: Alert, Awake and not Oriented to Time, Place and Person. Appear in severe distress, affect labile Eyes: PERRL, Conjunctiva normal ENT: Oral Mucosa clear dry. Neck: difficult to assess  JVD, no Abnormal Mass Or lumps Cardiovascular: S1 and S2 Present, aortic systolic  Murmur, Peripheral Pulses Present Respiratory: increased respiratory effort, Bilateral Air entry equal and Decreased, no use of accessory muscle, basal Crackles, no wheezes Abdomen: Bowel Sound present, Soft and no tenderness,  no hernia Skin: no redness, no Rash, no induration Extremities: bilateral  Pedal edema, no calf tenderness Neurologic: Grossly no focal neuro deficit.  Data Reviewed: CBC: Recent Labs  Lab 10/11/18 0651  10/12/18 0450 10/13/18 0612 10/14/18 0623 10/15/18 0625 10/16/18 0527  WBC 12.8*  --  13.3* 11.1* 10.8* 11.7* 13.6*  NEUTROABS 11.3*  --  11.9* 9.5*  --  11.1* 12.0*  HGB 7.4*   < > 7.3* 7.8* 7.8* 8.0* 8.0*  HCT 24.0*   < > 23.9* 26.7* 26.7* 27.8* 27.8*  MCV 86.6  --  87.2 88.7 89.3 91.1 92.1  PLT 155  --  154 134* 185 195 214   < > = values in this interval not displayed.   Basic Metabolic  Panel: Recent Labs  Lab 10/12/18 0450 10/13/18 0612 10/14/18 0623 10/15/18 0625 10/16/18 0527  NA 139 139 143 144 150*  K 3.7 4.1 4.1 4.8 4.3  CL 100 103 105 107 112*  CO2 24 22 23  19* 21*  GLUCOSE 182* 171* 168* 255* 175*  BUN 120* 124* 126* 131* 133*  CREATININE 2.88* 3.15* 3.42* 3.42* 3.49*  CALCIUM 8.3* 8.1* 8.6* 8.8* 9.0  MG 2.7*  --   --   --   --   PHOS 4.3 4.7* 5.1* 5.6* 5.1*    Liver Function Tests: Recent Labs  Lab 10/10/18 1109 10/11/18 0651 10/12/18 0450 10/13/18 0612 10/14/18 0623 10/15/18 0625 10/16/18 0527  AST 41 35  --   --   --   --   --   ALT 24 25  --   --   --   --   --   ALKPHOS 77 84  --   --   --   --   --   BILITOT 0.8 1.0  --   --   --   --   --   PROT 5.5* 5.1*  --   --   --   --   --   ALBUMIN 2.3* 2.0* 1.9* 1.9* 1.9* 2.0* 2.2*   No results for input(s): LIPASE, AMYLASE in the last 168 hours. Recent Labs  Lab 10/15/18 1142  AMMONIA 24   Coagulation Profile: No results for input(s): INR, PROTIME in the last 168 hours. Cardiac Enzymes: No results for input(s): CKTOTAL, CKMB, CKMBINDEX, TROPONINI in the last 168 hours. BNP (last 3 results) No results for input(s): PROBNP in the last 8760 hours. CBG: No results for input(s): GLUCAP in the last 168 hours. Studies: No results found.   Time spent: 35 minutes  Author: Berle Mull, MD [facility] 10/16/2018 4:21 PM  To reach On-call, see care teams to locate the attending and reach out to them via [email or web address]. If 7PM-7AM, please contact night-coverage If you still have difficulty reaching the attending provider, please page the [facility] (Director on Call) for [facility] on amion for assistance.

## 2018-10-16 NOTE — Progress Notes (Signed)
Physical Therapy Treatment Patient Details Name: Billy [name] MRN: [id number] DOB: Aug 09, 1934 Today's Date: 10/16/2018    History of Present Illness PAtient is a 84 y/o male who presents from outpatient appt with AMS. Found to have acute on chronic ARF in setting of cardiorenal syndrome. s/p thoracentesis 4/17. Ct Abdomen- RLL airspace disease abd right pleural effusion. PMh includes HTN, HLD, DM, CKD stage IV, obesity.    PT Comments    Patient continues to be agitated and confused. Mod to max A for bed mobility. Performed bed level therex, able to follow some cues for therex today.     Follow Up Recommendations  SNF;Supervision for mobility/OOB;Supervision/Assistance - 24 hour     Equipment Recommendations  None recommended by PT    Recommendations for Other Services       Precautions / Restrictions Precautions Precautions: Fall Precaution Comments: watch 02 Restrictions Weight Bearing Restrictions: No    Mobility  Bed Mobility Overal bed mobility: Needs Assistance Bed Mobility: Rolling;Sidelying to Sit;Sit to Supine Rolling: Max assist;+2 for physical assistance Sidelying to sit: Max assist;+2 for physical assistance;HOB elevated   Sit to supine: Mod assist;+2 for physical assistance;HOB elevated   General bed mobility comments: Assist with trunk, scooting bottom and LEs to get to EOB; assist to bring LEs to return to supine and lower trunk.  Transfers                 General transfer comment: Not attempted secondary to safety concerns, cognition andw eakness.   Ambulation/Gait                 Stairs             Wheelchair Mobility    Modified Rankin (Stroke Patients Only)       Balance Overall balance assessment: Needs assistance;History of Falls Sitting-balance support: Feet supported;Bilateral upper extremity supported Sitting balance-Leahy Scale: Poor Sitting balance - Comments: Requires BUE support sitting EOB, leaning on chair  in front of pt, close min guard-Min A due to restlessness Postural control: (anterior lean)                                  Cognition Arousal/Alertness: Lethargic Behavior During Therapy: Restless Overall Cognitive Status: Impaired/Different from baseline Area of Impairment: Orientation;Attention;Memory;Following commands;Awareness;Problem solving;Safety/judgement                 Orientation Level: Disoriented to;Time;Situation Current Attention Level: Selective;Sustained Memory: Decreased short-term memory Following Commands: Follows one step commands with increased time;Follows one step commands inconsistently Safety/Judgement: Decreased awareness of safety;Decreased awareness of deficits Awareness: Intellectual Problem Solving: Requires verbal cues        Exercises      General Comments        Pertinent Vitals/Pain Faces Pain Scale: Hurts little more Pain Location: stomach or any movement Pain Descriptors / Indicators: Guarding;Grimacing;Moaning;Discomfort    Home Living Family/patient expects to be discharged to:: Private residence Living Arrangements: Spouse/significant other Available Help at Discharge: Family;Available PRN/intermittently(daughter lives across the street per nurse) Type of Home: Mobile home Home Access: [place]: One level Home Equipment: Environmental consultant - 2 wheels;Walker - 4 wheels;Cane - single point;Shower seat - built in;Grab bars - tub/shower;Grab bars - toilet Additional Comments: Pt in unreliable historian, information taken from previous admission 1 month ago.    Prior Function Level of Independence: Needs assistance  Gait / Transfers Assistance Needed: uses SPC  for mobility in the home ADL's / Homemaking Assistance Needed: family and aide were helping with cleaning and food Comments: Pt in unreliable historian, information taken from previous admission 1 month ago.   PT Goals (current goals can now be  found in the care plan section) Acute Rehab PT Goals Patient Stated Goal: "help me" PT Goal Formulation: With patient Time For Goal Achievement: 10/28/18 Potential to Achieve Goals: Fair    Frequency    Min 2X/week      PT Plan      Co-evaluation              AM-PAC PT "6 Clicks" Mobility   Outcome Measure  Help needed turning from your back to your side while in a flat bed without using bedrails?: A Lot Help needed moving from lying on your back to sitting on the side of a flat bed without using bedrails?: A Lot Help needed moving to and from a bed to a chair (including a wheelchair)?: A Lot Help needed standing up from a chair using your arms (e.g., wheelchair or bedside chair)?: A Lot Help needed to walk in hospital room?: A Lot Help needed climbing 3-5 steps with a railing? : Total 6 Click Score: 11    End of Session Equipment Utilized During Treatment: Oxygen Activity Tolerance: Patient limited by lethargy;Other (comment);Patient limited by pain Patient left: in bed;with call bell/phone within reach;with bed alarm set Nurse Communication: Mobility status PT Visit Diagnosis: Muscle weakness (generalized) (M62.81);Difficulty in walking, not elsewhere classified (R26.2);Pain     Time: 7544-9201 PT Time Calculation (min) (ACUTE ONLY): 12 min  Charges:  $Therapeutic Activity: 8-22 mins                     Reinaldo Berber, PT, DPT Acute Rehabilitation Services Pager: [phone] Office: [phone]     Reinaldo Berber 10/16/2018, 12:21 PM

## 2018-10-16 NOTE — Progress Notes (Addendum)
Palliative:  HPI: 83 y.o. male  with past medical history of severe aortic stenosis, chronic kidney disease stage IV, diabetes, hypertension, CHF with preserved ejection fraction admitted on 10/09/2018 with altered mental status, shortness of breath, volume overload.  Creatinine on admission was 3.5, his potassium 5.7.  Was found to have a UTI.  CT of the chest performed on 10/11/2018 reflects 4 rib fractures on the right side, ribs 7, 8, 9, 10.  Patient also was found to have bilateral effusions and underwent thoracentesis on the right 1L on 10/11/2018. He has continued to decline over hospitalization with worsening agitation, confusion, and no improvement in renal function. Prognosis is grim.   I have spoken with daughter, [name]. Mr. Chiasson continues to decline with agitation, confusion, and now labored breathing. Overall prognosis is grim. I have recommended full comfort care with acknowledgement that his time is very limited. [name] is very tearful but agrees that we have done all that we can. She very much wants to take him home but I fear that is not very realistic and I have discussed with her about coming to visit with him here. I do not believe that he is stable to transfer home at this stage and they would not be able to care for him even with hospice. We can always readdress if he becomes more stable. I informed [name] that I will work to make her father more comfortable and peaceful and she will speak with the rest of her family. She says that he has 3 children and a sister that will want to visit and I educated of visit policy for 4 visitors when a patient is at EOL.   Exam: Alert, confused "help." He reaches for my hand. Respiratory distress with accessory muscle usage and tachypnea. Abd distended.   Plan: - Plans for transition to comfort measures and allow family visitation.  - SOB/pain: Dilaudid 1 mg IV every 30 min prn. May increase dosage/frequency as needed to achieve comfort.  - Agitation:  Ativan IV 1-2 mg every 2 hours prn.  - Minimizing medications for comfort.  - Likely hospital death but may consider home with hospice if he becomes more comfortable and stable if family continues to desire him to go home. This is not an option at this time.   15 min  Vinie Sill, NP Palliative Medicine Team Pager # [phone] (M-F 8a-5p) Team Phone # [phone] (Nights/Weekends)

## 2018-10-16 NOTE — Progress Notes (Signed)
[facility] Progress Note  Name: Billy [name] MRN: [id number] DOB: Dec 24, 1934  Chief Complaint:  Altered mental status  Subjective:  Patient altered and does not provide any history.  Has continued to be agitated per nursing.    Review of systems:  unable to obtain 2/2 AMS ----------------- Background on consult:  Billy [name] is a 84 y.o. male history of diabetes, hypertension, CKD stage IV with a baseline creatinine of 2.5-2.6 who presented after a follow-up video office visit with cardiology after a recent hospitalization.  He appeared sluggish and was sent for eval.  Palliative care had been consulted last admission as nephrology felt he was not a candidate for dialysis and cardiology felt he was not a candidate for intervention regarding aortic stenosis.  The patient was recently discharged from cardiology after an admission for overload.  Per the primary team initially he had been felt to be dehydrated and was hydrated and then resumed on half dose of his normal diuretics.  They have stopped the diuretics because his renal function worsened and gave IV fluids.  CT abdomen/pelvis with no renal obstruction and bilateral renal atrophy with a 5.7 cm mass concerning for possible renal cell.    Intake/Output Summary (Last 24 hours) at 10/16/2018 1243 Last data filed at 10/16/2018 0616 Gross per 24 hour  Intake 100 ml  Output 1100 ml  Net -1000 ml    Vitals:  Vitals:   10/15/18 1740 10/16/18 0616 10/16/18 0839 10/16/18 0901  BP: 127/62 (!) 121/52  (!) 142/50  Pulse: 88 84  85  Resp: 18 19  20   Temp: 98.6 F (37 C) 97.7 F (36.5 C)  97.7 F (36.5 C)  TempSrc: Oral Oral  Oral  SpO2: 93% 100% 95% 100%  Weight:      Height:         Physical Exam:  General chronically ill elderly male Neck supple trachea midline Lungs increased work of breathing; coarse breath sounds Heart S1S2; no rub Abdomen soft nontender nondistended Extremities no pitting  edema Nonverbal    Medications reviewed   Labs:  BMP Latest Ref Rng & Units 10/16/2018 10/15/2018 10/14/2018  Glucose 70 - 99 mg/dL 175(H) 255(H) 168(H)  BUN 8 - 23 mg/dL 133(H) 131(H) 126(H)  Creatinine 0.61 - 1.24 mg/dL 3.49(H) 3.42(H) 3.42(H)  Sodium 135 - 145 mmol/L 150(H) 144 143  Potassium 3.5 - 5.1 mmol/L 4.3 4.8 4.1  Chloride 98 - 111 mmol/L 112(H) 107 105  CO2 22 - 32 mmol/L 21(L) 19(L) 23  Calcium 8.9 - 10.3 mg/dL 9.0 8.8(L) 8.6(L)     Assessment/Plan:  # AKI; non-oliguric - s/p prerenal insults with optimizing volume status in the setting of CHF and severe aortic stenosis -Continue supportive care -Spoke with daughter on 4/21 to let her know of poor renal prognosis and that he is not a candidate for dialysis.    - Previously felt to be a poor long-term dialysis candidate and I agree.   - Recommend continued goals of care discussions and transition to comfort measures from a nephrology standpoint - Will defer further IV fluids with respiratory status   # CKD stage IV  - Baseline Cr reported as 2.5  # Severe AS  - Not felt to be a candidate for intervention per cardiology  # Acute hypoxic respiratory failure  - Continue supportive care with oxygen - s/p steroids per primary team  - Defer scheduled IV fluids   # Chronic diastolic CHF  - caution with IV  fluids  - Currently off of diuretics   # Renal mass - noted on CT a/p - Defer additional imaging at this time   # HTN  - acceptable  # Anemia  - Secondary in part to CKD  - no acute indication for transfusion   Claudia Desanctis, MD 10/16/2018 12:43 PM

## 2018-10-16 NOTE — Progress Notes (Signed)
Pt resting comfortably in bed with eyes closed. Bilateral  soft wrist restraints removed. Will continue to monitor.

## 2018-10-16 NOTE — Consult Note (Signed)
[place] Nurse wound follow up Patient receiving care in [facility] 5M13.  Today his mental status is significantly worse than when I first saw him on 10/11/18.  At the time of my assessment on 4/17 he was able to turn himself and have a conversation with me.  Today is in bilateral wrist restraints, unable to participate in turning, and yelling out. Wound type: The MASD to the abdomen, groin folds, scrotum and buttocks is significantly improved.  The draining areas on the right buttock and perineal area that are currently being dressed with Aquacel Ag and foam dressings, are beginning to declare themselves as unstageable wounds.  They are covered in grey slough. Measurement: I could not measure the areas due to the patient's inability to cooperate with positioning and being still. Wound bed: grey slough, otherwise intact skin. Drainage (amount, consistency, odor) heavy yellow Periwound: intact Dressing procedure/placement/frequency: I will begin santyl and saline moistened gauze daily to the grey slough wounds. Monitor the wound area(s) for worsening of condition such as: Signs/symptoms of infection,  Increase in size,  Development of or worsening of odor, Development of pain, or increased pain at the affected locations.  Notify the medical team if any of these develop.  Thank you for the consult.  Discussed plan of care with the patient and bedside nurse.  [place] nurse will not follow at this time.  Please re-consult the [place] team if needed.  Val Riles, RN, MSN, CWOCN, CNS-BC, pager [phone]

## 2018-10-16 NOTE — Progress Notes (Signed)
OT Cancellation Note  Patient Details Name: Billy [name] MRN: [id number] DOB: 1934-09-15   Cancelled Treatment:    Reason Eval/Treat Not Completed: Patient not medically ready(OT orders discontinued as pt EOL and comfort only.)   Darryl Nestle) Marsa Aris OTR/L Acute Rehabilitation Services Pager: [phone] Office: [place] 10/16/2018, 1:46 PM

## 2018-10-17 MED ORDER — HYDROMORPHONE HCL 1 MG/ML IJ SOLN
1.0000 mg | INTRAMUSCULAR | Status: AC | PRN
  Administered 2018-10-17: 1 mg via INTRAVENOUS
  Filled 2018-10-17: qty 1

## 2018-10-17 MED ORDER — LORAZEPAM 2 MG/ML IJ SOLN: 2.0000 mg | Freq: Four times a day (QID) | INTRAMUSCULAR | Status: DC

## 2018-10-17 MED ORDER — HYDROMORPHONE HCL 1 MG/ML IJ SOLN: 1.0000 mg | INTRAMUSCULAR | Status: DC | PRN

## 2018-10-17 MED ORDER — SODIUM CHLORIDE 0.9 % IV SOLN
1.0000 mg/h | INTRAVENOUS | Status: DC
  Administered 2018-10-17: 1 mg/h via INTRAVENOUS
  Filled 2018-10-17: qty 5

## 2018-10-17 MED ORDER — LORAZEPAM 2 MG/ML IJ SOLN: 2.0000 mg | Freq: Four times a day (QID) | INTRAMUSCULAR | 0 refills | Status: AC

## 2018-10-17 MED ORDER — LORAZEPAM 2 MG/ML IJ SOLN: 2.0000 mg | INTRAMUSCULAR | Status: DC | PRN

## 2018-10-17 MED ORDER — SODIUM CHLORIDE 0.9 % IV SOLN: 1.0000 mg/h | INTRAVENOUS | Status: DC

## 2018-10-17 MED ORDER — SODIUM CHLORIDE 0.9 % IV SOLN
INTRAVENOUS | Status: DC | PRN
  Administered 2018-10-17: 500 mL via INTRAVENOUS

## 2018-10-17 MED ORDER — HYDROMORPHONE BOLUS VIA INFUSION
1.0000 mg | INTRAVENOUS | Status: DC | PRN
  Administered 2018-10-17: 1 mg via INTRAVENOUS
  Filled 2018-10-17: qty 1

## 2018-10-17 NOTE — Progress Notes (Signed)
Nutrition Brief Note  Chart reviewed. Pt now transitioning to comfort care.  No further nutrition interventions warranted at this time.  Please re-consult as needed.   Breckyn Ticas RD, LDN Clinical Nutrition Pager # - [phone]    

## 2018-10-17 NOTE — Progress Notes (Signed)
Palliative Medicine RN Note:  Follow up. Pt having some apnea. Hospice d/c is pending pump arrival at hospice. Clarified with hospice liaison Cheri that no pump is needed at the facility and we can call for transport.  Dilaudid drip stopped pending transfer. It is most important to family to honor his wishes to get out of the hospital. Ambulance is expected in about 15 minutes. Updated daughter [name].  Marjie Skiff Darrel Baroni, RN, BSN, [facility] Palliative Medicine Team 10/17/2018 3:35 PM Office [phone]

## 2018-10-17 NOTE — Progress Notes (Signed)
[facility]  Pt has been approved by our Market researcher and family in agreement with comfort approach. Offered bed at [facility] and they have accepted. Webb Silversmith RN [phone]

## 2018-10-17 NOTE — Discharge Summary (Addendum)
[facility] Discharge Summary   Patient: Billy [name] NFA:[id number]   PCP: Patient, No Pcp Per DOB: [birth year]   Date of admission: 10/09/2018   Date of discharge:  10/17/2018    Discharge Diagnoses:  Principal Problem:   Acute respiratory failure with hypoxia ([employer] [facility] [employer]) Active Problems:   Type 2 diabetes mellitus ([place])   Hyperlipidemia   Essential hypertension, benign   Severe aortic stenosis   Hypertension   Acute renal failure superimposed on stage 4 chronic kidney disease (HCC)   Uremic encephalopathy   Cellulitis of scrotum   HCAP (healthcare-associated pneumonia)   Anemia   Pleural effusion   Parapneumonic effusion   Pressure injury of skin   Goals of care, counseling/discussion   Palliative care by specialist   Right-sided chest wall pain   Admitted From: home Disposition:  Hospice   Recommendations for Outpatient Follow-up:  1. Establish care with hospice.    Diet recommendation: comfort feeds   Activity: The patient is advised to gradually reintroduce usual activities.  Discharge Condition: stable  Code Status: DNR DNI comfort feeds  History of present illness: As per the H and P dictated on admission, "Billy [name] is a 84 y.o. male with medical history significant of CKD stage 4, HTN, DM that appears diet controlled.  Late last month he was admitted for CHF due to severe AS, diuresed, noted to have somewhat worsening renal function with diuresis.  Cardiology didn't feel he would be a good candidate for intervention regarding his AS.  Nephrology likewise didn't think he would be a candidate at all for long term dialysis.  [facility] spoke with patient, he apparently made it clear he wasn't interested in long term dialysis or heroic measures as their 3/21 note discusses.  He was discharged home with Palliative / Hospice care.  Seen by cardiology for video office visit (due to current [place] pandemic), on 4/10.  Cardiologist noted that he was quite "sluggish",  and noted "I am worried about potentially worsening of his kidney function with quite aggressive diuresis."  Chem 7 was ordered, obtained today.  And showed BUN in the 140s with creat of 3.5.  Patient sent in to ED."  Hospital Course:  Summary of his active problems in the hospital is as following. 1. Acute hypoxic respiratory failure. Healthcare associated pneumonia. Parapneumonic effusion. Presented with cough and shortness of breath was hypoxic on admission.  Chest x-ray showed right lower lobe pneumonia and parapneumonic effusion.  Influenza PCR was negative. S/P ultrasound-guided thoracentesis with 1 L of bloody fluid removed. Analysis consistent with exudate. Cultures so far negative. Blood cultures negative as well. CT chest abdomen pelvis was performed which showed right lower lobe pneumonia, multiple right rib fractures, no pneumothorax, gallstones. Started on IV diuresis as well as IV antibiotics. Started on DuoNeb's. PCCM was consulted recommended continuing antibiotics for 10 to 14 days. Patient remains hypoxic and agitated. Diuresis is on hold.  Due to worsening renal function Was on IV cefepime  2. Acute on chronic diastolic CHF. Severe aortic stenosis. Patient not a candidate for intervention by cardiologist. Continues to remain hypoxic. Given aggressive IV diuresis which was on hold due to worsening renal function. Poor prognosis.  3.  Acute kidney injury on chronic kidney disease stage IV. Combination of prerenal etiology likely from aggressive diuresis. As well as cardiorenal hemodynamics. Recently admitted and was seen by nephrology as well as palliative care. Renal function progressively worsened. Nephrology fellow the patient is not a candidate for dialysis last admission.  Currently given his worsening mentation as well as possible aspiration patient still deemed not a candidate for hemodialysis. Will not tolerated as well secondary to severe aortic  stenosis. CT abdomen negative for hydronephrosis. Foley catheter inserted. Nephrology recommends discussion with family regarding comfort care since the patient is not a dialysis candidate.  4.  Acute metabolic and toxic encephalopathy. Likely combination of uremia as well as polypharmacy with morphine, Wellbutrin, Zyprexa. Continues to remain agitated and confused. Significant change from his baseline. Holing oral meds,on scheduled ativan  5.  UTI. Cellulitis of the perineal area and scrotum. CT abdomen performed on admission was negative for Fournier's gangrene. Patient was given IV antibiotics with which there is improvement in his presentation. Treated with topical nystatin.  6.  Goals of care discussion. Palliative care was consulted. Current plan is to transition to comfort care. Anticipating Rapid decline after initiation of the comfort measures although trajectory not confirmed.  Pressure ulcer. On coccyx. Present on admission.  Pressure Injury 10/09/18 Unstageable - Full thickness tissue loss in which the base of the ulcer is covered by slough (yellow, tan, gray, green or brown) and/or eschar (tan, brown or black) in the wound bed. Unstageable wound above rectum (Active)  10/09/18 2330  Location: Coccyx  Location Orientation: Posterior;Medial  Staging: Unstageable - Full thickness tissue loss in which the base of the ulcer is covered by slough (yellow, tan, gray, green or brown) and/or eschar (tan, brown or black) in the wound bed.  Wound Description (Comments): Unstageable wound above rectum  Present on Admission: Yes   Consultants: Nephrology, palliative care, PCCM Procedures: none  DISCHARGE MEDICATION: Allergies as of 10/17/2018   No Known Allergies     Medication List    STOP taking these medications   amLODipine 5 MG tablet Commonly known as:  NORVASC   aspirin EC 81 MG tablet   atorvastatin 20 MG tablet Commonly known as:  LIPITOR   bisoprolol 5  MG tablet Commonly known as:  ZEBETA   buPROPion 150 MG 24 hr tablet Commonly known as:  WELLBUTRIN XL   doxazosin 4 MG tablet Commonly known as:  CARDURA   escitalopram 10 MG tablet Commonly known as:  LEXAPRO   ferrous sulfate 325 (65 FE) MG tablet   furosemide 80 MG tablet Commonly known as:  LASIX   glucose blood test strip Commonly known as:  Accu-Chek Aviva   multivitamin capsule   multivitamin with minerals Tabs tablet   nystatin cream Commonly known as:  MYCOSTATIN   potassium chloride SA 20 MEQ tablet Commonly known as:  K-DUR     TAKE these medications   LORazepam 2 MG/ML injection Commonly known as:  ATIVAN Inject 1 mL (2 mg total) into the vein every 6 (six) hours.      No Known Allergies Discharge Instructions    Increase activity slowly   Complete by:  As directed      Discharge Exam: Filed Weights   10/09/18 2300 10/10/18 2122 10/12/18 0500  Weight: 107 kg 106.3 kg (!) 144.7 kg   Vitals:   10/16/18 2051 10/17/18 0919  BP:  120/62  Pulse:  75  Resp:  16  Temp:  (!) 97.5 F (36.4 C)  SpO2: 96% 95%   General: Appear in mild distress, no Rash; Oral Mucosa dry. Cardiovascular: S1 and S2 Present, no Murmur, no JVD Respiratory: Bilateral Air entry present and bilateral  Crackles, no wheezes Abdomen: Bowel Sound present, Soft and no tenderness Extremities: no Pedal edema,  no calf tenderness Neurology: Grossly no focal neuro deficit.  The results of significant diagnostics from this hospitalization (including imaging, microbiology, ancillary and laboratory) are listed below for reference.    Significant Diagnostic Studies: Ct Abdomen Pelvis Wo Contrast  Result Date: 10/10/2018 CLINICAL DATA:  Hyperdense 13 mm lesion near the upper pole of the right kidney. EXAM: CT ABDOMEN AND PELVIS WITHOUT CONTRAST TECHNIQUE: Multidetector CT imaging of the abdomen and pelvis was performed following the standard protocol without IV contrast. COMPARISON:   Renal ultrasound 09/12/2018. FINDINGS: Lower chest: Right lower lobe airspace disease is present. A moderate right-sided pleural effusion is present. Heart size is normal. Atherosclerotic calcifications are present. Aortic valve calcifications are noted as well. Minimal atelectasis is present at the left base. Hepatobiliary: A 7 mm gallstone is present at the neck of the gallbladder. The gallbladder is somewhat distended without inflammatory change. The liver is unremarkable. Pancreas: Unremarkable. No pancreatic ductal dilatation or surrounding inflammatory changes. Spleen: Normal in size without focal abnormality. Adrenals/Urinary Tract: Adrenal glands are normal bilaterally. Kidneys are moderately atrophic. A 13 mm hyperdense lesion along the lateral aspect the right kidney was demonstrate to be cystic on the most recent study. A mass at the lower pole of the left kidney is again seen, measuring 5.7 x 4.2 cm. There are no stones. Vascular calcifications are present. No obstruction is present. Ureters are within normal limits bilaterally. A Foley catheter is present within the urinary bladder. Stomach/Bowel: The stomach and duodenum are within normal limits. The small bowel is unremarkable. The ascending and transverse colon are normal. The descending and sigmoid colon are normal. Moderate stool is present at the rectum without obstruction. Vascular/Lymphatic: Atherosclerotic calcifications are present in the aorta. No significant adenopathy is present. Reproductive: Prostate is unremarkable. Other: No abdominal wall hernia or abnormality. No abdominopelvic ascites. Musculoskeletal: Multilevel degenerative changes are present throughout the thoracolumbar spine. There is fusion across spinous processes in the lower thoracic spine. No focal lytic or blastic lesions are present. Bony pelvis is within normal limits. The hips are located. IMPRESSION: 1. No renal obstruction. 2. Bilateral renal atrophy. 3. 5.7 cm mass  lesion at the lower pole of the left kidney is indeterminate, but concerning for a renal cell carcinoma. MRI of the kidneys would be useful for further evaluation if the patient can tolerate the exam. 4. Right lower lobe pneumonia and pleural effusion. 5.  Aortic Atherosclerosis (ICD10-I70.0). Electronically Signed   By: San Morelle M.D.   On: 10/10/2018 04:14   Dg Chest 1 View  Result Date: 10/11/2018 CLINICAL DATA:  Status post thoracentesis EXAM: CHEST  1 VIEW COMPARISON:  10/11/2018 800 hours FINDINGS: There is no pneumothorax post thoracentesis. Right pleural effusion has improved. Bibasilar atelectasis persists. Low lung volumes. IMPRESSION: No pneumothorax post thoracentesis. Electronically Signed   By: Marybelle Killings M.D.   On: 10/11/2018 13:58   Ct Chest Wo Contrast  Result Date: 10/12/2018 CLINICAL DATA:  Hypoxia EXAM: CT CHEST WITHOUT CONTRAST TECHNIQUE: Multidetector CT imaging of the chest was performed following the standard protocol without IV contrast. COMPARISON:  10/11/2018 FINDINGS: Cardiovascular: Aortic calcifications are noted without aneurysmal dilatation. Coronary calcifications are seen. No enlargement of the pulmonary artery is noted. Mediastinum/Nodes: Thoracic inlet demonstrates changes consistent with goiter more prominent on the left than the right. Calcifications are noted within the thyroid gland bilaterally. No hilar or mediastinal adenopathy is noted. The esophagus as visualized is within normal limits. Lungs/Pleura: Patchy infiltrates are noted worst in the lower lobes  bilaterally with associated small effusion on the left and moderate effusion on the right. Upper Abdomen: Gallbladder is well distended with a dependent gallstone. The remainder of the upper abdomen is within normal limits. Musculoskeletal: Fractures of the seventh, eighth, ninth and tenth ribs on the right are noted. Degenerative changes of the thoracic spine are noted. IMPRESSION: Multiple  right-sided rib fractures.  No pneumothorax is noted. Bilateral pleural effusions right considerably greater than left with associated lower lobe consolidation right greater than left. Dependent gallstone. Aortic Atherosclerosis (ICD10-I70.0). Electronically Signed   By: Inez Catalina M.D.   On: 10/12/2018 02:25   Dg Chest Port 1 View  Result Date: 10/11/2018 CLINICAL DATA:  Shortness of breath EXAM: PORTABLE CHEST 1 VIEW COMPARISON:  October 09, 2018 FINDINGS: There is a right pleural effusion. There is atelectatic change in the lung bases. Heart is upper normal in size with pulmonary vascularity normal. No adenopathy. There is aortic atherosclerosis. No bone lesions. IMPRESSION: Right pleural effusion. Bibasilar atelectasis. It should be noted that a degree of consolidation on the right could be obscured by effusion. Stable cardiac silhouette. Aortic Atherosclerosis (ICD10-I70.0). Electronically Signed   By: Lowella Grip III M.D.   On: 10/11/2018 08:25   Ir Thoracentesis Asp Pleural Space W/img Guide  Result Date: 10/11/2018 INDICATION: Patient with right pleural effusion. Request is made for right diagnostic and therapeutic thoracentesis. EXAM: ULTRASOUND GUIDED RIGHT DIAGNOSTIC AND THERAPEUTIC THORACENTESIS MEDICATIONS: 10 mL 1% lidocaine COMPLICATIONS: None immediate. PROCEDURE: An ultrasound guided thoracentesis was thoroughly discussed with the patient and questions answered. The benefits, risks, alternatives and complications were also discussed. The patient understands and wishes to proceed with the procedure. Written consent was obtained. Ultrasound was performed to localize and mark an adequate pocket of fluid in the right chest. The area was then prepped and draped in the normal sterile fashion. 1% Lidocaine was used for local anesthesia. Under ultrasound guidance a 6 Fr Safe-T-Centesis catheter was introduced. Thoracentesis was performed. The catheter was removed and a dressing applied.  FINDINGS: A total of approximately 1.0 liters of bloody fluid was removed. Samples were sent to the laboratory as requested by the clinical team. IMPRESSION: Successful ultrasound guided diagnostic and therapeutic right thoracentesis yielding 1.0 of pleural fluid. Read by: Brynda Greathouse PA-C Electronically Signed   By: Markus Daft M.D.   On: 10/11/2018 14:02    Microbiology: Recent Results (from the past 240 hour(s))  Culture, Urine     Status: Abnormal   Collection Time: 10/09/18 11:56 PM  Result Value Ref Range Status   Specimen Description URINE, RANDOM  Final   Special Requests   Final    NONE Performed at [facility], [address] [place], [place] [address]    Culture 20,000 COLONIES/mL PROTEUS PENNERI (A)  Final   Report Status 10/12/2018 FINAL  Final   Organism ID, Bacteria PROTEUS PENNERI (A)  Final      Susceptibility   Proteus penneri - MIC*    AMPICILLIN >=32 RESISTANT Resistant     CEFAZOLIN >=64 RESISTANT Resistant     CEFTRIAXONE >=64 RESISTANT Resistant     CIPROFLOXACIN <=0.25 SENSITIVE Sensitive     GENTAMICIN <=1 SENSITIVE Sensitive     IMIPENEM 4 SENSITIVE Sensitive     NITROFURANTOIN 128 RESISTANT Resistant     TRIMETH/SULFA <=20 SENSITIVE Sensitive     AMPICILLIN/SULBACTAM >=32 RESISTANT Resistant     PIP/TAZO <=4 SENSITIVE Sensitive     * 20,000 COLONIES/mL PROTEUS PENNERI  Culture,  blood (routine x 2)     Status: None   Collection Time: 10/10/18 12:15 AM  Result Value Ref Range Status   Specimen Description BLOOD RIGHT HAND  Final   Special Requests   Final    BOTTLES DRAWN AEROBIC AND ANAEROBIC Blood Culture adequate volume   Culture   Final    NO GROWTH 5 DAYS Performed at [facility], [address] [place], [place] [address]    Report Status 10/15/2018 FINAL  Final  Culture, blood (routine x 2)     Status: None   Collection Time: 10/10/18 12:15 AM  Result Value Ref Range Status   Specimen Description BLOOD LEFT HAND  Final    Special Requests   Final    BOTTLES DRAWN AEROBIC ONLY Blood Culture results may not be optimal due to an excessive volume of blood received in culture bottles   Culture   Final    NO GROWTH 5 DAYS Performed at [facility], [place] [address] [place], [place] [address]    Report Status 10/15/2018 FINAL  Final  MRSA PCR Screening     Status: Abnormal   Collection Time: 10/10/18  2:10 AM  Result Value Ref Range Status   MRSA by PCR POSITIVE (A) NEGATIVE Final    Comment:        The GeneXpert MRSA Assay (FDA approved for NASAL specimens only), is one component of a comprehensive MRSA colonization surveillance program. It is not intended to diagnose MRSA infection nor to guide or monitor treatment for MRSA infections. GENGLER,K RN 10/10/2018 AT 0354 SKEEN,P Performed at [facility], [place] [address] [place], [place] [address]   Gram stain     Status: None   Collection Time: 10/11/18  1:50 PM  Result Value Ref Range Status   Specimen Description FLUID PLEURAL RIGHT  Final   Special Requests NONE  Final   Gram Stain   Final    FEW WBC PRESENT,BOTH PMN AND MONONUCLEAR NO ORGANISMS SEEN Performed at [facility], [place] [address], [place], [place] [address]    Report Status 10/11/2018 FINAL  Final  Culture, body fluid-bottle     Status: None   Collection Time: 10/11/18  1:50 PM  Result Value Ref Range Status   Specimen Description FLUID PLEURAL RIGHT  Final   Special Requests BOTTLES DRAWN AEROBIC AND ANAEROBIC  Final   Culture   Final    NO GROWTH 5 DAYS Performed at [facility], [place] [address], [place],  [address]    Report Status 10/16/2018 FINAL  Final     Labs: CBC: Recent Labs  Lab 10/11/18 0651  10/12/18 0450 10/13/18 0612 10/14/18 0623 10/15/18 0625 10/16/18 0527  WBC 12.8*  --  13.3* 11.1* 10.8* 11.7* 13.6*  NEUTROABS 11.3*  --  11.9* 9.5*  --  11.1* 12.0*  HGB 7.4*   < > 7.3* 7.8* 7.8* 8.0* 8.0*  HCT 24.0*   < > 23.9* 26.7*  26.7* 27.8* 27.8*  MCV 86.6  --  87.2 88.7 89.3 91.1 92.1  PLT 155  --  154 134* 185 195 214   < > = values in this interval not displayed.   Basic Metabolic Panel: Recent Labs  Lab 10/12/18 0450 10/13/18 0612 10/14/18 0623 10/15/18 0625 10/16/18 0527  NA 139 139 143 144 150*  K 3.7 4.1 4.1 4.8 4.3  CL 100 103 105 107 112*  CO2 24 22 23  19* 21*  GLUCOSE 182* 171* 168* 255* 175*  BUN 120* 124* 126* 131* 133*  CREATININE 2.88* 3.15* 3.42* 3.42* 3.49*  CALCIUM 8.3* 8.1* 8.6* 8.8* 9.0  MG 2.7*  --   --   --   --   PHOS 4.3 4.7* 5.1* 5.6* 5.1*   Liver Function Tests: Recent Labs  Lab 10/11/18 0651 10/12/18 0450 10/13/18 0612 10/14/18 0623 10/15/18 0625 10/16/18 0527  AST 35  --   --   --   --   --   ALT 25  --   --   --   --   --   ALKPHOS 84  --   --   --   --   --   BILITOT 1.0  --   --   --   --   --   PROT 5.1*  --   --   --   --   --   ALBUMIN 2.0* 1.9* 1.9* 1.9* 2.0* 2.2*   No results for input(s): LIPASE, AMYLASE in the last 168 hours. Recent Labs  Lab 10/15/18 1142  AMMONIA 24   Cardiac Enzymes: No results for input(s): CKTOTAL, CKMB, CKMBINDEX, TROPONINI in the last 168 hours. BNP (last 3 results) No results for input(s): BNP in the last 8760 hours. CBG: No results for input(s): GLUCAP in the last 168 hours. Time spent: 35 minutes  Signed:  Berle Mull  [facility]  10/17/2018

## 2018-10-17 NOTE — Progress Notes (Signed)
Patient left via transport. 

## 2018-10-17 NOTE — Progress Notes (Signed)
Palliative Medicine RN Note: Rec'd update from [facility]; pt will go today. D/C summary is in. I spoke with RN Urban Gibson, who has already called report. Confirmed she has an order that will cover Mr Burges for comfort for transport.  Marjie Skiff Sally-Anne Wamble, RN, BSN, [facility] Palliative Medicine Team 10/17/2018 1:09 PM Office [phone]

## 2018-10-17 NOTE — Progress Notes (Signed)
Palliative Medicine RN Note: Symptom check. Patient got hydromorphone about 1h24m before I arrived. Billy [name] is in bed, pulling at O2 and rails, agitated, unable to be consoled, screaming "help!". Spoke with RN Urban Gibson, who will bring prn hydromorphone and lorazepam.   Discussed with Dr Hilma Favors, who started a Dilaudid infusion and added scheduled Ativan. Ensured that Urban Gibson knows how to reach PMT. Called daughter [name] to provide update. She wants to come visit, but she is a high risk patient and is concerned for her health and cannot visit.   [name] is very interested in Billy Kauzlarich going to a hospice facility; she specifically requested [facility]. At this time, I feel like Billy Morini is stable for transfer today, and that window may be very short. He would not require a dilaudid infusion if he will be managed by a hospice team, and Dr Hilma Favors anticipates he can transition off the drip if he is able to be transferred to hospice. Prognosis is likely days, and he is having uncontrolled agitation and pain.   I spoke with the liaison for [facility] regarding a bed at [facility]. She is hopeful he could have a bed today, and she will reach out to the family. I have updated Dr Posey Pronto and SW Lorriane Shire.  Marjie Skiff Gerda Yin, RN, BSN, [facility] Palliative Medicine Team 10/17/2018 11:15 AM Office [phone]

## 2018-10-17 NOTE — Care Management Important Message (Signed)
Important Message  Patient Details  Name: Billy [name] MRN: [id number] Date of Birth: 28-Apr-1935   Medicare Important Message Given:  Yes    Billy [name] 10/17/2018, 2:35 PM

## 2018-10-17 NOTE — Progress Notes (Signed)
Report called Anderson Malta, RN at [facility]. Waiting for them to confirm they IV pump for Mr. Sabia's Dilaudid gtt before calling transport

## 2018-10-17 NOTE — TOC Initial Note (Signed)
Transition of Care [name]) - Initial/Assessment Note    [name] Details  Billy [name] MRN: [id number] Date of Birth: 1934-09-08  Transition of Care [name]) CM/SW Contact:    Billy Feil, LCSW Phone Number: 10/17/2018, 1:25 PM  Clinical Narrative: CSW contacted by Billy [name] with Palliative Care regarding [name] being comfort care and daughters requesting Billy [name]. Per Billy [name], referral already made to Billy [name] and they do have a bed today.   Received a call later from Billy [name] with Hospice informing CSW that [name] can discharge to Billy [name] today. Billy number for report is [phone].              Expected Discharge Plan: Skilled Nursing Facility(PT recommended SNF or home with 24/7 care and Billy [name] services) Barriers to Discharge: No Barriers Identified   [name] Goals and CMS Choice [name] states their goals for this hospitalization and ongoing recovery are:: No goals stated by [name].(Per Billy [name] with Palliative Care on 4/18, family's goals are to continue to treat Billy treatable with care limits of DNR/DNI, no hemodialysis set) CMS Medicare.gov Compare Post Acute Care list provided to:: Other (Comment Required) Choice offered to / list presented to : Adult [name](Hospice choice offered to daughter and request was for [name])  Expected Discharge Plan and Services Expected Discharge Plan: Skilled Nursing Facility(PT recommended SNF or home with 24/7 care and Billy [name] - [place] services) In-house Referral: Clinical Social Work Discharge Planning Services: Other - See comment(Hospice placement) Post Acute Care Choice: Hospice(Hospice facility) Living arrangements for Billy past 2 months: Single Family Home Expected Discharge Date: 10/17/18               DME Arranged: N/A DME Agency: NA Date DME Agency Contacted: (N/A)   Representative spoke with at DME Agency: (N/A) HH Arranged: NA       Representative spoke with at [place]: (N/A)  Prior  Living Arrangements/Services Living arrangements for Billy past 2 months: Single Family Home Lives with:: Adult [name](Lives with one of his daughters) [name] language and need for interpreter reviewed:: No Do you feel safe going back to Billy place where you live?: No      Need for Family Participation in [name] Care: Yes (Comment) Care giver support system in place?: Yes (comment) Current home services: Home OT, Home PT, Home RN, Other (comment)(Home health services including SW through Billy [name]. Oxygen through Billy [name]) Criminal Activity/Legal Involvement Pertinent to Current Situation/Hospitalization: No - Comment as needed  Activities of Daily Living Home Assistive Devices/Equipment: None ADL Screening (condition at time of admission) [name]'s cognitive ability adequate to safely complete daily activities?: No Is Billy [name] deaf or have difficulty hearing?: No Does Billy [name] have difficulty seeing, even when wearing glasses/contacts?: No Does Billy [name] have difficulty concentrating, remembering, or making decisions?: No [name] able to express need for assistance with ADLs?: Yes Does Billy [name] have difficulty dressing or bathing?: No Independently performs ADLs?: No Does Billy [name] have difficulty walking or climbing stairs?: No Weakness of Legs: Both Weakness of Arms/Hands: Both  Permission Sought/Granted Permission sought to share information with : Other (comment)([name] unable to give consent; contacted made with daughter) Permission granted to share information with : No([name] unable to give consent)  Share Information with Billy [name]     Permission granted to share info w Relationship: Daughter  Permission granted to share info w Contact Information: [phone] (mobile)  Emotional Assessment Appearance:: Other (Comment Required(Did not visit with [name], contact made by Palliative with daughter  Billy [name]) Attitude/Demeanor/Rapport: Unable  to Assess Affect (typically observed): Unable to Assess Orientation: : (Disoriented X4) Alcohol / Substance Use: Tobacco Use, Alcohol Use, Illicit Drugs(Per H&P, [name] quit smoking, has previous alcohol use and does not use illicit drugs) Psych Involvement: No (comment)  Admission diagnosis:  ACUTE RENAL FAILURE HYPERKALEMIA [name] Active Problem List   Diagnosis Date Noted  . Goals of care, counseling/discussion   . Palliative care by specialist   . Right-sided chest wall pain   . Acute respiratory failure with hypoxia ([place]) 10/11/2018  . Pleural effusion 10/11/2018  . Pressure injury of skin 10/11/2018  . Parapneumonic effusion   . Cellulitis of scrotum 10/10/2018  . HCAP (healthcare-associated pneumonia) 10/10/2018  . Anemia 10/10/2018  . Acute renal failure superimposed on stage 4 chronic kidney disease ([place]) 10/09/2018  . Uremic encephalopathy 10/09/2018  . Hypertension 09/16/2018  . Chronic kidney disease (CKD), stage IV (severe) ([place]) 09/16/2018  . Acute on chronic diastolic heart failure ([place]) 09/15/2018  . Severe aortic stenosis 09/11/2018  . Type 2 diabetes mellitus ([place]) 07/23/2013  . Multinodular goiter 07/23/2013  . Hyperlipidemia 07/23/2013  . Essential hypertension, benign 07/23/2013  . Depression, endogenous ([place]) 07/23/2013  . Urinary incontinence, post-void dribbling 07/23/2013   PCP:  [name], No Pcp Per Pharmacy:   [facility] [place], [place] - [address] AT [address] [place] [address] Phone: [phone] Fax: [phone]   Social Determinants of Health (SDOH) Interventions  No interventions needed at this time.  Readmission Risk Interventions No flowsheet data found.

## 2018-10-17 NOTE — TOC Transition Note (Signed)
Transition of Care (TOC) - CM/SW Discharge Note **10/17/18 - DISCHARGED TO [facility]   Patient Details  Name: Billy [name] MRN: [id number] Date of Birth: [birth year]  Transition of Care [facility]) CM/SW Contact:  Sable Feil, LCSW Phone Number: 10/17/2018, 5:25 PM   Clinical Narrative:  Patient discharged to [facility]. Call made to daughter [name] ([phone]) and advised her that patient was in route to facility. Per daughter, she had been to hospice facility to complete paperwork and they will call her once her dad arrives.     Final next level of care: Hospice Medical Facility([facility], [place]) Barriers to Discharge: No Barriers Identified   Patient Goals and CMS Choice Patient states their goals for this hospitalization and ongoing recovery are:: No goals stated by patient.(Per [name] with Palliative Care on 4/18, family's goals are to continue to treat the treatable with care limits of DNR/DNI, no hemodialysis set) CMS Medicare.gov Compare Post Acute Care list provided to:: Other (Comment Required) Choice offered to / list presented to : Adult Children(Hospice choice offered to daughter and request was for [facility])  Discharge Placement  [facility]                     Discharge Plan and Services In-house Referral: Clinical Social Work Discharge Planning Services: Other - See comment(Hospice placement) Post Acute Care Choice: Hospice(Hospice facility)          DME Arranged: N/A DME Agency: NA Date DME Agency Contacted: (N/A)   Representative spoke with at DME Agency: (N/A) HH Arranged: NA       Representative spoke with at [place]: (N/A)  Social Determinants of Health (SDOH) Interventions  None needed   Readmission Risk Interventions No flowsheet data found.

## 2018-10-25 ENCOUNTER — Telehealth: Payer: Medicare HMO | Admitting: Cardiology

## 2018-10-25 DEATH — deceased

## 2020-05-08 IMAGING — CT CT ABDOMEN AND PELVIS WITHOUT CONTRAST
2 of 7 series · 15 of 46 positions shown, 17 images · non-contrast
Comparison: Renal ultrasound 09/12/2018.

CLINICAL DATA: Hyperdense 13 mm lesion near the upper pole of the
right kidney.

EXAM:
CT ABDOMEN AND PELVIS WITHOUT CONTRAST
TECHNIQUE: Multidetector CT imaging of the abdomen and pelvis was performed
following the standard protocol without IV contrast.

[Series 3: a/p w/o 5mm · axial · non-contrast · 0.98mm/px · z∈[+913,+1398]mm · 12 of 109 slices shown, 14 images]
[im 6/109  soft-tissue]
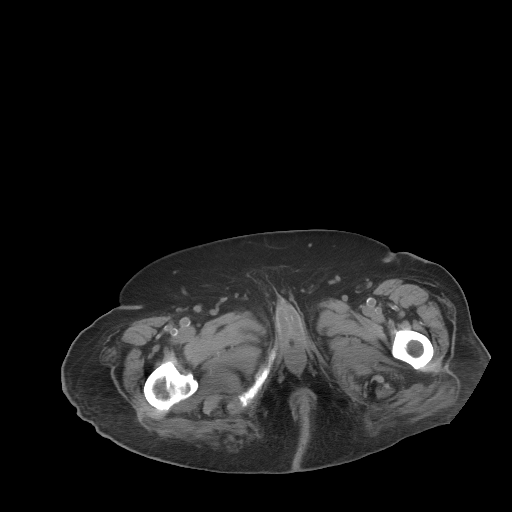
[im 6/109  bone]
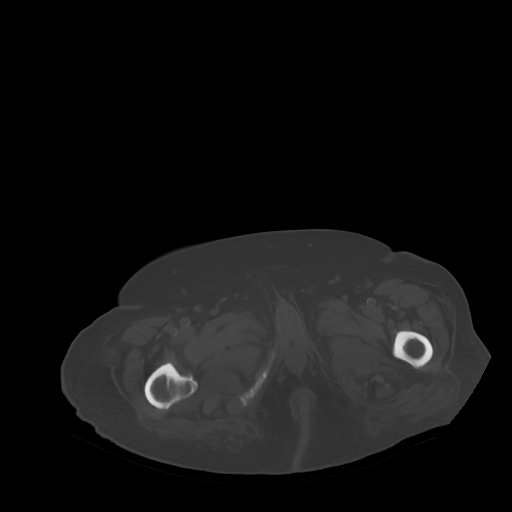
[im 17/109  soft-tissue]
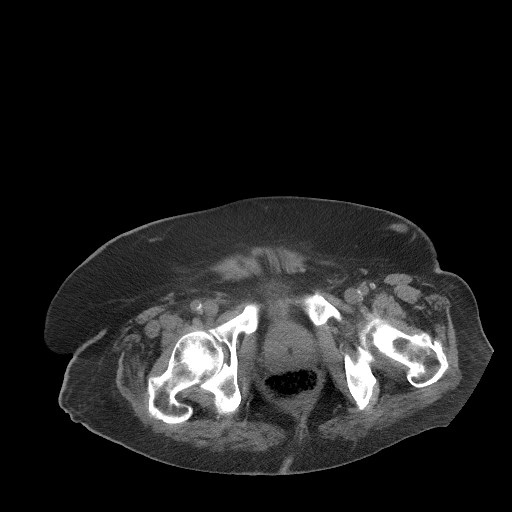
[im 22/109  soft-tissue]
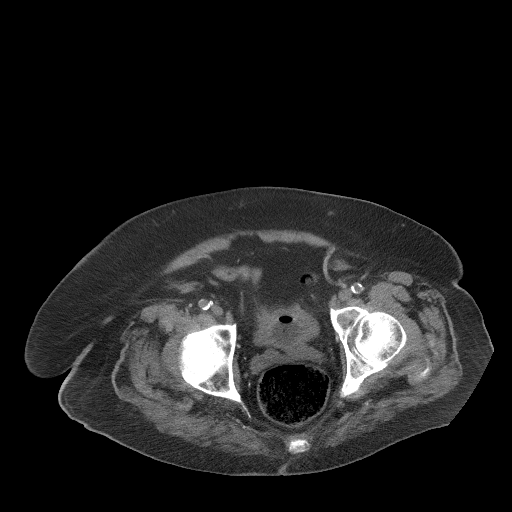
[im 33/109  soft-tissue]
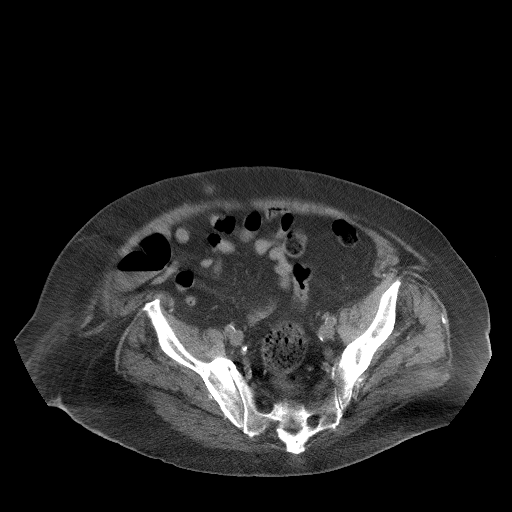
[im 44/109  soft-tissue]
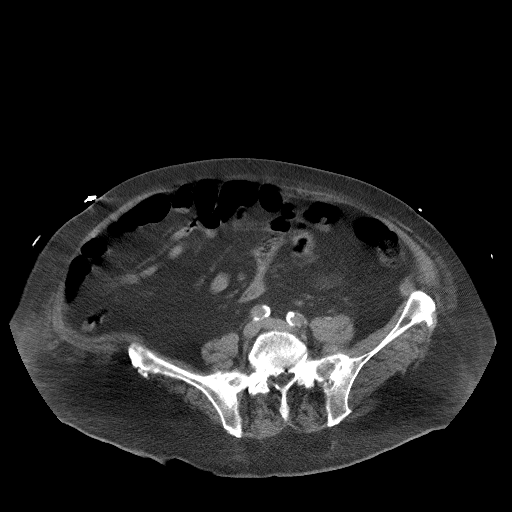
[im 49/109  soft-tissue]
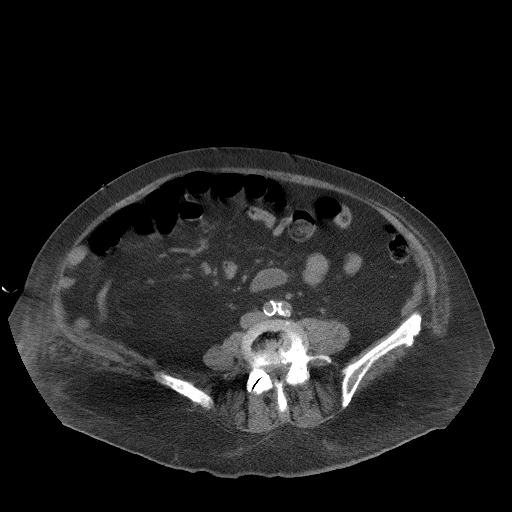
[im 60/109  soft-tissue]
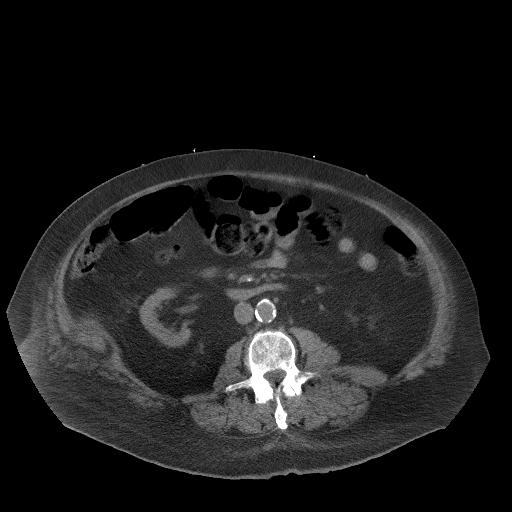
[im 65/109  soft-tissue]
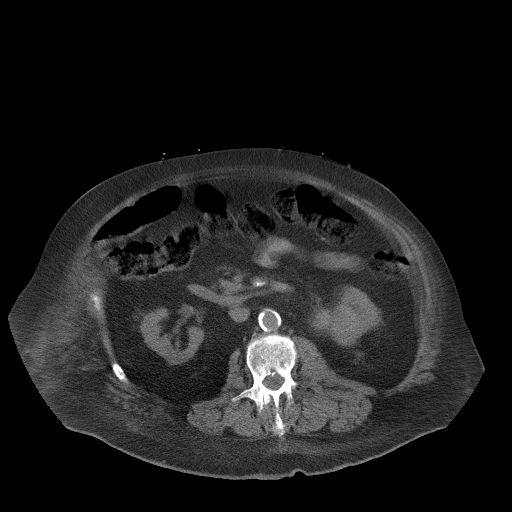
[im 76/109  soft-tissue]
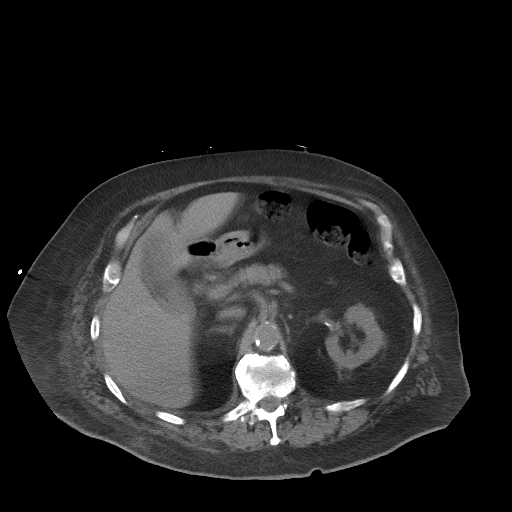
[im 76/109  bone]
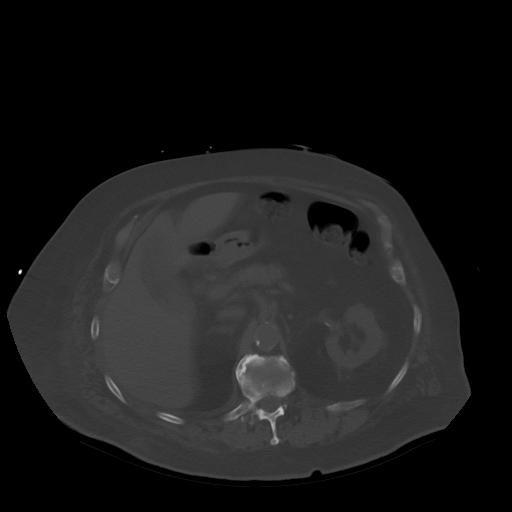
[im 87/109  soft-tissue]
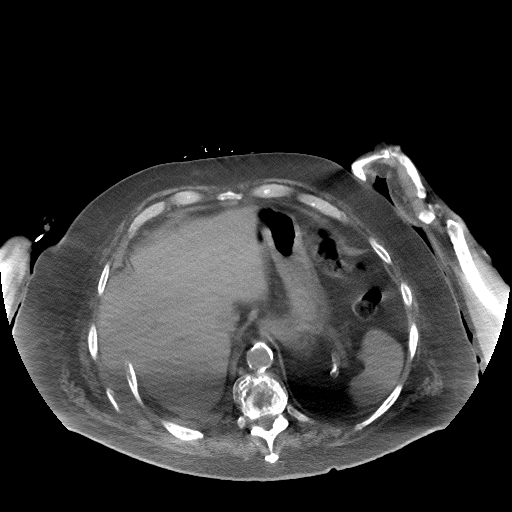
[im 92/109  soft-tissue]
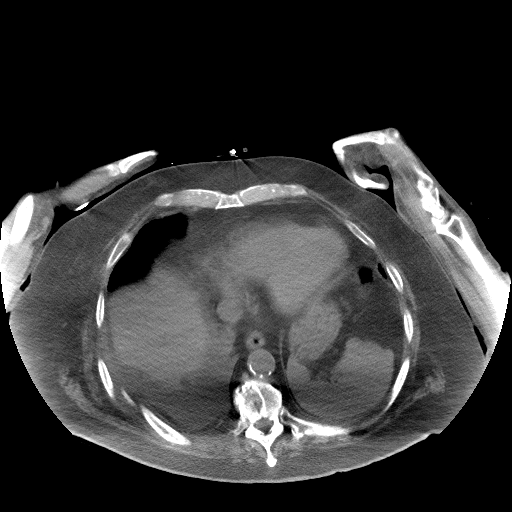
[im 103/109  soft-tissue]
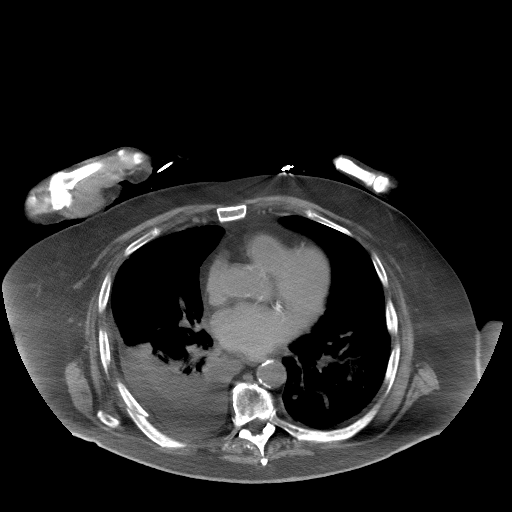

[Series 6: a/p w/o cor · coronal · non-contrast · 0.84mm/px · 3 of 169 slices shown]
[im 34/169  soft-tissue]
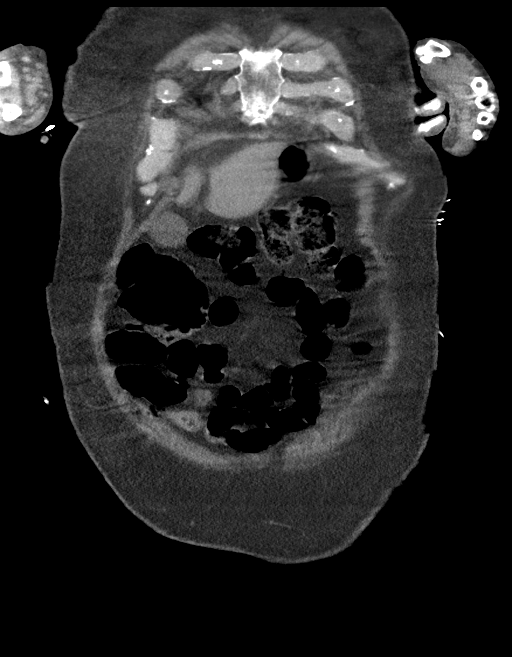
[im 68/169  soft-tissue]
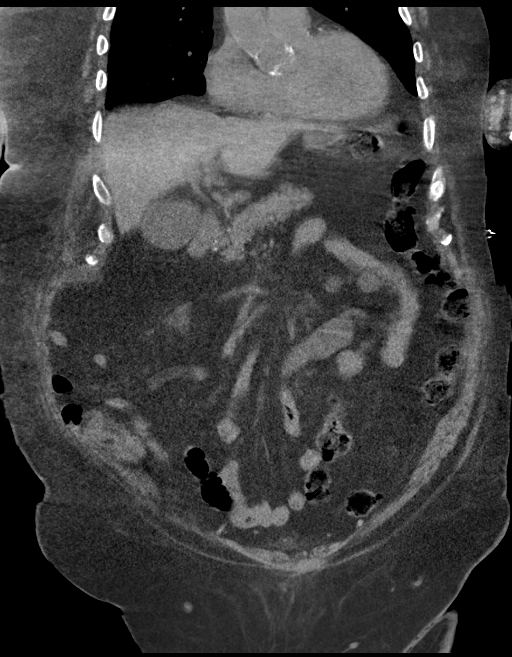
[im 101/169  soft-tissue]
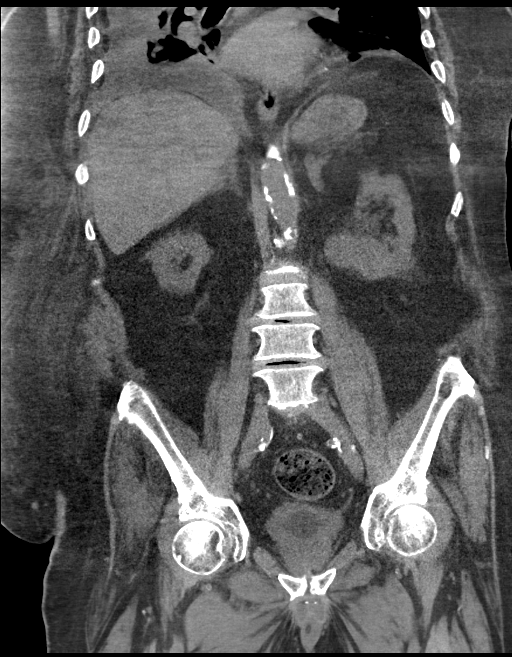

[15 of 46 positions shown; findings below may reference images not displayed]

FINDINGS: Lower chest: Right lower lobe airspace disease is present. A
moderate right-sided pleural effusion is present. Heart size is
normal. Atherosclerotic calcifications are present. Aortic valve
calcifications are noted as well. Minimal atelectasis is present at
the left base.

Hepatobiliary: A 7 mm gallstone is present at the neck of the
gallbladder. The gallbladder is somewhat distended without
inflammatory change. The liver is unremarkable.

Pancreas: Unremarkable. No pancreatic ductal dilatation or
surrounding inflammatory changes.

Spleen: Normal in size without focal abnormality.

Adrenals/Urinary Tract: Adrenal glands are normal bilaterally.
Kidneys are moderately atrophic. A 13 mm hyperdense lesion along the
lateral aspect the right kidney was demonstrate to be cystic on the
most recent study. A mass at the lower pole of the left kidney is
again seen, measuring 5.7 x 4.2 cm. There are no stones. Vascular
calcifications are present. No obstruction is present. Ureters are
within normal limits bilaterally. A Foley catheter is present within
the urinary bladder.

Stomach/Bowel: The stomach and duodenum are within normal limits.
The small bowel is unremarkable. The ascending and transverse colon
are normal. The descending and sigmoid colon are normal. Moderate
stool is present at the rectum without obstruction.

Vascular/Lymphatic: Atherosclerotic calcifications are present in
the aorta. No significant adenopathy is present.

Reproductive: Prostate is unremarkable.

Other: No abdominal wall hernia or abnormality. No abdominopelvic
ascites.

Musculoskeletal: Multilevel degenerative changes are present
throughout the thoracolumbar spine. There is fusion across spinous
processes in the lower thoracic spine. No focal lytic or blastic
lesions are present. Bony pelvis is within normal limits. The hips
are located.
IMPRESSION: 1. No renal obstruction.
2. Bilateral renal atrophy.
3. 5.7 cm mass lesion at the lower pole of the left kidney is
indeterminate, but concerning for a renal cell carcinoma. MRI of the
kidneys would be useful for further evaluation if the patient can
tolerate the exam.
4. Right lower lobe pneumonia and pleural effusion.
5.  Aortic Atherosclerosis ([id number]).

## 2020-05-09 IMAGING — DX CHEST  1 VIEW
1 series · 1 of 1 positions shown · non-contrast
Comparison: 10/11/2018 [DATE] hours

CLINICAL DATA: Status post thoracentesis

EXAM:
CHEST  1 VIEW

[x chest ap]
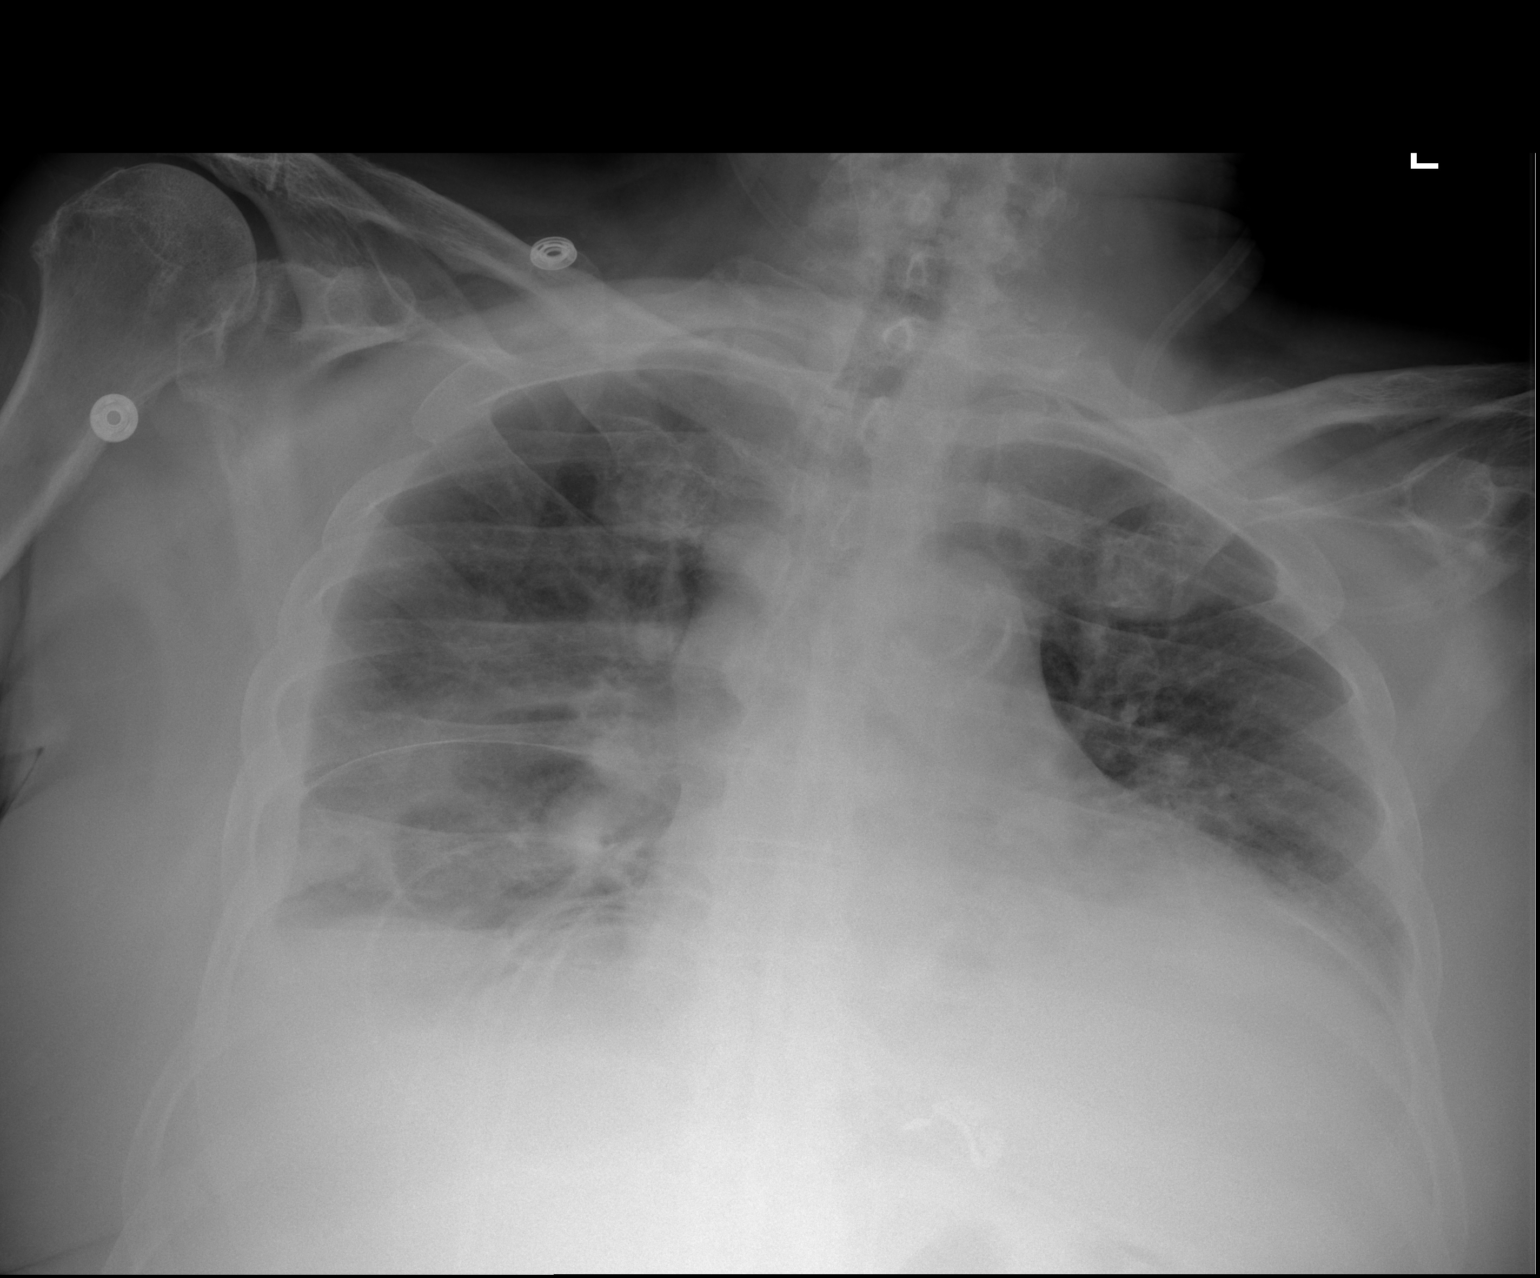

[1 of 1 positions shown; findings below may reference images not displayed]

FINDINGS: There is no pneumothorax post thoracentesis. Right pleural effusion
has improved. Bibasilar atelectasis persists. Low lung volumes.
IMPRESSION: No pneumothorax post thoracentesis.

## 2020-05-09 IMAGING — DX PORTABLE CHEST - 1 VIEW
1 series · 1 of 1 positions shown · non-contrast
Comparison: October 09, 2018

CLINICAL DATA: Shortness of breath

EXAM:
PORTABLE CHEST 1 VIEW

[chest ap]
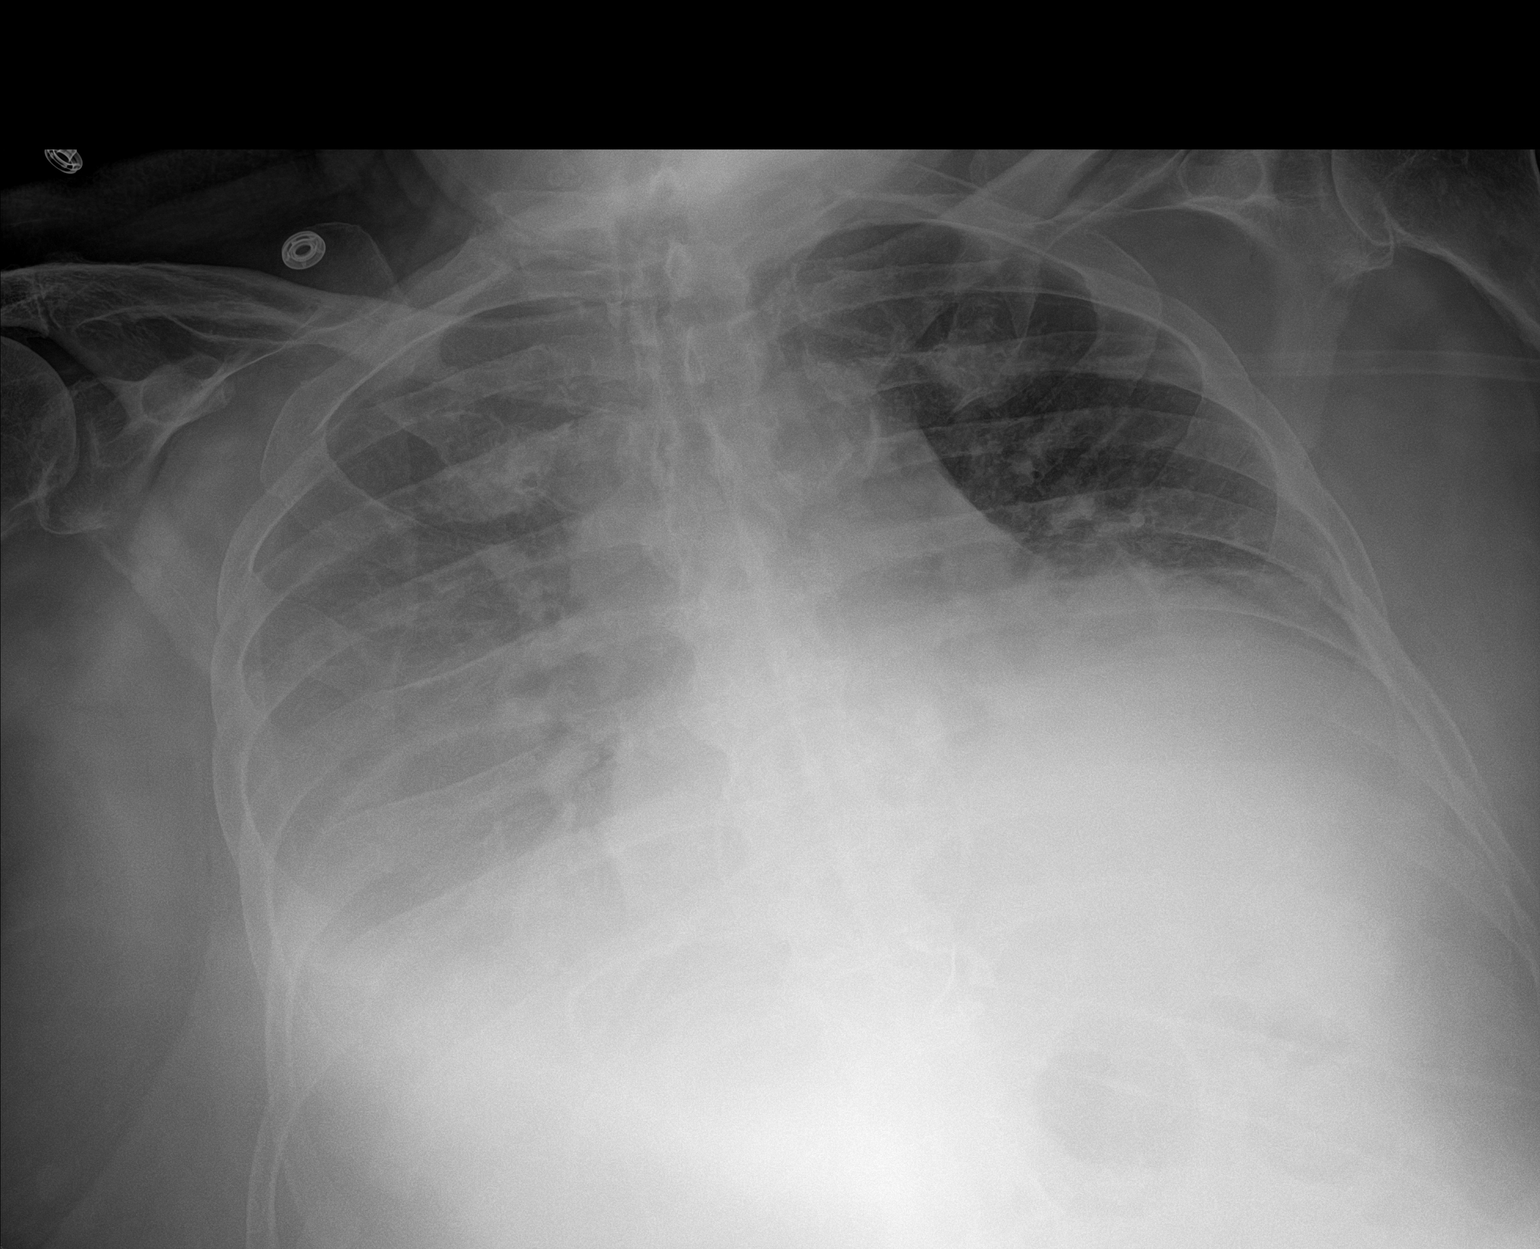

[1 of 1 positions shown; findings below may reference images not displayed]

FINDINGS: There is a right pleural effusion. There is atelectatic change in
the lung bases. Heart is upper normal in size with pulmonary
vascularity normal. No adenopathy. There is aortic atherosclerosis.
No bone lesions.
IMPRESSION: Right pleural effusion. Bibasilar atelectasis. It should be noted
that a degree of consolidation on the right could be obscured by
effusion. Stable cardiac silhouette. Aortic Atherosclerosis
([id number]).
